# Patient Record
Sex: Male | Born: 1935 | Race: Black or African American | Hispanic: No | Marital: Married | Smoking: Current every day smoker
Health system: Southern US, Community
[De-identification: ages and names within clinical notes are randomized; demographics above are authoritative.]

## PROBLEM LIST (undated history)

## (undated) DIAGNOSIS — I10 Essential (primary) hypertension: Secondary | ICD-10-CM

## (undated) DIAGNOSIS — C3490 Malignant neoplasm of unspecified part of unspecified bronchus or lung: Secondary | ICD-10-CM

## (undated) DIAGNOSIS — Z923 Personal history of irradiation: Secondary | ICD-10-CM

## (undated) DIAGNOSIS — J449 Chronic obstructive pulmonary disease, unspecified: Secondary | ICD-10-CM

## (undated) DIAGNOSIS — B029 Zoster without complications: Secondary | ICD-10-CM

## (undated) DIAGNOSIS — Z720 Tobacco use: Secondary | ICD-10-CM

## (undated) DIAGNOSIS — D649 Anemia, unspecified: Secondary | ICD-10-CM

## (undated) HISTORY — DX: Personal history of irradiation: Z92.3

## (undated) HISTORY — PX: APPENDECTOMY: SHX54

## (undated) HISTORY — DX: Zoster without complications: B02.9

## (undated) HISTORY — DX: Tobacco use: Z72.0

## (undated) HISTORY — DX: Essential (primary) hypertension: I10

---

## 2010-06-16 DIAGNOSIS — B029 Zoster without complications: Secondary | ICD-10-CM

## 2010-06-16 HISTORY — DX: Zoster without complications: B02.9

## 2010-08-08 LAB — TSH: TSH: 1.36 u[IU]/mL (ref <=5.90)

## 2010-08-08 LAB — LIPID PANEL: Cholesterol: 194 mg/dL (ref 0–200)

## 2011-02-06 LAB — LIPID PANEL
Cholesterol: 172 mg/dL (ref 0–200)
HDL: 48 mg/dL (ref 35–70)
Triglycerides: 84 mg/dL (ref 40–160)

## 2011-08-14 LAB — LIPID PANEL
HDL: 56 mg/dL (ref 35–70)
LDL Cholesterol: 110 mg/dL

## 2011-08-14 LAB — TSH: TSH: 1.67 u[IU]/mL (ref <=5.90)

## 2011-10-02 ENCOUNTER — Ambulatory Visit: Payer: Self-pay | Admitting: Family Medicine

## 2011-11-20 ENCOUNTER — Ambulatory Visit: Payer: Self-pay | Admitting: Family Medicine

## 2011-12-09 ENCOUNTER — Encounter: Payer: Self-pay | Admitting: Family Medicine

## 2011-12-11 ENCOUNTER — Encounter: Payer: Self-pay | Admitting: Family Medicine

## 2011-12-11 ENCOUNTER — Ambulatory Visit (INDEPENDENT_AMBULATORY_CARE_PROVIDER_SITE_OTHER): Payer: Medicare Other | Admitting: Family Medicine

## 2011-12-11 VITALS — BP 162/95 | HR 106 | Temp 97.1°F | Resp 20 | Ht 68.0 in | Wt 142.4 lb

## 2011-12-11 DIAGNOSIS — Z0000 Encounter for general adult medical examination without abnormal findings: Secondary | ICD-10-CM

## 2011-12-11 DIAGNOSIS — I10 Essential (primary) hypertension: Secondary | ICD-10-CM

## 2011-12-11 DIAGNOSIS — K648 Other hemorrhoids: Secondary | ICD-10-CM

## 2011-12-11 DIAGNOSIS — B029 Zoster without complications: Secondary | ICD-10-CM

## 2011-12-11 DIAGNOSIS — Z720 Tobacco use: Secondary | ICD-10-CM | POA: Insufficient documentation

## 2011-12-11 DIAGNOSIS — Z8619 Personal history of other infectious and parasitic diseases: Secondary | ICD-10-CM

## 2011-12-11 LAB — PSA: PSA: 10.63 ng/mL — ABNORMAL HIGH (ref <=4.00)

## 2011-12-11 MED ORDER — ASPIRIN 81 MG PO TABS: 81.0000 mg | ORAL_TABLET | Freq: Every day | ORAL | Status: AC

## 2011-12-11 MED ORDER — AMLODIPINE BESYLATE 5 MG PO TABS: 5.0000 mg | ORAL_TABLET | Freq: Every day | ORAL | Status: DC

## 2011-12-11 MED ORDER — HYDROCHLOROTHIAZIDE 12.5 MG PO CAPS: 12.5000 mg | ORAL_CAPSULE | Freq: Every day | ORAL | Status: DC

## 2011-12-11 NOTE — Progress Notes (Signed)
  Subjective:    Patient ID: Cameron Lam, male    DOB: 1933/12/19, 78 y.o.   MRN: [id number]  HPI Patient presents for CPE.  Hypertension- Out of medications for 1 week.  Denies side effects.    H/O Herpes Zoster continues to have pain from post herpetic neuralgia.  Prolapsed hemorrhoids- excision in the past(more than 40 years ago) with return of hemorrhoids. Patient tapes anus closed to secure hemorrhoids.     Review of Systems  Constitutional: Negative for appetite change and fatigue.  Eyes: Negative for visual disturbance.  Respiratory: Negative for shortness of breath.   Cardiovascular: Negative for chest pain, palpitations and leg swelling.  Gastrointestinal: Negative for constipation.  Neurological: Negative for headaches.       Objective:   Physical Exam  Constitutional: He is oriented to person, place, and time. He appears well-developed and well-nourished.  HENT:  Head: Normocephalic and atraumatic.  Right Ear: External ear normal.  Left Ear: External ear normal.  Nose: Nose normal.  Mouth/Throat: Oropharynx is clear and moist.  Eyes: Conjunctivae and EOM are normal. Pupils are equal, round, and reactive to light.  Neck: Normal range of motion. Neck supple. No tracheal deviation present.  Cardiovascular: Normal rate, regular rhythm, normal heart sounds and intact distal pulses.   Pulmonary/Chest: Effort normal and breath sounds normal.  Abdominal: Soft. Normal appearance and bowel sounds are normal. There is no hepatosplenomegaly.  Genitourinary:       Patient declines rectal/prostate exam secondary to prolapsed hemorrhoids.  Musculoskeletal: Normal range of motion.  Neurological: He is alert and oriented to person, place, and time. He has normal strength. No cranial nerve deficit or sensory deficit.  Skin: Skin is warm and dry. Rash: post inflammatory hyperpigmentation (L) anterior thigh.  Psychiatric: He has a normal mood and affect.       Assessment & Plan:    1. Routine general medical examination at a health care facility  PSA  2. HTN (hypertension)  amLODipine (NORVASC) 5 MG tablet, aspirin 81 MG tablet, hydrochlorothiazide (MICROZIDE) 12.5 MG capsule  3. H/O herpes zoster    4. Shingles outbreak    5. Hypertension    6. Tobacco abuse  Encouraged tobacco cessation. Offered help when patient ready.  7. Hemorrhoid prolapse  Ambulatory referral to Gastroenterology   Follow up 1 month after patient resumes his BP medications. Encouraged adherence to prevent end organ damage and ASCVD/ASCAD.  Patient unable to wait to receive vaccinations.  Will administer Tdap and Pneumovax on follow up.

## 2011-12-17 ENCOUNTER — Other Ambulatory Visit: Payer: Self-pay | Admitting: Family Medicine

## 2011-12-17 DIAGNOSIS — R972 Elevated prostate specific antigen [PSA]: Secondary | ICD-10-CM

## 2011-12-17 NOTE — Progress Notes (Signed)
Quick Note:  Please call patient regarding elevated PSA and need for a Urology referral. Thanks. Referral already placed, ______

## 2012-01-08 ENCOUNTER — Ambulatory Visit: Payer: Medicare Other | Admitting: Family Medicine

## 2013-03-02 ENCOUNTER — Ambulatory Visit (INDEPENDENT_AMBULATORY_CARE_PROVIDER_SITE_OTHER): Payer: Medicare Other | Admitting: Family Medicine

## 2013-03-02 VITALS — BP 170/98 | HR 97 | Temp 98.1°F | Resp 22 | Ht 67.5 in | Wt 140.2 lb

## 2013-03-02 DIAGNOSIS — J449 Chronic obstructive pulmonary disease, unspecified: Secondary | ICD-10-CM | POA: Insufficient documentation

## 2013-03-02 DIAGNOSIS — J441 Chronic obstructive pulmonary disease with (acute) exacerbation: Secondary | ICD-10-CM

## 2013-03-02 DIAGNOSIS — I10 Essential (primary) hypertension: Secondary | ICD-10-CM

## 2013-03-02 MED ORDER — PREDNISONE 20 MG PO TABS: ORAL_TABLET | ORAL | Status: DC

## 2013-03-02 MED ORDER — HYDROCHLOROTHIAZIDE 12.5 MG PO CAPS: 12.5000 mg | ORAL_CAPSULE | Freq: Every day | ORAL | Status: DC

## 2013-03-02 MED ORDER — AMLODIPINE BESYLATE 5 MG PO TABS: 5.0000 mg | ORAL_TABLET | Freq: Every day | ORAL | Status: DC

## 2013-03-02 NOTE — Progress Notes (Signed)
79 yo smoker with shortness of breath for 1 week.  Also, he needs refill on hypertension medicine which ran out awhile ago.  He has been working out in the yard lately.  He manages three properties.  Objective: NAD HEENT:  Some cerumen buildup (patient  Refuses irrigation), otherwise poor dentition Neck:  Supple, no adenopathy Chest:  Few expiratory wheezes Ext: trace edema  Assessment:  COPD, acute exacerbation and hypertension, noncompliant

## 2013-03-02 NOTE — Patient Instructions (Addendum)

## 2013-03-10 ENCOUNTER — Telehealth: Payer: Self-pay

## 2013-03-10 DIAGNOSIS — J441 Chronic obstructive pulmonary disease with (acute) exacerbation: Secondary | ICD-10-CM

## 2013-03-10 NOTE — Telephone Encounter (Signed)
This is fine.  Please call this in for me since we are so busy this evening

## 2013-03-10 NOTE — Telephone Encounter (Signed)
Patients daughter gay is calling to see if her father can get an inhaler along with the other prescriptions that we gave him for copd please call her at [phone]

## 2013-03-10 NOTE — Telephone Encounter (Signed)
Please advise on inhaler.

## 2013-03-11 NOTE — Telephone Encounter (Signed)
Looks like provider approved refill but message sent to clerical. Please let daughter know when inhaler has been called in. Daughter requests [facility] on [address] and would like [place] to delete [facility] on [address]

## 2013-03-11 NOTE — Telephone Encounter (Signed)
Dr Milus Glazier has approved an inhaler, asked to send this in, however he did not indicate which inhaler. Please advise albuterol or other. Cameron Lam (pt with COPD)

## 2013-03-11 NOTE — Telephone Encounter (Signed)
I do not see where pt has been on an inhaler before.  There are multiple inhaled medications for COPD - can we clarify with pt or with Dr. Elbert Ewings?

## 2013-03-12 ENCOUNTER — Other Ambulatory Visit: Payer: Self-pay | Admitting: Family Medicine

## 2013-03-12 DIAGNOSIS — J441 Chronic obstructive pulmonary disease with (acute) exacerbation: Secondary | ICD-10-CM

## 2013-03-12 MED ORDER — ALBUTEROL SULFATE HFA 108 (90 BASE) MCG/ACT IN AERS: 2.0000 | INHALATION_SPRAY | Freq: Four times a day (QID) | RESPIRATORY_TRACT | Status: DC | PRN

## 2013-03-28 MED ORDER — ALBUTEROL SULFATE HFA 108 (90 BASE) MCG/ACT IN AERS: 2.0000 | INHALATION_SPRAY | Freq: Four times a day (QID) | RESPIRATORY_TRACT | Status: DC | PRN

## 2013-03-28 NOTE — Addendum Note (Signed)
Addended by: Elvina Sidle on: 03/28/2013 07:46 AM   Modules accepted: Orders

## 2013-04-02 NOTE — Telephone Encounter (Signed)
Albuterol inhaler was sent in by Dr Milus Glazier.

## 2013-04-03 ENCOUNTER — Encounter: Payer: Self-pay | Admitting: Family Medicine

## 2013-04-03 ENCOUNTER — Ambulatory Visit: Payer: Medicare Other

## 2013-04-03 ENCOUNTER — Ambulatory Visit (INDEPENDENT_AMBULATORY_CARE_PROVIDER_SITE_OTHER): Payer: Medicare Other | Admitting: Family Medicine

## 2013-04-03 VITALS — BP 132/72 | HR 74 | Temp 97.5°F | Resp 18 | Ht 67.5 in | Wt 138.0 lb

## 2013-04-03 DIAGNOSIS — R0602 Shortness of breath: Secondary | ICD-10-CM

## 2013-04-03 DIAGNOSIS — R222 Localized swelling, mass and lump, trunk: Secondary | ICD-10-CM

## 2013-04-03 DIAGNOSIS — R002 Palpitations: Secondary | ICD-10-CM

## 2013-04-03 DIAGNOSIS — R0600 Dyspnea, unspecified: Secondary | ICD-10-CM

## 2013-04-03 DIAGNOSIS — R0609 Other forms of dyspnea: Secondary | ICD-10-CM

## 2013-04-03 DIAGNOSIS — I10 Essential (primary) hypertension: Secondary | ICD-10-CM

## 2013-04-03 NOTE — Patient Instructions (Signed)
Herpes Zoster Virus Vaccine What is this medicine? HERPES ZOSTER VIRUS VACCINE (HUR peez ZOS ter vahy ruhs vak SEEN) is a vaccine. It is used to prevent shingles in adults 77 years old and over. This vaccine is not used to treat shingles or nerve pain from shingles. This medicine may be used for other purposes; ask your health care provider or pharmacist if you have questions. What should I tell my health care provider before I take this medicine? They need to know if you have any of these conditions: -cancer like leukemia or lymphoma -immune system problems or therapy -infection with fever -tuberculosis -an unusual or allergic reaction to vaccines, neomycin, gelatin, other medicines, foods, dyes, or preservatives -pregnant or trying to get pregnant -breast-feeding How should I use this medicine? This vaccine is for injection under the skin. It is given by a health care professional. Talk to your pediatrician regarding the use of this medicine in children. This medicine is not approved for use in children. Overdosage: If you think you have taken too much of this medicine contact a poison control center or emergency room at once. NOTE: This medicine is only for you. Do not share this medicine with others. What if I miss a dose? This does not apply. What may interact with this medicine? Do not take this medicine with any of the following medications: -adalimumab -anakinra -etanercept -infliximab -medicines to treat cancer -medicines that suppress your immune system This medicine may also interact with the following medications: -immunoglobulins -steroid medicines like prednisone or cortisone This list may not describe all possible interactions. Give your health care provider a list of all the medicines, herbs, non-prescription drugs, or dietary supplements you use. Also tell them if you smoke, drink alcohol, or use illegal drugs. Some items may interact with your medicine. What should I  watch for while using this medicine? Visit your doctor for regular check ups. This vaccine, like all vaccines, may not fully protect everyone. After receiving this vaccine it may be possible to pass chickenpox infection to others. Avoid people with immune system problems, pregnant women who have not had chickenpox, and newborns of women who have not had chickenpox. Talk to your doctor for more information. What side effects may I notice from receiving this medicine? Side effects that you should report to your doctor or health care professional as soon as possible: -allergic reactions like skin rash, itching or hives, swelling of the face, lips, or tongue -breathing problems -feeling faint or lightheaded, falls -fever, flu-like symptoms -pain, tingling, numbness in the hands or feet -swelling of the ankles, feet, hands -unusually weak or tired Side effects that usually do not require medical attention (report to your doctor or health care professional if they continue or are bothersome): -aches or pains -chickenpox-like rash -diarrhea -headache -loss of appetite -nausea, vomiting -redness, pain, swelling at site where injected -runny nose This list may not describe all possible side effects. Call your doctor for medical advice about side effects. You may report side effects to FDA at 1-800-FDA-1088. Where should I keep my medicine? This drug is given in a hospital or clinic and will not be stored at home. NOTE: This sheet is a summary. It may not cover all possible information. If you have questions about this medicine, talk to your doctor, pharmacist, or health care provider.  2013, Elsevier/Gold Standard. (03/28/2010 5:43:50 PM) Pneumococcal Vaccine, Polyvalent solution for injection What is this medicine? PNEUMOCOCCAL VACCINE, POLYVALENT (NEU mo KOK al vak SEEN, pol ee  VEY luhnt) is a vaccine to prevent pneumococcus bacteria infection. These bacteria are a major cause of ear infections,  Strep throat infections, and serious pneumonia, meningitis, or blood infections worldwide. These vaccines help the body to produce antibodies (protective substances) that help your body defend against these bacteria. This vaccine is recommended for people 29 years of age and older with health problems. It is also recommended for all adults over 25 years old. This vaccine will not treat an infection. This medicine may be used for other purposes; ask your health care provider or pharmacist if you have questions. What should I tell my health care provider before I take this medicine? They need to know if you have any of these conditions: -bleeding problems -bone marrow or organ transplant -cancer, Hodgkin's disease -fever -infection -immune system problems -low platelet count in the blood -seizures -an unusual or allergic reaction to pneumococcal vaccine, diphtheria toxoid, other vaccines, latex, other medicines, foods, dyes, or preservatives -pregnant or trying to get pregnant -breast-feeding How should I use this medicine? This vaccine is for injection into a muscle or under the skin. It is given by a health care professional. A copy of Vaccine Information Statements will be given before each vaccination. Read this sheet carefully each time. The sheet may change frequently. Talk to your pediatrician regarding the use of this medicine in children. While this drug may be prescribed for children as young as 20 years of age for selected conditions, precautions do apply. Overdosage: If you think you have taken too much of this medicine contact a poison control center or emergency room at once. NOTE: This medicine is only for you. Do not share this medicine with others. What if I miss a dose? It is important not to miss your dose. Call your doctor or health care professional if you are unable to keep an appointment. What may interact with this medicine? -medicines for cancer chemotherapy -medicines  that suppress your immune function -medicines that treat or prevent blood clots like warfarin, enoxaparin, and dalteparin -steroid medicines like prednisone or cortisone This list may not describe all possible interactions. Give your health care provider a list of all the medicines, herbs, non-prescription drugs, or dietary supplements you use. Also tell them if you smoke, drink alcohol, or use illegal drugs. Some items may interact with your medicine. What should I watch for while using this medicine? Mild fever and pain should go away in 3 days or less. Report any unusual symptoms to your doctor or health care professional. What side effects may I notice from receiving this medicine? Side effects that you should report to your doctor or health care professional as soon as possible: -allergic reactions like skin rash, itching or hives, swelling of the face, lips, or tongue -breathing problems -confused -fever over 102 degrees F -pain, tingling, numbness in the hands or feet -seizures -unusual bleeding or bruising -unusual muscle weakness Side effects that usually do not require medical attention (report to your doctor or health care professional if they continue or are bothersome): -aches and pains -diarrhea -fever of 102 degrees F or less -headache -irritable -loss of appetite -pain, tender at site where injected -trouble sleeping This list may not describe all possible side effects. Call your doctor for medical advice about side effects. You may report side effects to FDA at 1-800-FDA-1088. Where should I keep my medicine? This does not apply. This vaccine is given in a clinic, pharmacy, doctor's office, or other health care setting and will  not be stored at home. NOTE: This sheet is a summary. It may not cover all possible information. If you have questions about this medicine, talk to your doctor, pharmacist, or health care provider.  2012, Elsevier/Gold Standard. (05/15/2008  2:32:37 PM)Tetanus and Diphtheria Vaccine Your caregiver has suggested that you receive an immunization to prevent tetanus (lockjaw) and diphtheria. Tetanus and diphtheria are serious and deadly infectious diseases of the past that have been nearly wiped out by modern immunizations. Td or DT vaccines (shots) are the immunizations given to help prevent these illnesses. Td is the medical term for a standard tetanus dose, small diphtheria dose. DT means both in standard doses. ABOUT THE DISEASES Tetanus (lockjaw) and diphtheria are serious diseases. Tetanus is caused by a germ that lives in the soil. It enters the body through a cut or wound, often caused by a nail or broken piece of glass. You cannot catch tetanus from another person. Diphtheria spreads when germs pass from an infected person to the nose or throat of others. Tetanus causes serious, painful spasms of all muscles. It can lead to:  "Locking" of the muscles of the jaw and throat, so the patient cannot open his or her mouth or swallow.  Damage to the heart muscle. Diphtheria causes a thick coating in the nose, throat, or airway. It can lead to:  Breathing problems.  Kidney problems.  Heart failure.  Paralysis.  Death. ABOUT THE VACCINES  A vaccine is a shot (immunization) that can help prevent a disease. Vaccines have helped lower the rates of getting certain diseases. If people stopped getting vaccinated, more people would develop illnesses. These vaccines can be used in three ways:  As catch-up for people who did not get all their doses when they were children.  As a booster dose every 10 years.  For protection against tetanus infection, after a wound. Benefits of the vaccines Vaccination is the best way to protect against tetanus and diphtheria. Because of vaccination, there are fewer cases of these diseases. Cases are rare in children because most get a routine vaccination with DTP (Diphtheria, Tetanus, and Pertussis),  DTaP (Diphtheria, Tetanus, and acellular Pertussis), or DT (Diphtheria and Tetanus) vaccines. There would be many more cases if we stopped vaccinating people. Tetanus kills about 1 in 5 people who are infected. WHEN SHOULD YOU GET TD VACCINE?  Td is made for people 64 years of age and older.  People who have not gotten at least 3 doses of any tetanus and diphtheria vaccine (DTP, DTaP or DT) during their lifetime should do so using Td. After a person gets the third dose, a Td dose is needed every 10 years all through life. This is because protection fades over time. Booster shots are needed every 10 years.  Other vaccines may be given at the same time as Td. You may not know today whether your immunizations are current. The vaccine given today is to protect you from your next cut or injury. It does not offer protection for the current injury. An immune globulin injection may be given, if protection is needed immediately. Check with your caregiver later regarding your immunization status. Tell your caregiver if the person getting the vaccine:  Has ever had a serious allergic reaction or other problem with Td, or any other tetanus and diphtheria vaccine (DTP, DTaP, or DT). People who have had a serious allergic reaction should not receive the vaccine.  Has epilepsy or another nervous system illness.  Has had Guillain Irven Easterly  Syndrome (GBS) in the past.  Now has a moderate or severe illness.  Is pregnant.  If you are not sure, ask your caregiver. WHAT ARE THE RISKS FROM TD VACCINE?  As with any medicine, there are very small risks that serious problems, even death, could occur after getting a vaccine. However, the risk of a serious side effect from the vaccine is almost zero.  The risks from the vaccine are much smaller than the risks from the diseases, if people stopped getting vaccinated. Both diseases can cause serious health problems, which are prevented by the vaccine.  Almost all people  who get Td have no problems from it. Mild problems If mild problems occur, they usually start within hours to a day or two after vaccination. They may last 1-2 days:  Soreness, redness, or swelling where the shot was given.  Headache or tiredness.  Occasionally, a low grade fever. These problems can be worse in adults who get Td vaccine very often. Non-aspirin medicines may be used to reduce soreness. Severe problems These problems happen very rarely:  Serious allergic reaction (at most, occurs in 1 in 1 million vaccinated persons). This occurs almost immediately, and is treatable with medicines. Signs of a serious allergic reaction include:  Difficulty breathing.  Hoarseness or wheezing.  Hives.  Dizziness.  Deep, aching pain and muscle wasting in upper arm(s). Overall, the benefits to you and your family from these vaccines are far greater than the risk. WHAT TO DO IF THERE IS A SERIOUS REACTION:  Call a caregiver or get the person to a doctor or emergency room right away.  Write down what happened, the date and time it happened, and tell your caregiver.  Ask your caregiver to file a Vaccine Adverse Event Report form or call, toll-free: [phone] If you want to learn more about this vaccine, ask your caregiver. She/he can give you the vaccine package insert or suggest other sources of information. Also, the [employer] gives compensation (payment) for persons thought to be injured by vaccines. For details call, toll-free: [phone]. Document Released: 10/06/2000 Document Revised: 01/01/2012 Document Reviewed: 08/26/2009 [facility] Patient Information 2014 [place], [place]. Health Maintenance, Males A healthy lifestyle and preventative care can promote health and wellness.  Maintain regular health, dental, and eye exams.  Eat a healthy diet. Foods like vegetables, fruits, whole grains, low-fat dairy products, and lean protein foods  contain the nutrients you need without too many calories. Decrease your intake of foods high in solid fats, added sugars, and salt. Get information about a proper diet from your caregiver, if necessary.  Regular physical exercise is one of the most important things you can do for your health. Most adults should get at least 150 minutes of moderate-intensity exercise (any activity that increases your heart rate and causes you to sweat) each week. In addition, most adults need muscle-strengthening exercises on 2 or more days a week.   Maintain a healthy weight. The body mass index (BMI) is a screening tool to identify possible weight problems. It provides an estimate of body fat based on height and weight. Your caregiver can help determine your BMI, and can help you achieve or maintain a healthy weight. For adults 20 years and older:  A BMI below 18.5 is considered underweight.  A BMI of 18.5 to 24.9 is normal.  A BMI of 25 to 29.9 is considered overweight.  A BMI of 30 and above is considered obese.  Maintain normal blood  lipids and cholesterol by exercising and minimizing your intake of saturated fat. Eat a balanced diet with plenty of fruits and vegetables. Blood tests for lipids and cholesterol should begin at age 2 and be repeated every 5 years. If your lipid or cholesterol levels are high, you are over 50, or you are a high risk for heart disease, you may need your cholesterol levels checked more frequently.Ongoing high lipid and cholesterol levels should be treated with medicines, if diet and exercise are not effective.  If you smoke, find out from your caregiver how to quit. If you do not use tobacco, do not start.  If you choose to drink alcohol, do not exceed 2 drinks per day. One drink is considered to be 12 ounces (355 mL) of beer, 5 ounces (148 mL) of wine, or 1.5 ounces (44 mL) of liquor.  Avoid use of street drugs. Do not share needles with anyone. Ask for help if you need support  or instructions about stopping the use of drugs.  High blood pressure causes heart disease and increases the risk of stroke. Blood pressure should be checked at least every 1 to 2 years. Ongoing high blood pressure should be treated with medicines if weight loss and exercise are not effective.  If you are 60 to 77 years old, ask your caregiver if you should take aspirin to prevent heart disease.  Diabetes screening involves taking a blood sample to check your fasting blood sugar level. This should be done once every 3 years, after age 68, if you are within normal weight and without risk factors for diabetes. Testing should be considered at a younger age or be carried out more frequently if you are overweight and have at least 1 risk factor for diabetes.  Colorectal cancer can be detected and often prevented. Most routine colorectal cancer screening begins at the age of 44 and continues through age 55. However, your caregiver may recommend screening at an earlier age if you have risk factors for colon cancer. On a yearly basis, your caregiver may provide home test kits to check for hidden blood in the stool. Use of a small camera at the end of a tube, to directly examine the colon (sigmoidoscopy or colonoscopy), can detect the earliest forms of colorectal cancer. Talk to your caregiver about this at age 62, when routine screening begins. Direct examination of the colon should be repeated every 5 to 10 years through age 40, unless early forms of pre-cancerous polyps or small growths are found.  Hepatitis C blood testing is recommended for all people born from [birth year] through 1963 and any individual with known risks for hepatitis C.  Healthy men should no longer receive prostate-specific antigen (PSA) blood tests as part of routine cancer screening. Consult with your caregiver about prostate cancer screening.  Testicular cancer screening is not recommended for adolescents or adult males who have no symptoms.  Screening includes self-exam, caregiver exam, and other screening tests. Consult with your caregiver about any symptoms you have or any concerns you have about testicular cancer.  Practice safe sex. Use condoms and avoid high-risk sexual practices to reduce the spread of sexually transmitted infections (STIs).  Use sunscreen with a sun protection factor (SPF) of 30 or greater. Apply sunscreen liberally and repeatedly throughout the day. You should seek shade when your shadow is shorter than you. Protect yourself by wearing long sleeves, pants, a wide-brimmed hat, and sunglasses year round, whenever you are outdoors.  Notify your caregiver of new  moles or changes in moles, especially if there is a change in shape or color. Also notify your caregiver if a mole is larger than the size of a pencil eraser.  A one-time screening for abdominal aortic aneurysm (AAA) and surgical repair of large AAAs by sound wave imaging (ultrasonography) is recommended for ages 34 to 28 years who are current or former smokers.  Stay current with your immunizations. Document Released: 04/06/2008 Document Revised: 01/01/2012 Document Reviewed: 03/06/2011 [facility] Patient Information 2014 [place], [place].

## 2013-04-03 NOTE — Progress Notes (Signed)
79 yo man here for follow up on shortness of breath and hypertension.  He was seen a week ago and antihypertensives were restarted and he was given a short course of prednisone.  Health Maintenance issues were raised:  Colonoscopy, tetanus immunization, pneumonia prophylaxis, and shingles prevention   Objective:  NAD. Thin man in no apparent distress  HEENT:  Unremarkable Neck:  Supple without adenopathy or thyromegaly Chest:  Few expiratory wheezes, decreased breath sounds throughout Heart:  Irreg, irreg with average rate of 80.  No murmur or rub Ext:  No edema.  EKG:  Pac's, rate around 100, no acute changes.  Discussed over the phone with Dr. Hillis Range [facility] reading (PRIMARY) by  Dr. Milus Glazier:  Left mediastinal mass  Assessment:  Possible lung ca, shortness of breath, sinus tachycardia Plan:  CT of lung tomorrow with labs ordered Shortness of breath - Plan: EKG 12-Lead, DG Chest 2 View, CT Chest Wo Contrast, CANCELED: Comprehensive metabolic panel  Dyspnea - Plan: CT Chest Wo Contrast  Palpitations - Plan: CT Chest Wo Contrast, Comprehensive metabolic panel, CBC with Differential, Sedimentation Rate  HTN (hypertension) - Plan: CT Chest Wo Contrast, Comprehensive metabolic panel, CBC with Differential, Sedimentation Rate  Elvina Sidle, Md.

## 2013-04-04 ENCOUNTER — Telehealth: Payer: Self-pay | Admitting: Family Medicine

## 2013-04-04 ENCOUNTER — Other Ambulatory Visit: Payer: Self-pay | Admitting: Family Medicine

## 2013-04-04 ENCOUNTER — Encounter (HOSPITAL_COMMUNITY): Payer: Self-pay

## 2013-04-04 ENCOUNTER — Telehealth: Payer: Self-pay

## 2013-04-04 ENCOUNTER — Ambulatory Visit (HOSPITAL_COMMUNITY)
Admission: RE | Admit: 2013-04-04 | Discharge: 2013-04-04 | Disposition: A | Discharge status: Home / Self Care | Payer: Medicare Other | Source: Ambulatory Visit | Attending: Family Medicine | Admitting: Family Medicine

## 2013-04-04 DIAGNOSIS — R002 Palpitations: Secondary | ICD-10-CM

## 2013-04-04 DIAGNOSIS — R0600 Dyspnea, unspecified: Secondary | ICD-10-CM

## 2013-04-04 DIAGNOSIS — R0602 Shortness of breath: Secondary | ICD-10-CM

## 2013-04-04 DIAGNOSIS — I10 Essential (primary) hypertension: Secondary | ICD-10-CM

## 2013-04-04 LAB — COMPREHENSIVE METABOLIC PANEL
ALT: 9 U/L (ref 0–53)
AST: 13 U/L (ref 0–37)
Albumin: 3.3 g/dL — ABNORMAL LOW (ref 3.5–5.2)
Alkaline Phosphatase: 78 U/L (ref 39–117)
BUN: 16 mg/dL (ref 6–23)
CO2: 25 mEq/L (ref 19–32)
Calcium: 9 mg/dL (ref 8.4–10.5)
Chloride: 97 mEq/L (ref 96–112)
Creatinine, Ser: 0.81 mg/dL (ref 0.50–1.35)
GFR calc Af Amer: >90 mL/min (ref >90)
GFR calc non Af Amer: 84 mL/min — ABNORMAL LOW (ref >90)
Glucose, Bld: 81 mg/dL (ref 70–99)
Potassium: 3.3 mEq/L — ABNORMAL LOW (ref 3.5–5.1)
Sodium: 133 mEq/L — ABNORMAL LOW (ref 135–145)
Total Bilirubin: 0.3 mg/dL (ref 0.3–1.2)
Total Protein: 6.7 g/dL (ref 6.0–8.3)

## 2013-04-04 LAB — CBC WITH DIFFERENTIAL/PLATELET
Basophils Absolute: 0 10*3/uL (ref 0.0–0.1)
Basophils Relative: 0 % (ref 0–1)
Eosinophils Absolute: 0 10*3/uL (ref 0.0–0.7)
Eosinophils Relative: 0 % (ref 0–5)
HCT: 22.5 % — ABNORMAL LOW (ref 39.0–52.0)
Hemoglobin: 6.2 g/dL — CL (ref 13.0–17.0)
Lymphocytes Relative: 48 % — ABNORMAL HIGH (ref 12–46)
Lymphs Abs: 9.8 10*3/uL — ABNORMAL HIGH (ref 0.7–4.0)
MCH: 16.1 pg — ABNORMAL LOW (ref 26.0–34.0)
MCHC: 27.6 g/dL — ABNORMAL LOW (ref 30.0–36.0)
MCV: 58.4 fL — ABNORMAL LOW (ref 78.0–100.0)
Monocytes Absolute: 2 10*3/uL — ABNORMAL HIGH (ref 0.1–1.0)
Monocytes Relative: 10 % (ref 3–12)
Neutro Abs: 8.6 10*3/uL — ABNORMAL HIGH (ref 1.7–7.7)
Neutrophils Relative %: 42 % — ABNORMAL LOW (ref 43–77)
Platelets: 390 10*3/uL (ref 150–400)
RBC: 3.85 MIL/uL — ABNORMAL LOW (ref 4.22–5.81)
RDW: 19.8 % — ABNORMAL HIGH (ref 11.5–15.5)
WBC: 20.4 10*3/uL — ABNORMAL HIGH (ref 4.0–10.5)

## 2013-04-04 LAB — SEDIMENTATION RATE: Sed Rate: 23 mm/hr — ABNORMAL HIGH (ref 0–16)

## 2013-04-04 MED ORDER — IOHEXOL 300 MG/ML  SOLN
80.0000 mL | Freq: Once | INTRAMUSCULAR | Status: AC | PRN
  Administered 2013-04-04: 80 mL via INTRAVENOUS

## 2013-04-04 NOTE — Telephone Encounter (Signed)
Pts daughter Ayansh Feutz would like to have someone call her regarding pt's Ct scan results. Best# [phone]

## 2013-04-04 NOTE — Telephone Encounter (Signed)
[facility] lab) called regarding critical lab. Hemoglobin 6.2. Dr. Milus Glazier notified and gave verbal to call paitient. He needs to RTC or go to ED due to hemoglobin and may have to have transfusion . Called patient but no answer. Unable to leave message. [facility] and spoke with Huntley Dec. Patient left awaiting CMP results. He is going back for CT and will have patient call us.

## 2013-04-04 NOTE — Telephone Encounter (Signed)
Patient in radiology for CT/chest and it was ordered without contrast. Radiologist from CXR report suggested with contrast. Spoke with Dr. Milus Glazier and gave verbal for with contrast. Megan at CT notified and did not need to put order in.

## 2013-04-05 ENCOUNTER — Encounter (HOSPITAL_COMMUNITY): Payer: Self-pay | Admitting: Emergency Medicine

## 2013-04-05 ENCOUNTER — Inpatient Hospital Stay (HOSPITAL_COMMUNITY)
Admission: EM | Admit: 2013-04-05 | Discharge: 2013-04-14 | DRG: 180 | Disposition: A | Discharge status: Home / Self Care | Payer: Medicare Other | Attending: Internal Medicine | Admitting: Internal Medicine

## 2013-04-05 DIAGNOSIS — E46 Unspecified protein-calorie malnutrition: Secondary | ICD-10-CM | POA: Diagnosis present

## 2013-04-05 DIAGNOSIS — D72829 Elevated white blood cell count, unspecified: Secondary | ICD-10-CM

## 2013-04-05 DIAGNOSIS — R222 Localized swelling, mass and lump, trunk: Secondary | ICD-10-CM

## 2013-04-05 DIAGNOSIS — C7889 Secondary malignant neoplasm of other digestive organs: Secondary | ICD-10-CM | POA: Diagnosis present

## 2013-04-05 DIAGNOSIS — E871 Hypo-osmolality and hyponatremia: Secondary | ICD-10-CM | POA: Diagnosis not present

## 2013-04-05 DIAGNOSIS — J189 Pneumonia, unspecified organism: Secondary | ICD-10-CM

## 2013-04-05 DIAGNOSIS — R918 Other nonspecific abnormal finding of lung field: Secondary | ICD-10-CM

## 2013-04-05 DIAGNOSIS — J4489 Other specified chronic obstructive pulmonary disease: Secondary | ICD-10-CM | POA: Diagnosis present

## 2013-04-05 DIAGNOSIS — I214 Non-ST elevation (NSTEMI) myocardial infarction: Secondary | ICD-10-CM

## 2013-04-05 DIAGNOSIS — D630 Anemia in neoplastic disease: Secondary | ICD-10-CM | POA: Diagnosis present

## 2013-04-05 DIAGNOSIS — Z79899 Other long term (current) drug therapy: Secondary | ICD-10-CM

## 2013-04-05 DIAGNOSIS — J449 Chronic obstructive pulmonary disease, unspecified: Secondary | ICD-10-CM

## 2013-04-05 DIAGNOSIS — IMO0002 Reserved for concepts with insufficient information to code with codable children: Secondary | ICD-10-CM

## 2013-04-05 DIAGNOSIS — F17200 Nicotine dependence, unspecified, uncomplicated: Secondary | ICD-10-CM

## 2013-04-05 DIAGNOSIS — D509 Iron deficiency anemia, unspecified: Secondary | ICD-10-CM

## 2013-04-05 DIAGNOSIS — C3490 Malignant neoplasm of unspecified part of unspecified bronchus or lung: Principal | ICD-10-CM | POA: Diagnosis present

## 2013-04-05 DIAGNOSIS — C787 Secondary malignant neoplasm of liver and intrahepatic bile duct: Secondary | ICD-10-CM | POA: Diagnosis present

## 2013-04-05 DIAGNOSIS — C3492 Malignant neoplasm of unspecified part of left bronchus or lung: Secondary | ICD-10-CM

## 2013-04-05 DIAGNOSIS — I10 Essential (primary) hypertension: Secondary | ICD-10-CM

## 2013-04-05 DIAGNOSIS — Z720 Tobacco use: Secondary | ICD-10-CM

## 2013-04-05 DIAGNOSIS — D649 Anemia, unspecified: Secondary | ICD-10-CM

## 2013-04-05 LAB — URINALYSIS, ROUTINE W REFLEX MICROSCOPIC
Bilirubin Urine: NEGATIVE
Glucose, UA: NEGATIVE mg/dL
Hgb urine dipstick: NEGATIVE
Specific Gravity, Urine: 1.025 (ref 1.005–1.030)
pH: 5.5 (ref 5.0–8.0)

## 2013-04-05 LAB — CBC
HCT: 21.1 % — ABNORMAL LOW (ref 39.0–52.0)
MCV: 58.8 fL — ABNORMAL LOW (ref 78.0–100.0)
Platelets: 389 10*3/uL (ref 150–400)
RBC: 3.59 MIL/uL — ABNORMAL LOW (ref 4.22–5.81)
RDW: 19.5 % — ABNORMAL HIGH (ref 11.5–15.5)
WBC: 23.8 10*3/uL — ABNORMAL HIGH (ref 4.0–10.5)

## 2013-04-05 LAB — PROTIME-INR
INR: 1.11 (ref 0.00–1.49)
Prothrombin Time: 14.2 seconds (ref 11.6–15.2)

## 2013-04-05 LAB — BASIC METABOLIC PANEL
CO2: 25 mEq/L (ref 19–32)
Chloride: 99 mEq/L (ref 96–112)
Creatinine, Ser: 0.79 mg/dL (ref 0.50–1.35)
GFR calc Af Amer: >90 mL/min (ref >90)
Potassium: 4.7 mEq/L (ref 3.5–5.1)

## 2013-04-05 LAB — RETICULOCYTES
RBC.: 3.37 MIL/uL — ABNORMAL LOW (ref 4.22–5.81)
Retic Count, Absolute: 94.4 10*3/uL (ref 19.0–186.0)
Retic Ct Pct: 2.8 % (ref 0.4–3.1)

## 2013-04-05 LAB — ABO/RH: ABO/RH(D): O POS

## 2013-04-05 LAB — TROPONIN I: Troponin I: <0.3 ng/mL (ref <0.30)

## 2013-04-05 LAB — PREPARE RBC (CROSSMATCH)

## 2013-04-05 LAB — OCCULT BLOOD, POC DEVICE: Fecal Occult Bld: NEGATIVE

## 2013-04-05 MED ORDER — VANCOMYCIN HCL IN DEXTROSE 750-5 MG/150ML-% IV SOLN
750.0000 mg | Freq: Two times a day (BID) | INTRAVENOUS | Status: DC
  Administered 2013-04-05 – 2013-04-07 (×4): 750 mg via INTRAVENOUS
  Filled 2013-04-05 (×6): qty 150

## 2013-04-05 MED ORDER — ALBUTEROL SULFATE HFA 108 (90 BASE) MCG/ACT IN AERS
2.0000 | INHALATION_SPRAY | Freq: Four times a day (QID) | RESPIRATORY_TRACT | Status: DC | PRN
  Filled 2013-04-05: qty 6.7

## 2013-04-05 MED ORDER — SODIUM CHLORIDE 0.9 % IJ SOLN
3.0000 mL | Freq: Two times a day (BID) | INTRAMUSCULAR | Status: DC
  Administered 2013-04-08 – 2013-04-12 (×3): 3 mL via INTRAVENOUS

## 2013-04-05 MED ORDER — SODIUM CHLORIDE 0.9 % IJ SOLN
3.0000 mL | Freq: Two times a day (BID) | INTRAMUSCULAR | Status: DC
  Administered 2013-04-07 – 2013-04-14 (×8): 3 mL via INTRAVENOUS

## 2013-04-05 MED ORDER — SODIUM CHLORIDE 0.9 % IJ SOLN: 3.0000 mL | INTRAMUSCULAR | Status: DC | PRN

## 2013-04-05 MED ORDER — PIPERACILLIN-TAZOBACTAM 3.375 G IVPB
3.3750 g | Freq: Three times a day (TID) | INTRAVENOUS | Status: DC
  Administered 2013-04-06 – 2013-04-07 (×5): 3.375 g via INTRAVENOUS
  Filled 2013-04-05 (×5): qty 50

## 2013-04-05 MED ORDER — PIPERACILLIN-TAZOBACTAM 3.375 G IVPB 30 MIN
3.3750 g | Freq: Once | INTRAVENOUS | Status: AC
  Administered 2013-04-05: 3.375 g via INTRAVENOUS
  Filled 2013-04-05: qty 50

## 2013-04-05 MED ORDER — SODIUM CHLORIDE 0.9 % IV SOLN
250.0000 mL | INTRAVENOUS | Status: DC | PRN
  Administered 2013-04-06: 250 mL via INTRAVENOUS

## 2013-04-05 MED ORDER — SODIUM CHLORIDE 0.9 % IV BOLUS (SEPSIS)
1000.0000 mL | Freq: Once | INTRAVENOUS | Status: AC
  Administered 2013-04-05: 1000 mL via INTRAVENOUS

## 2013-04-05 NOTE — Progress Notes (Signed)
ANTIBIOTIC CONSULT NOTE - INITIAL  Pharmacy Consult for Vancomycin/Zosyn Indication: rule out sepsis  No Known Allergies  Patient Measurements: Height: 5\' 7"  (170.2 cm) Weight: 146 lb (66.225 kg) IBW/kg (Calculated) : 66.1   Vital Signs: Temp: 99.9 F (37.7 C) (06/14 1824) Temp src: Oral (06/14 1824) BP: 165/74 mmHg (06/14 1824) Pulse Rate: 104 (06/14 1824) Intake/Output from previous day:   Intake/Output from this shift:    Labs:  Recent Labs  04/04/13 1303 04/05/13 1550  WBC 20.4* 23.8*  HGB 6.2* 5.6*  PLT 390 389  CREATININE 0.81 0.79   Estimated Creatinine Clearance: 73.4 ml/min (by C-G formula based on Cr of 0.79). No results found for this basename: VANCOTROUGH, VANCOPEAK, VANCORANDOM, GENTTROUGH, GENTPEAK, GENTRANDOM, TOBRATROUGH, TOBRAPEAK, TOBRARND, AMIKACINPEAK, AMIKACINTROU, AMIKACIN,  in the last 72 hours   Microbiology: No results found for this or any previous visit (from the past 720 hour(s)).  Medical History: Past Medical History  Diagnosis Date  . Shingles outbreak 06/16/10    Lt anterior and postior thigh/buttocks  . Hypertension   . Tobacco abuse      Assessment: 55 yoM with long history of smoking found to have recent lung mass presenting with s/sx's of SIRS, to begin empiric vancomycin and zosyn for r/o sepsis.  SCr 0.79, CrCl~ 80 ml/min (normalized), ~73 ml/min (CG)  WBC 23.8  Tmax 99.9  Goal of Therapy:  Vancomycin trough level 15-20 mcg/ml  Plan:   Zosyn 3.375g IV q8h (4 hour infusion time).  First dose scheduled to be given NOW over 30 minute infusion.  Vancomycin 750 mg IV q12h.  F/u SCr, trough, and culture results.  Clance Boll 04/05/2013,6:39 PM

## 2013-04-05 NOTE — ED Notes (Signed)
Pt was sent here for hemoglobin that is "slowly falling".  Was sent here from UC by Dr. Milus Glazier.  Denies any complaints.  Denies fatigue/weakness.

## 2013-04-05 NOTE — H&P (Signed)
[facility] History and Physical  Bingham Millette. BMW:[id number] DOB: 1934/10/18 DOA: 04/05/2013  Referring physician: Dr. Silverio Lay PCP: No PCP Per Patient  Specialists: none  Chief Complaint: Weakness and SOB with activity  HPI: Cameron Lam. is a 79 y.o. male  With history of tobacco use since he was 79 years old, HTN, and recent lung mass (currently awaiting further work up with oncologist).  That presented to the ED after radiologist recommended patient come to the ED due to lab work showing a low hemoglobin.  Patient and family reports that one month ago he went to urgent care due to SOB and further work up would reveal an new lung mass.  He subsequently went to [facility] yesterday for more tests and blood work and presents today complaining of the above listed CC.  The problem has been constant and not improving.  Nothing he is aware of makes it better but movement or lifting anything makes it worse.   We were consulted for admission evaluation and recommendations regarding anemia. In ED patient was fecal occult blood negative.  Review of Systems: 10 point review of system reviewed and negative unless otherwise mentioned above.  Past Medical History  Diagnosis Date  . Shingles outbreak 06/16/10    Lt anterior and postior thigh/buttocks  . Hypertension   . Tobacco abuse    No past surgical history on file. Social History:  reports that he has been smoking Cigarettes.  He has a 90 pack-year smoking history. He does not have any smokeless tobacco history on file. He reports that he does not drink alcohol or use illicit drugs. Lives with family  Can patient participate in ADLs? Limited currently  No Known Allergies  No family history on file.  None reported.  Prior to Admission medications   Medication Sig Start Date End Date Taking? Authorizing Provider  albuterol (PROVENTIL HFA;VENTOLIN HFA) 108 (90 BASE) MCG/ACT inhaler Inhale 2 puffs into the lungs every 6 (six)  hours as needed for wheezing. 03/28/13  Yes Elvina Sidle, MD  amLODipine (NORVASC) 5 MG tablet Take 1 tablet (5 mg total) by mouth daily. 03/02/13  Yes Elvina Sidle, MD  aspirin 81 MG tablet Take 1 tablet (81 mg total) by mouth daily. 12/11/11  Yes Dois Davenport, MD  hydrochlorothiazide (MICROZIDE) 12.5 MG capsule Take 1 capsule (12.5 mg total) by mouth daily. 03/02/13  Yes Elvina Sidle, MD   Physical Exam: Filed Vitals:   04/05/13 1434 04/05/13 1521 04/05/13 1702  BP: 151/68 142/73 160/77  Pulse: 103    Temp: 99.6 F (37.6 C)    TempSrc: Oral    Resp: 18 25 26   SpO2: 90% 92% 92%     General:  Pt in NAD, alert and awake. Looks tired  Eyes: EOMI, conjunctival pallor  ENT: normal exterior appearance  Neck: supple, no goiter  Cardiovascular: RRR, no MRG  Respiratory: CTA BL, no wheezes  Abdomen: soft, NT, ND  Skin: warm and dry  Musculoskeletal: no clubbing  Psychiatric: mood and affect appropriate  Neurologic: answers questions appropriately, moves all extremities, no facial asymmetry  Labs on Admission:  Basic Metabolic Panel:  Recent Labs Lab 04/04/13 1303 04/05/13 1550  NA 133* 133*  K 3.3* 4.7  CL 97 99  CO2 25 25  GLUCOSE 81 97  BUN 16 16  CREATININE 0.81 0.79  CALCIUM 9.0 8.7   Liver Function Tests:  Recent Labs Lab 04/04/13 1303  AST 13  ALT 9  ALKPHOS 78  BILITOT 0.3  PROT 6.7  ALBUMIN 3.3*   No results found for this basename: LIPASE, AMYLASE,  in the last 168 hours No results found for this basename: AMMONIA,  in the last 168 hours CBC:  Recent Labs Lab 04/04/13 1303 04/05/13 1550  WBC 20.4* 23.8*  NEUTROABS 8.6*  --   HGB 6.2* 5.6*  HCT 22.5* 21.1*  MCV 58.4* 58.8*  PLT 390 389   Cardiac Enzymes:  Recent Labs Lab 04/05/13 1550  TROPONINI <0.30    BNP (last 3 results) No results found for this basename: PROBNP,  in the last 8760 hours CBG: No results found for this basename: GLUCAP,  in the last 168  hours  Radiological Exams on Admission: Ct Chest W Contrast  04/04/2013   *RADIOLOGY REPORT*  Clinical Data: Palpitations and dyspnea  CT CHEST WITH CONTRAST  Technique:  Multidetector CT imaging of the chest was performed following the standard protocol during bolus administration of intravenous contrast.  Contrast: 80mL OMNIPAQUE IOHEXOL 300 MG/ML  SOLN  Comparison: None.  Findings: There is no pleural effusion identified.  Severe changes of  COPD/emphysema identified.  There is a large central obstructing mass occluding the right lower lobe airway.  This measures approximately 5.6 x 6.7 x 6.0 cm.  Central areas of low attenuation are noted suggestive of necrosis.   Endobronchial extension and occlusion of this tumor is noted, image 34/series 3. There is postobstructive consolidation of the much of the left lower lobe.  Spiculated right upper lobe perihilar lesion measures 1.9 cm. There is a right upper lobe spiculated lesion measuring 1.1 cm, image 31/series 3.  Normal heart size.  No pericardial effusion.  The subcarinal lymph node is enlarged measuring 2.5 cm, image 33/series 2.  Limited imaging through the upper abdomen shows an indeterminant low attenuation lesion in the left hepatic lobe measuring 1.9 cm, image 60/series 2.  Within the right hepatic lobe there is an indeterminate lesion measuring 2.3 cm, image 56/series 2.  Review of the visualized bony structures shows no aggressive lytic or sclerotic bone lesions.  IMPRESSION:  1.  Central obstructing mass within the left lower lobe is present. There are several, suspicious spiculated lesions within the right lung which are concerning for metastases.  If this is a non-small cell lung cancer then this would be considered at least a T2bN2M1a. 2.  Indeterminant lesions within the liver.  Consider further evaluation with PET CT.   Original Report Authenticated By: Signa Kell, M.D.    EKG: Independently reviewed Sinus tachycardia with no ST elevations  or depressions  Assessment/Plan Active Problems:   1. Symptomatic anemia - Pt has been typed and crossed, will place order for 3 units of PRBC to be transfused.  - Nursing to place post transfusion cbc - Most likely related to suspected neoplastic process - anemia panel - Was fecal occult blood negative - place in telemetry  2. HTN - will hold BP medications given # 1 - continue to monitor.  3. COPD - compensated currently.  Will therefore continue home albuterol as needed  4. Tobacco abuse - will plan on discussing cessation.  5. Leukocytosis -  Most likely due to neoplastic process - Meets sirs criteria and therefor will pan culture and start broad spectrum antibiotics. - Of note patient degree of anemia may contribute to tachycardia and his tachypnea  Code Status: full code Family Communication:  Discussed with patient and daughter at bedside. Disposition Plan:  Pending improvement in  current condition  Time spent: > 60 minutes.  Penny Pia [facility] Pager [phone]  If 7PM-7AM, please contact night-coverage [email or web address] Password [facility] 04/05/2013, 5:14 PM

## 2013-04-05 NOTE — ED Notes (Signed)
4w called for report, rn unavailable at this time

## 2013-04-05 NOTE — Addendum Note (Signed)
Addended by: Eulah Pont on: 04/05/2013 01:03 PM   Modules accepted: Orders

## 2013-04-05 NOTE — ED Provider Notes (Signed)
History     CSN: [id number]  Arrival date & time 04/05/13  1419   First MD Initiated Contact with Patient 04/05/13 1502      Chief Complaint  Patient presents with  . Anemia    (Consider location/radiation/quality/duration/timing/severity/associated sxs/prior treatment) The history is provided by the patient.  Cameron Mayhew Lorren Splawn. is a 79 y.o. male hx of HTN, smoker here with SOB and weakness. Shortness of breath and weak slowly progressive over the last month. He saw PMD several days ago and was found to be anemic and has R lobe mass. Never diagnosed with lung cancer but still smoking. He has worsening SOB with exertion now. Sent in by PMD for transfusion. Denies GI bleed or melena. Has external hemorrhoids.     Past Medical History  Diagnosis Date  . Shingles outbreak 06/16/10    Lt anterior and postior thigh/buttocks  . Hypertension   . Tobacco abuse     No past surgical history on file.  No family history on file.  History  Substance Use Topics  . Smoking status: Current Every Day Smoker -- 1.50 packs/day for 60 years    Types: Cigarettes  . Smokeless tobacco: Not on file  . Alcohol Use: No      Review of Systems  Respiratory: Positive for shortness of breath.   Neurological: Positive for weakness.  All other systems reviewed and are negative.    Allergies  Review of patient's allergies indicates no known allergies.  Home Medications   Current Outpatient Rx  Name  Route  Sig  Dispense  Refill  . albuterol (PROVENTIL HFA;VENTOLIN HFA) 108 (90 BASE) MCG/ACT inhaler   Inhalation   Inhale 2 puffs into the lungs every 6 (six) hours as needed for wheezing.   1 Inhaler   0   . amLODipine (NORVASC) 5 MG tablet   Oral   Take 1 tablet (5 mg total) by mouth daily.   90 tablet   3   . aspirin 81 MG tablet   Oral   Take 1 tablet (81 mg total) by mouth daily.   30 tablet   11   . hydrochlorothiazide (MICROZIDE) 12.5 MG capsule   Oral   Take 1 capsule  (12.5 mg total) by mouth daily.   90 capsule   3     BP 160/77  Pulse 103  Temp(Src) 99.6 F (37.6 C) (Oral)  Resp 26  SpO2 92%  Physical Exam  Nursing note and vitals reviewed. Constitutional: He is oriented to person, place, and time. He appears well-nourished.  Tired, slightly pale   HENT:  Head: Normocephalic.  MM moist,   Eyes: Pupils are equal, round, and reactive to light.  conjuntiva slightly pale   Neck: Normal range of motion. Neck supple.  Cardiovascular: Normal rate, regular rhythm and normal heart sounds.   Pulmonary/Chest: Effort normal and breath sounds normal. No respiratory distress. He has no wheezes. He has no rales.  Abdominal: Soft. Bowel sounds are normal. He exhibits no distension. There is no tenderness. There is no rebound and no guarding.  Rectal- large external hemorrhoids, no active bleeding, not thrombosed   Musculoskeletal: Normal range of motion.  Neurological: He is alert and oriented to person, place, and time.  Skin: Skin is warm and dry.  Psychiatric: He has a normal mood and affect. His behavior is normal. Judgment and thought content normal.    ED Course  Procedures (including critical care time)  CRITICAL CARE Performed by: Silverio Lay,  DAVID   Total critical care time: 30 min   Critical care time was exclusive of separately billable procedures and treating other patients.  Critical care was necessary to treat or prevent imminent or life-threatening deterioration.  Critical care was time spent personally by me on the following activities: development of treatment plan with patient and/or surrogate as well as nursing, discussions with consultants, evaluation of patient's response to treatment, examination of patient, obtaining history from patient or surrogate, ordering and performing treatments and interventions, ordering and review of laboratory studies, ordering and review of radiographic studies, pulse oximetry and re-evaluation of  patient's condition.   Labs Reviewed  CBC - Abnormal; Notable for the following:    WBC 23.8 (*)    RBC 3.59 (*)    Hemoglobin 5.6 (*)    HCT 21.1 (*)    MCV 58.8 (*)    MCH 15.6 (*)    MCHC 26.5 (*)    RDW 19.5 (*)    All other components within normal limits  BASIC METABOLIC PANEL - Abnormal; Notable for the following:    Sodium 133 (*)    GFR calc non Af Amer 85 (*)    All other components within normal limits  PROTIME-INR  TROPONIN I  OCCULT BLOOD X 1 CARD TO LAB, STOOL  OCCULT BLOOD, POC DEVICE  TYPE AND SCREEN  PREPARE RBC (CROSSMATCH)  ABO/RH   Ct Chest W Contrast  04/04/2013   *RADIOLOGY REPORT*  Clinical Data: Palpitations and dyspnea  CT CHEST WITH CONTRAST  Technique:  Multidetector CT imaging of the chest was performed following the standard protocol during bolus administration of intravenous contrast.  Contrast: 80mL OMNIPAQUE IOHEXOL 300 MG/ML  SOLN  Comparison: None.  Findings: There is no pleural effusion identified.  Severe changes of  COPD/emphysema identified.  There is a large central obstructing mass occluding the right lower lobe airway.  This measures approximately 5.6 x 6.7 x 6.0 cm.  Central areas of low attenuation are noted suggestive of necrosis.   Endobronchial extension and occlusion of this tumor is noted, image 34/series 3. There is postobstructive consolidation of the much of the left lower lobe.  Spiculated right upper lobe perihilar lesion measures 1.9 cm. There is a right upper lobe spiculated lesion measuring 1.1 cm, image 31/series 3.  Normal heart size.  No pericardial effusion.  The subcarinal lymph node is enlarged measuring 2.5 cm, image 33/series 2.  Limited imaging through the upper abdomen shows an indeterminant low attenuation lesion in the left hepatic lobe measuring 1.9 cm, image 60/series 2.  Within the right hepatic lobe there is an indeterminate lesion measuring 2.3 cm, image 56/series 2.  Review of the visualized bony structures shows no  aggressive lytic or sclerotic bone lesions.  IMPRESSION:  1.  Central obstructing mass within the left lower lobe is present. There are several, suspicious spiculated lesions within the right lung which are concerning for metastases.  If this is a non-small cell lung cancer then this would be considered at least a T2bN2M1a. 2.  Indeterminant lesions within the liver.  Consider further evaluation with PET CT.   Original Report Authenticated By: Signa Kell, M.D.     No diagnosis found.   Date: 04/05/2013  Rate: 94  Rhythm: normal sinus rhythm  QRS Axis: normal  Intervals: normal  ST/T Wave abnormalities: normal  Conduction Disutrbances:none  Narrative Interpretation: some sinus arrhythmias  Old EKG Reviewed: none available    MDM  Cameron Mortimer. is  a 79 y.o. male here with SOB, weakness. Likely symptomatic anemia. Will repeat CBC and labs. Will likely need transfusion.   5:05 PM Hg 5.6, dec from prior. WBC more elevated but he doesn't appear septic. Abx held. I ordered transfusion. He also has elevated trop, likely demand ischemia. I held off of asa and heparin due to anemia. Will admit for transfusion and monitoring. Dr. Cena Benton accepted the patient and will do orders.         Cameron Canal, MD 04/05/13 [phone]

## 2013-04-05 NOTE — Addendum Note (Signed)
Addended by: Eulah Pont on: 04/05/2013 01:02 PM   Modules accepted: Orders

## 2013-04-06 ENCOUNTER — Observation Stay (HOSPITAL_COMMUNITY): Payer: Medicare Other

## 2013-04-06 LAB — URINALYSIS, ROUTINE W REFLEX MICROSCOPIC
Hgb urine dipstick: NEGATIVE
Leukocytes, UA: NEGATIVE
Nitrite: NEGATIVE
Protein, ur: NEGATIVE mg/dL
Specific Gravity, Urine: 1.013 (ref 1.005–1.030)
Urobilinogen, UA: 1 mg/dL (ref 0.0–1.0)

## 2013-04-06 LAB — CBC WITH DIFFERENTIAL/PLATELET
Basophils Absolute: 0 K/uL (ref 0.0–0.1)
Basophils Relative: 0 % (ref 0–1)
Eosinophils Absolute: 0 K/uL (ref 0.0–0.7)
Eosinophils Relative: 0 % (ref 0–5)
HCT: 29 % — ABNORMAL LOW (ref 39.0–52.0)
Hemoglobin: 8.5 g/dL — ABNORMAL LOW (ref 13.0–17.0)
Lymphocytes Relative: 44 % (ref 12–46)
Lymphs Abs: 11.2 K/uL — ABNORMAL HIGH (ref 0.7–4.0)
MCH: 18.6 pg — ABNORMAL LOW (ref 26.0–34.0)
MCHC: 29.3 g/dL — ABNORMAL LOW (ref 30.0–36.0)
MCV: 63.3 fL — ABNORMAL LOW (ref 78.0–100.0)
Monocytes Absolute: 1.8 K/uL — ABNORMAL HIGH (ref 0.1–1.0)
Monocytes Relative: 7 % (ref 3–12)
Neutro Abs: 12.5 K/uL — ABNORMAL HIGH (ref 1.7–7.7)
Neutrophils Relative %: 49 % (ref 43–77)
Platelets: 385 K/uL (ref 150–400)
RBC: 4.58 MIL/uL (ref 4.22–5.81)
RDW: 24.7 % — ABNORMAL HIGH (ref 11.5–15.5)
WBC: 25.5 K/uL — ABNORMAL HIGH (ref 4.0–10.5)

## 2013-04-06 LAB — CBC
HCT: 29 % — ABNORMAL LOW (ref 39.0–52.0)
Hemoglobin: 8.5 g/dL — ABNORMAL LOW (ref 13.0–17.0)
MCH: 18.7 pg — ABNORMAL LOW (ref 26.0–34.0)
MCHC: 30.3 g/dL (ref 30.0–36.0)
MCHC: 29.3 g/dL — ABNORMAL LOW (ref 30.0–36.0)
MCV: 63.9 fL — ABNORMAL LOW (ref 78.0–100.0)
Platelets: 388 K/uL (ref 150–400)
RBC: 4.54 MIL/uL (ref 4.22–5.81)
RDW: 25.4 % — ABNORMAL HIGH (ref 11.5–15.5)
RDW: 25.2 % — ABNORMAL HIGH (ref 11.5–15.5)
WBC: 24.9 10*3/uL — ABNORMAL HIGH (ref 4.0–10.5)
WBC: 25.2 K/uL — ABNORMAL HIGH (ref 4.0–10.5)

## 2013-04-06 LAB — BASIC METABOLIC PANEL WITH GFR
BUN: 13 mg/dL (ref 6–23)
CO2: 22 meq/L (ref 19–32)
Calcium: 8.3 mg/dL — ABNORMAL LOW (ref 8.4–10.5)
Chloride: 99 meq/L (ref 96–112)
Creatinine, Ser: 0.83 mg/dL (ref 0.50–1.35)
GFR calc Af Amer: >90 mL/min
GFR calc non Af Amer: 83 mL/min — ABNORMAL LOW
Glucose, Bld: 112 mg/dL — ABNORMAL HIGH (ref 70–99)
Potassium: 3.6 meq/L (ref 3.5–5.1)
Sodium: 131 meq/L — ABNORMAL LOW (ref 135–145)

## 2013-04-06 LAB — TSH: TSH: 1.549 u[IU]/mL (ref 0.350–4.500)

## 2013-04-06 LAB — FERRITIN: Ferritin: 10 ng/mL — ABNORMAL LOW (ref 22–322)

## 2013-04-06 LAB — SEDIMENTATION RATE: Sed Rate: 20 mm/h — ABNORMAL HIGH (ref 0–16)

## 2013-04-06 LAB — C-REACTIVE PROTEIN: CRP: 10.7 mg/dL — ABNORMAL HIGH

## 2013-04-06 LAB — IRON AND TIBC
Iron: <10 ug/dL — ABNORMAL LOW (ref 42–135)
UIBC: 364 ug/dL (ref 125–400)

## 2013-04-06 MED ORDER — FUROSEMIDE 10 MG/ML IJ SOLN
INTRAMUSCULAR | Status: AC
  Filled 2013-04-06: qty 4

## 2013-04-06 MED ORDER — FERROUS SULFATE 325 (65 FE) MG PO TABS
325.0000 mg | ORAL_TABLET | Freq: Three times a day (TID) | ORAL | Status: DC
  Administered 2013-04-06 – 2013-04-14 (×24): 325 mg via ORAL
  Filled 2013-04-06 (×27): qty 1

## 2013-04-06 MED ORDER — FUROSEMIDE 10 MG/ML IJ SOLN
20.0000 mg | Freq: Once | INTRAMUSCULAR | Status: AC
  Administered 2013-04-06: 20 mg via INTRAVENOUS
  Filled 2013-04-06: qty 2

## 2013-04-06 NOTE — Telephone Encounter (Signed)
Pt checked into [facility] 04/05/13. He has had a transfusion.

## 2013-04-06 NOTE — Progress Notes (Signed)
[facility] PROGRESS NOTE  Cameron Lam. GEX:[id number] DOB: 19-Aug-1934 DOA: 04/05/2013 PCP: No PCP Per Patient  Assessment/Plan:  1. Symptomatic anemia/Iron deficiency anemia - Pt transfused 3 units of PRBC - Iron levels low on lab testing.  As such will add ferrous sulfate 325 mg po tid - anemia panel reviewed - Was fecal occult blood negative  - continue in telemetry  - Patient developed SOB during 3rd infusion of PRBC. Was given lasix.  Will order chest x ray to help me characterize reason for SOB  2. HTN  - will hold BP medications given # 1  - continue to monitor.   3. COPD  - compensated currently. Will therefore continue home albuterol as needed   4. Tobacco abuse  - will plan on discussing cessation.   5. Leukocytosis  - Most likely due to neoplastic process  - Meets sirs criteria so patient was pan cultured and broad spectrum antibiotics were started.  - Of note patient degree of anemia may contribute to tachycardia and his tachypnea - With obstructing lung mass this raises the suspicion for post obstructive pneumonia.    6. Suspected malnutrition - consult dietitian for evaluation.  Code Status: full Family Communication: discussed with patient and family  Disposition Plan: Pending improvement in clinical condition.   Consultants:  Consult dietitian Pending  Procedures:  none  Antibiotics:  Vancomycin and Zosyn  HPI/Subjective: Pt states that he feels better. He was able to get up and move around with no problems  Objective: Filed Vitals:   04/06/13 0340 04/06/13 0345 04/06/13 0450 04/06/13 1333  BP: 178/84 176/84 156/87 133/88  Pulse: 100 103 105 91  Temp: 99 F (37.2 C) 99 F (37.2 C) 99 F (37.2 C) 98.4 F (36.9 C)  TempSrc: Oral Oral Oral Oral  Resp: 24 24 26 23   Height:      Weight:      SpO2: 84% 90% 89% 97%    Intake/Output Summary (Last 24 hours) at 04/06/13 1356 Last data filed at 04/06/13 1300  Gross per 24 hour   Intake   1860 ml  Output   1500 ml  Net    360 ml   Filed Weights   04/05/13 1824  Weight: 66.225 kg (146 lb)    Exam:   General:  Pt in NAD, Alert and Awake  Cardiovascular: RRR, no MRG  Respiratory: CTA BL, no wheezes  Abdomen: soft, NT, ND  Musculoskeletal: no cyanosis or clubbing.   Data Reviewed: Basic Metabolic Panel:  Recent Labs Lab 04/04/13 1303 04/05/13 1550 04/06/13 0519  NA 133* 133* 131*  K 3.3* 4.7 3.6  CL 97 99 99  CO2 25 25 22   GLUCOSE 81 97 112*  BUN 16 16 13   CREATININE 0.81 0.79 0.83  CALCIUM 9.0 8.7 8.3*   Liver Function Tests:  Recent Labs Lab 04/04/13 1303  AST 13  ALT 9  ALKPHOS 78  BILITOT 0.3  PROT 6.7  ALBUMIN 3.3*   No results found for this basename: LIPASE, AMYLASE,  in the last 168 hours No results found for this basename: AMMONIA,  in the last 168 hours CBC:  Recent Labs Lab 04/04/13 1303 04/05/13 1550 04/06/13 0519 04/06/13 0606 04/06/13 1154  WBC 20.4* 23.8* 24.9* 25.2* 25.5*  NEUTROABS 8.6*  --   --   --  12.5*  HGB 6.2* 5.6* 8.2* 8.5* 8.5*  HCT 22.5* 21.1* 27.1* 29.0* 29.0*  MCV 58.4* 58.8* 63.5* 63.9* 63.3*  PLT 390  389 392 388 385   Cardiac Enzymes:  Recent Labs Lab 04/05/13 1550  TROPONINI <0.30   BNP (last 3 results) No results found for this basename: PROBNP,  in the last 8760 hours CBG: No results found for this basename: GLUCAP,  in the last 168 hours  No results found for this or any previous visit (from the past 240 hour(s)).   Studies: Ct Chest W Contrast  04/04/2013   *RADIOLOGY REPORT*  Clinical Data: Palpitations and dyspnea  CT CHEST WITH CONTRAST  Technique:  Multidetector CT imaging of the chest was performed following the standard protocol during bolus administration of intravenous contrast.  Contrast: 80mL OMNIPAQUE IOHEXOL 300 MG/ML  SOLN  Comparison: None.  Findings: There is no pleural effusion identified.  Severe changes of  COPD/emphysema identified.  There is a large  central obstructing mass occluding the right lower lobe airway.  This measures approximately 5.6 x 6.7 x 6.0 cm.  Central areas of low attenuation are noted suggestive of necrosis.   Endobronchial extension and occlusion of this tumor is noted, image 34/series 3. There is postobstructive consolidation of the much of the left lower lobe.  Spiculated right upper lobe perihilar lesion measures 1.9 cm. There is a right upper lobe spiculated lesion measuring 1.1 cm, image 31/series 3.  Normal heart size.  No pericardial effusion.  The subcarinal lymph node is enlarged measuring 2.5 cm, image 33/series 2.  Limited imaging through the upper abdomen shows an indeterminant low attenuation lesion in the left hepatic lobe measuring 1.9 cm, image 60/series 2.  Within the right hepatic lobe there is an indeterminate lesion measuring 2.3 cm, image 56/series 2.  Review of the visualized bony structures shows no aggressive lytic or sclerotic bone lesions.  IMPRESSION:  1.  Central obstructing mass within the left lower lobe is present. There are several, suspicious spiculated lesions within the right lung which are concerning for metastases.  If this is a non-small cell lung cancer then this would be considered at least a [id number]. 2.  Indeterminant lesions within the liver.  Consider further evaluation with PET CT.   Original Report Authenticated By: Signa Kell, M.D.    Scheduled Meds: . ferrous sulfate  325 mg Oral TID WC  . piperacillin-tazobactam (ZOSYN)  IV  3.375 g Intravenous Q8H  . sodium chloride  3 mL Intravenous Q12H  . sodium chloride  3 mL Intravenous Q12H  . vancomycin  750 mg Intravenous Q12H   Continuous Infusions:   Principal Problem:   Anemia, iron deficiency Active Problems:   Hypertension   Tobacco abuse   COPD (chronic obstructive pulmonary disease)   Leukocytosis    Time spent: > 35 minutes    Penny Pia  [facility] Pager [phone] If 7PM-7AM, please contact  night-coverage at [email or web address], password [facility] 04/06/2013, 1:56 PM  LOS: 1 day

## 2013-04-06 NOTE — Progress Notes (Signed)
Patient woke up from sleep with shortness of breath and O2 sats of 84% on RA.  Placed patient on 2L of oxygen and O2 sats were sitting at 91%.  Patient was on his third unit of blood which had been running for over an hour before feeling short of breath.  Floor coverage was notified and placed order for IV lasix and stopped the infusion.  Will continue to monitor.  Ernesta Amble, RN

## 2013-04-06 NOTE — Progress Notes (Signed)
The nurse tech was assisting patient to bath and she found a large piece of black duck tape covering his anus.  She called me in to assess and when I asked the patient if he put it there, he said "I put it there to hold my Hemorids in and I have done this for over 30 years."  There was also stool under tape, patient was cleaned and no skin breakdown present.  I passed this information along to day shift nurse, she will continue to monitor.  Ernesta Amble, RN

## 2013-04-07 LAB — TYPE AND SCREEN
ABO/RH(D): O POS
Unit division: 0

## 2013-04-07 LAB — TRANSFUSION REACTION: DAT C3: NEGATIVE

## 2013-04-07 LAB — CBC
HCT: 29.2 % — ABNORMAL LOW (ref 39.0–52.0)
MCH: 18.9 pg — ABNORMAL LOW (ref 26.0–34.0)
MCV: 64 fL — ABNORMAL LOW (ref 78.0–100.0)
Platelets: ADEQUATE 10*3/uL (ref 150–400)
RDW: 25.3 % — ABNORMAL HIGH (ref 11.5–15.5)
WBC: 27.7 10*3/uL — ABNORMAL HIGH (ref 4.0–10.5)

## 2013-04-07 LAB — URINE CULTURE

## 2013-04-07 MED ORDER — ADULT MULTIVITAMIN W/MINERALS CH
1.0000 | ORAL_TABLET | Freq: Every day | ORAL | Status: DC
  Administered 2013-04-07 – 2013-04-14 (×8): 1 via ORAL
  Filled 2013-04-07 (×8): qty 1

## 2013-04-07 MED ORDER — LEVOFLOXACIN 750 MG PO TABS
750.0000 mg | ORAL_TABLET | ORAL | Status: DC
  Administered 2013-04-07 – 2013-04-14 (×8): 750 mg via ORAL
  Filled 2013-04-07 (×8): qty 1

## 2013-04-07 MED ORDER — VANCOMYCIN HCL 10 G IV SOLR
1250.0000 mg | Freq: Two times a day (BID) | INTRAVENOUS | Status: DC
  Administered 2013-04-07 – 2013-04-14 (×14): 1250 mg via INTRAVENOUS
  Filled 2013-04-07 (×16): qty 1250

## 2013-04-07 MED ORDER — ENSURE COMPLETE PO LIQD
237.0000 mL | Freq: Two times a day (BID) | ORAL | Status: DC
  Administered 2013-04-07 – 2013-04-14 (×16): 237 mL via ORAL

## 2013-04-07 NOTE — Progress Notes (Signed)
INITIAL NUTRITION ASSESSMENT  DOCUMENTATION CODES Per approved criteria  -Not Applicable   INTERVENTION: Provide Ensure Complete BID Provide Multivitamin with minerals daily  Recommend continuation of Ensure Complete 1-2 times daily after discharge  NUTRITION DIAGNOSIS: Inadequate oral intake related to hospitalization as evidenced by pt's report of eating < 50% of meals.   Goal: Pt to meet >/= 90% of their estimated nutrition needs  Monitor:  PO intake Weight Labs   Reason for Assessment: Consult  79 y.o. male  Admitting Dx: Anemia, iron deficiency  ASSESSMENT: 79 y.o. male with history of tobacco use since he was 79 years old, HTN, and recent lung mass (currently awaiting further work up with oncologist). Presented to the ED after radiologist recommended patient come to the ED due to lab work showing a low hemoglobin.   Pt reports that he has a good appetite and was eating 3 meals daily plus snacks PTA. Pt reports that he doesn't like the food here and he only ate 30% of his breakfast. Pt denies wt loss stating her usually weighs around 146 lbs. Encouraged protein-rich foods at each meal daily; pt reports that he eats a lot of fish, chicken, and beans. Physical exam shows signs of moderate muscle wasting in temples, clavicles, and legs. Pt likes Ensure; RD brought pt one Ensure Complete at time of visit. Encouraged pt to continue Ensure use after discharge.  Height: Ht Readings from Last 1 Encounters:  04/05/13 5\' 7"  (1.702 m)    Weight: Wt Readings from Last 1 Encounters:  04/05/13 146 lb (66.225 kg)    Ideal Body Weight: 148 lbs  % Ideal Body Weight: 98%  Wt Readings from Last 10 Encounters:  04/05/13 146 lb (66.225 kg)  04/03/13 138 lb (62.596 kg)  03/02/13 140 lb 3.2 oz (63.594 kg)  12/11/11 142 lb 6.4 oz (64.592 kg)    Usual Body Weight: 146 lbs  % Usual Body Weight: 100%  BMI:  Body mass index is 22.86 kg/(m^2).  Estimated Nutritional  Needs: Kcal: 1570-1830 Protein: 99-106 grams Fluid: 2 L  Skin: WDL  Nutrition Focused Physical Exam:  Subcutaneous Fat:  Orbital Region: wnl Upper Arm Region: wnl Thoracic and Lumbar Region: NA  Muscle:  Temple Region: moderate wasting Clavicle Bone Region: moderate wasting Clavicle and Acromion Bone Region: moderate wasting Scapular Bone Region: NA Dorsal Hand: wnl Patellar Region: moderate wasting Anterior Thigh Region: moderate wasting Posterior Calf Region: mild wasting  Edema: none  Diet Order: Sodium Restricted  EDUCATION NEEDS: -No education needs identified at this time   Intake/Output Summary (Last 24 hours) at 04/07/13 1307 Last data filed at 04/07/13 0600  Gross per 24 hour  Intake    820 ml  Output    600 ml  Net    220 ml    Last BM: 6/15   Labs:   Recent Labs Lab 04/04/13 1303 04/05/13 1550 04/06/13 0519  NA 133* 133* 131*  K 3.3* 4.7 3.6  CL 97 99 99  CO2 25 25 22   BUN 16 16 13   CREATININE 0.81 0.79 0.83  CALCIUM 9.0 8.7 8.3*  GLUCOSE 81 97 112*    CBG (last 3)  No results found for this basename: GLUCAP,  in the last 72 hours  Scheduled Meds: . ferrous sulfate  325 mg Oral TID WC  . levofloxacin  750 mg Oral Q24H  . sodium chloride  3 mL Intravenous Q12H  . sodium chloride  3 mL Intravenous Q12H  . vancomycin  750 mg Intravenous Q12H    Continuous Infusions:   Past Medical History  Diagnosis Date  . Shingles outbreak 06/16/10    Lt anterior and postior thigh/buttocks  . Hypertension   . Tobacco abuse     History reviewed. No pertinent past surgical history.  Ian Malkin RD, LDN Inpatient Clinical Dietitian Pager: [phone] After Hours Pager: [phone]

## 2013-04-07 NOTE — Progress Notes (Signed)
ANTIBIOTIC CONSULT NOTE - FOLLOW UP  Pharmacy Consult for Vancomycin Indication: pneumonia  No Known Allergies  Patient Measurements: Height: 5\' 7"  (170.2 cm) Weight: 146 lb (66.225 kg) IBW/kg (Calculated) : 66.1 Adjusted Body Weight:   Vital Signs: Temp: 98.1 F (36.7 C) (06/16 1423) Temp src: Oral (06/16 1423) BP: 117/64 mmHg (06/16 1423) Pulse Rate: 76 (06/16 1423) Intake/Output from previous day: 06/15 0701 - 06/16 0700 In: 1380 [P.O.:840; I.V.:90; IV Piggyback:450] Out: 1100 [Urine:1100] Intake/Output from this shift:    Labs:  Recent Labs  04/05/13 1550 04/06/13 0519 04/06/13 0606 04/06/13 1154 04/07/13 0448  WBC 23.8* 24.9* 25.2* 25.5* 27.7*  HGB 5.6* 8.2* 8.5* 8.5* 8.6*  PLT 389 392 388 385 PLATELET CLUMPS NOTED ON SMEAR, COUNT APPEARS ADEQUATE  CREATININE 0.79 0.83  --   --   --    Estimated Creatinine Clearance: 70.8 ml/min (by C-G formula based on Cr of 0.83).  Recent Labs  04/07/13 1900  VANCOTROUGH 9.1*     Microbiology: Recent Results (from the past 720 hour(s))  URINE CULTURE     Status: None   Collection Time    04/05/13  6:55 PM      Result Value Range Status   Specimen Description URINE, CLEAN CATCH   Final   Special Requests NONE   Final   Culture  Setup Time 04/06/2013 03:35   Final   Colony Count 55,000 COLONIES/ML   Final   Culture     Final   Value: Multiple bacterial morphotypes present, none predominant. Suggest appropriate recollection if clinically indicated.   Report Status 04/07/2013 FINAL   Final  CULTURE, BLOOD (ROUTINE X 2)     Status: None   Collection Time    04/05/13  6:58 PM      Result Value Range Status   Specimen Description BLOOD LEFT HAND  7 ML IN AEROBIC ONLY   Final   Special Requests NONE   Final   Culture  Setup Time 04/06/2013 02:21   Final   Culture     Final   Value:        BLOOD CULTURE RECEIVED NO GROWTH TO DATE CULTURE WILL BE HELD FOR 5 DAYS BEFORE ISSUING A FINAL NEGATIVE REPORT   Report Status  PENDING   Incomplete  CULTURE, BLOOD (ROUTINE X 2)     Status: None   Collection Time    04/05/13  7:02 PM      Result Value Range Status   Specimen Description BLOOD RIGHT ARM  2 ML IN AEROBIC ONLY   Final   Special Requests NONE   Final   Culture  Setup Time 04/06/2013 02:21   Final   Culture     Final   Value:        BLOOD CULTURE RECEIVED NO GROWTH TO DATE CULTURE WILL BE HELD FOR 5 DAYS BEFORE ISSUING A FINAL NEGATIVE REPORT   Report Status PENDING   Incomplete    Anti-infectives   Start     Dose/Rate Route Frequency Ordered Stop   04/07/13 2100  vancomycin (VANCOCIN) 1,250 mg in sodium chloride 0.9 % 250 mL IVPB     1,250 mg 166.7 mL/hr over 90 Minutes Intravenous Every 12 hours 04/07/13 2027     04/07/13 1600  levofloxacin (LEVAQUIN) tablet 750 mg     750 mg Oral Every 24 hours 04/07/13 1104     04/06/13 0200  piperacillin-tazobactam (ZOSYN) IVPB 3.375 g  Status:  Discontinued  3.375 g 12.5 mL/hr over 240 Minutes Intravenous Every 8 hours 04/05/13 1846 04/07/13 1027   04/05/13 2000  vancomycin (VANCOCIN) IVPB 750 mg/150 ml premix  Status:  Discontinued     750 mg 150 mL/hr over 60 Minutes Intravenous Every 12 hours 04/05/13 1854 04/07/13 2027   04/05/13 1900  piperacillin-tazobactam (ZOSYN) IVPB 3.375 g     3.375 g 100 mL/hr over 30 Minutes Intravenous  Once 04/05/13 1846 04/05/13 2211      Assessment: 78 yom presented to Urgent Care 6/12 with SOB, hypertension. Lab work done, revealed Hgb 6.2 and chest CT with lung mass concerning for metastases - MD recommended ED visit. Pt presented 6/14 to ED for transfusion, found Hgb 5.6, leukocytosis, tachycardic, tachypnic, elevated troponin - met SIRS criteria per MD so Vanc and Zosyn started empirically for r/o sepsis. Today, MD stopped Zosyn and ordered Levaquin per Rx dosing for lungs coverage.  Day#3 Vanc, narrowing Zosyn to Levaquin today, ?post obstructive PNA given obstructing lung mass. CXT 6/15 with progression of lung  densities compared to 6/12.  Patient is afebrile, WBC rising to 27.7K, Scr 0.83 for CG CrCl 71 ml/min and normalized CrCl 77 ml/min. Blood cultures NGTD, urine cultures with 55K multiple organisms, nne predominant.  Vanc trough= 9.1    Goal of Therapy:  Vancomycin trough level 15-20 mcg/ml  Plan:  Based on trough level, will increase to Vancomycin 1250mg  IV q 12 hours. Follow up culture results  Loletta Specter 04/07/2013,8:28 PM

## 2013-04-07 NOTE — Progress Notes (Signed)
[facility] PROGRESS NOTE  Cameron Lam. WJX:[id number] DOB: May 22, 1934 DOA: 04/05/2013 PCP: No PCP Per Patient  Assessment/Plan:  1. Symptomatic anemia/Iron deficiency anemia - Pt transfused 3 units of PRBC.  Will recheck cbc next am. - Iron levels low on lab testing.  As such will add ferrous sulfate 325 mg po tid - anemia panel reviewed - Was fecal occult blood negative  - continue in telemetry  - Patient developed SOB during 3rd infusion of PRBC. Was given lasix.    2. HTN  - will hold BP medications given # 1  - continue to monitor.   3. COPD  - compensated currently. Will therefore continue home albuterol as needed   4. Tobacco abuse  - will plan on discussing cessation.   5. Leukocytosis  - Most likely due to neoplastic process although infectious etiology possible - Last chest xray reported possible post obstructive pneumonia. - Will transition patient from zosyn to levaquin.  If patient improves on levaquin may consider transitioning to oral form once ready for discharge.  6. Suspected malnutrition - consult dietitian for evaluation. - Currently recommending ensure BID.  Code Status: full Family Communication: discussed with patient and family  Disposition Plan: Pending improvement in clinical condition.   Consultants:  Consult dietitian Pending  Procedures:  none  Antibiotics:  Vancomycin and Zosyn  HPI/Subjective: Pt has no new complaints. No acute issues overnight.  States that he feels better currently.  Objective: Filed Vitals:   04/06/13 1333 04/06/13 2113 04/07/13 0502 04/07/13 1423  BP: 133/88 129/68 136/67 117/64  Pulse: 91 88 85 76  Temp: 98.4 F (36.9 C) 98.8 F (37.1 C) 98.1 F (36.7 C) 98.1 F (36.7 C)  TempSrc: Oral Oral Oral Oral  Resp: 23 20 19 18   Height:      Weight:      SpO2: 97% 95% 95% 96%    Intake/Output Summary (Last 24 hours) at 04/07/13 1456 Last data filed at 04/07/13 1424  Gross per 24 hour   Intake   1060 ml  Output    600 ml  Net    460 ml   Filed Weights   04/05/13 1824  Weight: 66.225 kg (146 lb)    Exam:   General:  Pt in NAD, Alert and Awake  Cardiovascular: RRR, no MRG  Respiratory: CTA BL, no wheezes  Abdomen: soft, NT, ND  Musculoskeletal: no cyanosis or clubbing.   Data Reviewed: Basic Metabolic Panel:  Recent Labs Lab 04/04/13 1303 04/05/13 1550 04/06/13 0519  NA 133* 133* 131*  K 3.3* 4.7 3.6  CL 97 99 99  CO2 25 25 22   GLUCOSE 81 97 112*  BUN 16 16 13   CREATININE 0.81 0.79 0.83  CALCIUM 9.0 8.7 8.3*   Liver Function Tests:  Recent Labs Lab 04/04/13 1303  AST 13  ALT 9  ALKPHOS 78  BILITOT 0.3  PROT 6.7  ALBUMIN 3.3*   No results found for this basename: LIPASE, AMYLASE,  in the last 168 hours No results found for this basename: AMMONIA,  in the last 168 hours CBC:  Recent Labs Lab 04/04/13 1303 04/05/13 1550 04/06/13 0519 04/06/13 0606 04/06/13 1154 04/07/13 0448  WBC 20.4* 23.8* 24.9* 25.2* 25.5* 27.7*  NEUTROABS 8.6*  --   --   --  12.5*  --   HGB 6.2* 5.6* 8.2* 8.5* 8.5* 8.6*  HCT 22.5* 21.1* 27.1* 29.0* 29.0* 29.2*  MCV 58.4* 58.8* 63.5* 63.9* 63.3* 64.0*  PLT 390 389  392 388 385 PLATELET CLUMPS NOTED ON SMEAR, COUNT APPEARS ADEQUATE   Cardiac Enzymes:  Recent Labs Lab 04/05/13 1550  TROPONINI <0.30   BNP (last 3 results) No results found for this basename: PROBNP,  in the last 8760 hours CBG: No results found for this basename: GLUCAP,  in the last 168 hours  Recent Results (from the past 240 hour(s))  URINE CULTURE     Status: None   Collection Time    04/05/13  6:55 PM      Result Value Range Status   Specimen Description URINE, CLEAN CATCH   Final   Special Requests NONE   Final   Culture  Setup Time 04/06/2013 03:35   Final   Colony Count 55,000 COLONIES/ML   Final   Culture     Final   Value: Multiple bacterial morphotypes present, none predominant. Suggest appropriate recollection if  clinically indicated.   Report Status 04/07/2013 FINAL   Final  CULTURE, BLOOD (ROUTINE X 2)     Status: None   Collection Time    04/05/13  6:58 PM      Result Value Range Status   Specimen Description BLOOD LEFT HAND  7 ML IN AEROBIC ONLY   Final   Special Requests NONE   Final   Culture  Setup Time 04/06/2013 02:21   Final   Culture     Final   Value:        BLOOD CULTURE RECEIVED NO GROWTH TO DATE CULTURE WILL BE HELD FOR 5 DAYS BEFORE ISSUING A FINAL NEGATIVE REPORT   Report Status PENDING   Incomplete  CULTURE, BLOOD (ROUTINE X 2)     Status: None   Collection Time    04/05/13  7:02 PM      Result Value Range Status   Specimen Description BLOOD RIGHT ARM  2 ML IN AEROBIC ONLY   Final   Special Requests NONE   Final   Culture  Setup Time 04/06/2013 02:21   Final   Culture     Final   Value:        BLOOD CULTURE RECEIVED NO GROWTH TO DATE CULTURE WILL BE HELD FOR 5 DAYS BEFORE ISSUING A FINAL NEGATIVE REPORT   Report Status PENDING   Incomplete     Studies: Dg Chest Port 1 View  04/06/2013   *RADIOLOGY REPORT*  Clinical Data: Shortness of breath after transfusion.  PORTABLE CHEST - 1 VIEW  Comparison: 04/03/2013 and CT from 04/04/2013  Findings: Portable upright view of the chest demonstrates densities in the left lower chest.  There may also be left pleural fluid. Upper lungs remain clear. Heart size is grossly stable.  Again note is fullness in the left hilar region related to the known opacity or lesion.  IMPRESSION: Densities in the left mid and lower chest.  Findings are similar to the recent CT but there has been progression since 04/03/2013. Findings could represent postobstructive pneumonitis and/or volume loss.  There may be pleural fluid.   Original Report Authenticated By: Richarda Overlie, M.D.    Scheduled Meds: . feeding supplement  237 mL Oral BID BM  . ferrous sulfate  325 mg Oral TID WC  . levofloxacin  750 mg Oral Q24H  . multivitamin with minerals  1 tablet Oral Daily   . sodium chloride  3 mL Intravenous Q12H  . sodium chloride  3 mL Intravenous Q12H  . vancomycin  750 mg Intravenous Q12H   Continuous Infusions:  Principal Problem:   Anemia, iron deficiency Active Problems:   Hypertension   Tobacco abuse   COPD (chronic obstructive pulmonary disease)   Leukocytosis    Time spent: > 35 minutes    Penny Pia  [facility] Pager [phone] If 7PM-7AM, please contact night-coverage at [email or web address], password [facility] 04/07/2013, 2:56 PM  LOS: 2 days

## 2013-04-07 NOTE — Progress Notes (Signed)
ANTIBIOTIC CONSULT NOTE - INITIAL  Pharmacy Consult for Vancomycin, Levaquin Indication: pneumonia  No Known Allergies  Patient Measurements: Height: 5\' 7"  (170.2 cm) Weight: 146 lb (66.225 kg) IBW/kg (Calculated) : 66.1  Vital Signs: Temp: 98.1 F (36.7 C) (06/16 0502) Temp src: Oral (06/16 0502) BP: 136/67 mmHg (06/16 0502) Pulse Rate: 85 (06/16 0502) Intake/Output from previous day: 06/15 0701 - 06/16 0700 In: 1380 [P.O.:840; I.V.:90; IV Piggyback:450] Out: 1100 [Urine:1100] Intake/Output from this shift:    Labs:  Recent Labs  04/04/13 1303 04/05/13 1550 04/06/13 0519 04/06/13 0606 04/06/13 1154 04/07/13 0448  WBC 20.4* 23.8* 24.9* 25.2* 25.5* 27.7*  HGB 6.2* 5.6* 8.2* 8.5* 8.5* 8.6*  PLT 390 389 392 388 385 PLATELET CLUMPS NOTED ON SMEAR, COUNT APPEARS ADEQUATE  CREATININE 0.81 0.79 0.83  --   --   --    Estimated Creatinine Clearance: 70.8 ml/min (by C-G formula based on Cr of 0.83). No results found for this basename: VANCOTROUGH, Leodis Binet, VANCORANDOM, GENTTROUGH, GENTPEAK, GENTRANDOM, TOBRATROUGH, TOBRAPEAK, TOBRARND, AMIKACINPEAK, AMIKACINTROU, AMIKACIN,  in the last 72 hours    Assessment: 78 yom presented to Urgent Care 6/12 with SOB, hypertension. Lab work done, revealed Hgb 6.2 and chest CT with lung mass concerning for metastases - MD recommended ED visit. Pt presented 6/14 to ED for transfusion, found Hgb 5.6, leukocytosis, tachycardic, tachypnic, elevated troponin - met SIRS criteria per MD so Vanc and Zosyn started empirically for r/o sepsis.  Today, MD stopped Zosyn and ordered Levaquin per Rx dosing for lungs coverage.   Day#3 Vanc, narrowing Zosyn to Levaquin today, ?post obstructive PNA given obstructing lung mass. CXT 6/15 with progression of lung densities compared to 6/12.  Patient is afebrile, WBC rising to 27.7K, Scr 0.83 for CG CrCl 71 ml/min and normalized CrCl 77 ml/min.  Blood cultures NGTD, urine cultures with 55K multiple organisms,  nne predominant.    Plan:   Levaquin 750 mg po q24h (PO route ok with Dr. Cena Benton)  Continue Vancomycin 750 mg IV q12h.  Pharmacy will check a Vancomycin trough tonight prior to 2000 dose  Geoffry Paradise, PharmD, BCPS Pager: [phone] 10:58 AM Pharmacy #: [id number]

## 2013-04-08 LAB — CBC
MCH: 18.8 pg — ABNORMAL LOW (ref 26.0–34.0)
MCHC: 29 g/dL — ABNORMAL LOW (ref 30.0–36.0)
MCV: 64.7 fL — ABNORMAL LOW (ref 78.0–100.0)
Platelets: 395 10*3/uL (ref 150–400)
RBC: 4.53 MIL/uL (ref 4.22–5.81)

## 2013-04-08 MED ORDER — HYDROCHLOROTHIAZIDE 12.5 MG PO CAPS
12.5000 mg | ORAL_CAPSULE | Freq: Every day | ORAL | Status: DC
  Administered 2013-04-08 – 2013-04-11 (×4): 12.5 mg via ORAL
  Filled 2013-04-08 (×5): qty 1

## 2013-04-08 MED ORDER — AMLODIPINE BESYLATE 5 MG PO TABS
5.0000 mg | ORAL_TABLET | Freq: Every day | ORAL | Status: DC
  Administered 2013-04-08 – 2013-04-11 (×4): 5 mg via ORAL
  Filled 2013-04-08 (×5): qty 1

## 2013-04-08 NOTE — Progress Notes (Signed)
[facility] PROGRESS NOTE  Cameron Lam. VWU:[id number] DOB: 1934/07/08 DOA: 04/05/2013 PCP: No PCP Per Patient  Brief Narrative: 79 y/o with lung mass with plans for follow up with oncologist as outpatient. Was found to have leukocytosis and anemia on admission. Found to have iron deficiency anemia and patient initially transfused 3 units of PRBC's.  Leukocytosis persistently elevated and chest xray would make mention of possible post obstructive pneumonia. Patient placed on IV antibiotics and currently improvement has been slow but clinically patient feels better.  Assessment/Plan:  1. Symptomatic anemia/Iron deficiency anemia - Pt transfused 3 units of PRBC.  Will recheck cbc next am. - Iron levels low on lab testing.  As such will add ferrous sulfate 325 mg po tid - anemia panel reviewed - Was fecal occult blood negative  - continue in telemetry  - Patient developed SOB during 3rd infusion of PRBC, but resolved after lasix.  2. HTN  - will continue home blood pressure medications given improvement in condition and elevated blood pressures off of the antihypertensive medications.  3. COPD  - compensated currently. Will therefore continue home albuterol as needed   4. Tobacco abuse  - Discussed cessation  5. Leukocytosis  - Most likely due to neoplastic process although infectious etiology possible - Last chest xray reported possible post obstructive pneumonia. - Improving on lovenox and vancomycin.  If patient improves on levaquin may consider transitioning to oral form once ready for discharge.  6. Suspected malnutrition - consult dietitian for evaluation. - Currently recommending ensure BID.  Code Status: full Family Communication: discussed with patient and family  Disposition Plan: Pending improvement in clinical condition.   Consultants:  Consult dietitian Pending  Procedures:  none  Antibiotics:  Vancomycin and Zosyn  HPI/Subjective: Pt has no  new complaints. No acute issues overnight.  States that he feels better currently.  Objective: Filed Vitals:   04/07/13 1423 04/07/13 2214 04/08/13 0711 04/08/13 1354  BP: 117/64 151/88 148/82 139/77  Pulse: 76 89 77 91  Temp: 98.1 F (36.7 C) 98.6 F (37 C) 98.4 F (36.9 C) 98.2 F (36.8 C)  TempSrc: Oral Oral Oral Oral  Resp: 18 20 20 18   Height:      Weight:      SpO2: 96% 95% 94% 94%    Intake/Output Summary (Last 24 hours) at 04/08/13 1802 Last data filed at 04/08/13 1355  Gross per 24 hour  Intake    850 ml  Output    850 ml  Net      0 ml   Filed Weights   04/05/13 1824  Weight: 66.225 kg (146 lb)    Exam:   General:  Pt in NAD, Alert and Awake  Cardiovascular: RRR, no MRG  Respiratory: CTA BL, no wheezes  Abdomen: soft, NT, ND  Musculoskeletal: no cyanosis or clubbing.   Data Reviewed: Basic Metabolic Panel:  Recent Labs Lab 04/04/13 1303 04/05/13 1550 04/06/13 0519  NA 133* 133* 131*  K 3.3* 4.7 3.6  CL 97 99 99  CO2 25 25 22   GLUCOSE 81 97 112*  BUN 16 16 13   CREATININE 0.81 0.79 0.83  CALCIUM 9.0 8.7 8.3*   Liver Function Tests:  Recent Labs Lab 04/04/13 1303  AST 13  ALT 9  ALKPHOS 78  BILITOT 0.3  PROT 6.7  ALBUMIN 3.3*   No results found for this basename: LIPASE, AMYLASE,  in the last 168 hours No results found for this basename: AMMONIA,  in  the last 168 hours CBC:  Recent Labs Lab 04/04/13 1303  04/06/13 0519 04/06/13 0606 04/06/13 1154 04/07/13 0448 04/08/13 0535  WBC 20.4*  < > 24.9* 25.2* 25.5* 27.7* 26.6*  NEUTROABS 8.6*  --   --   --  12.5*  --   --   HGB 6.2*  < > 8.2* 8.5* 8.5* 8.6* 8.5*  HCT 22.5*  < > 27.1* 29.0* 29.0* 29.2* 29.3*  MCV 58.4*  < > 63.5* 63.9* 63.3* 64.0* 64.7*  PLT 390  < > 392 388 385 PLATELET CLUMPS NOTED ON SMEAR, COUNT APPEARS ADEQUATE 395  < > = values in this interval not displayed. Cardiac Enzymes:  Recent Labs Lab 04/05/13 1550  TROPONINI <0.30   BNP (last 3  results) No results found for this basename: PROBNP,  in the last 8760 hours CBG: No results found for this basename: GLUCAP,  in the last 168 hours  Recent Results (from the past 240 hour(s))  URINE CULTURE     Status: None   Collection Time    04/05/13  6:55 PM      Result Value Range Status   Specimen Description URINE, CLEAN CATCH   Final   Special Requests NONE   Final   Culture  Setup Time 04/06/2013 03:35   Final   Colony Count 55,000 COLONIES/ML   Final   Culture     Final   Value: Multiple bacterial morphotypes present, none predominant. Suggest appropriate recollection if clinically indicated.   Report Status 04/07/2013 FINAL   Final  CULTURE, BLOOD (ROUTINE X 2)     Status: None   Collection Time    04/05/13  6:58 PM      Result Value Range Status   Specimen Description BLOOD LEFT HAND  7 ML IN AEROBIC ONLY   Final   Special Requests NONE   Final   Culture  Setup Time 04/06/2013 02:21   Final   Culture     Final   Value:        BLOOD CULTURE RECEIVED NO GROWTH TO DATE CULTURE WILL BE HELD FOR 5 DAYS BEFORE ISSUING A FINAL NEGATIVE REPORT   Report Status PENDING   Incomplete  CULTURE, BLOOD (ROUTINE X 2)     Status: None   Collection Time    04/05/13  7:02 PM      Result Value Range Status   Specimen Description BLOOD RIGHT ARM  2 ML IN AEROBIC ONLY   Final   Special Requests NONE   Final   Culture  Setup Time 04/06/2013 02:21   Final   Culture     Final   Value:        BLOOD CULTURE RECEIVED NO GROWTH TO DATE CULTURE WILL BE HELD FOR 5 DAYS BEFORE ISSUING A FINAL NEGATIVE REPORT   Report Status PENDING   Incomplete     Studies: No results found.  Scheduled Meds: . feeding supplement  237 mL Oral BID BM  . ferrous sulfate  325 mg Oral TID WC  . levofloxacin  750 mg Oral Q24H  . multivitamin with minerals  1 tablet Oral Daily  . sodium chloride  3 mL Intravenous Q12H  . sodium chloride  3 mL Intravenous Q12H  . vancomycin  1,250 mg Intravenous Q12H    Continuous Infusions:   Principal Problem:   Anemia, iron deficiency Active Problems:   Hypertension   Tobacco abuse   COPD (chronic obstructive pulmonary disease)   Leukocytosis  Time spent: > 35 minutes    Cameron Lam  [facility] Pager [phone] If 7PM-7AM, please contact night-coverage at [email or web address], password [facility] 04/08/2013, 6:02 PM  LOS: 3 days

## 2013-04-09 ENCOUNTER — Inpatient Hospital Stay (HOSPITAL_COMMUNITY): Payer: Medicare Other

## 2013-04-09 ENCOUNTER — Encounter (HOSPITAL_COMMUNITY): Payer: Self-pay | Admitting: Radiology

## 2013-04-09 ENCOUNTER — Telehealth: Payer: Self-pay | Admitting: Radiology

## 2013-04-09 DIAGNOSIS — D509 Iron deficiency anemia, unspecified: Secondary | ICD-10-CM

## 2013-04-09 DIAGNOSIS — J189 Pneumonia, unspecified organism: Secondary | ICD-10-CM | POA: Diagnosis present

## 2013-04-09 LAB — BASIC METABOLIC PANEL
CO2: 26 mEq/L (ref 19–32)
Calcium: 8.7 mg/dL (ref 8.4–10.5)
Creatinine, Ser: 0.85 mg/dL (ref 0.50–1.35)
GFR calc non Af Amer: 83 mL/min — ABNORMAL LOW (ref >90)
Glucose, Bld: 94 mg/dL (ref 70–99)

## 2013-04-09 LAB — CBC
MCH: 18.4 pg — ABNORMAL LOW (ref 26.0–34.0)
MCV: 65.2 fL — ABNORMAL LOW (ref 78.0–100.0)
Platelets: 410 10*3/uL — ABNORMAL HIGH (ref 150–400)
RDW: 27.1 % — ABNORMAL HIGH (ref 11.5–15.5)
WBC: 22.3 10*3/uL — ABNORMAL HIGH (ref 4.0–10.5)

## 2013-04-09 MED ORDER — SODIUM CHLORIDE 0.9 % IV SOLN
1020.0000 mg | Freq: Once | INTRAVENOUS | Status: AC
  Administered 2013-04-09: 1020 mg via INTRAVENOUS
  Filled 2013-04-09: qty 34

## 2013-04-09 MED ORDER — NICOTINE 14 MG/24HR TD PT24
14.0000 mg | MEDICATED_PATCH | Freq: Every day | TRANSDERMAL | Status: DC
  Administered 2013-04-09 – 2013-04-14 (×6): 14 mg via TRANSDERMAL
  Filled 2013-04-09 (×6): qty 1

## 2013-04-09 MED ORDER — IOHEXOL 300 MG/ML  SOLN: 25.0000 mL | INTRAMUSCULAR | Status: AC

## 2013-04-09 MED ORDER — IOHEXOL 300 MG/ML  SOLN
100.0000 mL | Freq: Once | INTRAMUSCULAR | Status: AC | PRN
  Administered 2013-04-09: 100 mL via INTRAVENOUS

## 2013-04-09 MED ORDER — IOHEXOL 300 MG/ML  SOLN
50.0000 mL | Freq: Once | INTRAMUSCULAR | Status: AC | PRN
  Administered 2013-04-09: 50 mL via ORAL

## 2013-04-09 NOTE — Consult Note (Signed)
PULMONARY/CCM CONSULT NOTE  Requesting MD/Service: [place]/Onc Date of admission: 6/14 Date of consult:  6/18 Reason for consultation: Lung mass and post obstructive changes  HPI:  89M long time smoker had been undergoing evaluation of exertional dyspnea as outpt and was found to have probable lung mass on CXR and severe anemia which prompted admission. With regard to resp symptoms, he has had increased exertional dyspnea for 2-3 wks leading up to admission. He denies CP, hemoptysis, orthopnea, PND, LE edema and calf tenderness. He denies wt loss.  Past Medical History  Diagnosis Date  . Shingles outbreak 06/16/10    Lt anterior and postior thigh/buttocks  . Hypertension   . Tobacco abuse     MEDICATIONS: reviewed  History   Social History  . Marital Status: Married    Spouse Name: N/A    Number of Children: N/A  . Years of Education: N/A   Occupational History  . Not on file.   Social History Main Topics  . Smoking status: Current Every Day Smoker -- 1.50 packs/day for 60 years    Types: Cigarettes  . Smokeless tobacco: Not on file  . Alcohol Use: No  . Drug Use: No  . Sexually Active: No   Other Topics Concern  . Not on file   Social History Narrative  . No narrative on file    History reviewed. No pertinent family history.  ROS - as per HPI. Otherwise, N/C  Filed Vitals:   04/08/13 2138 04/09/13 0609 04/09/13 1426 04/09/13 2136  BP: 130/72 148/74 138/83 148/75  Pulse: 80 84 87 80  Temp: 98.1 F (36.7 C) 98.2 F (36.8 C) 98.2 F (36.8 C) 98.1 F (36.7 C)  TempSrc: Oral Oral Oral Oral  Resp: 20 20 18 18   Height:      Weight:      SpO2: 93% 90% 97% 96%    EXAM:  Gen: thin, not cachectic. NAD. Pleasant HEENT: EOMI, PERRL, WNL Neck: No adenopathy or JVD Lungs: dull with diminished BS in LLL approx 1/3 up, few rhonchi, no wheezes Cardiovascular: RRR s M Abdomen: soft, NT, NABS Ext: no C/C/E Neuro: no focal deficits  DATA:  I have reviewed all  the lab results.  CBC    Component Value Date/Time   WBC 22.3* 04/09/2013 0530   RBC 4.63 04/09/2013 0530   HGB 8.5* 04/09/2013 0530   HCT 30.2* 04/09/2013 0530   PLT 410* 04/09/2013 0530   MCV 65.2* 04/09/2013 0530   MCH 18.4* 04/09/2013 0530   MCHC 28.1* 04/09/2013 0530   RDW 27.1* 04/09/2013 0530   LYMPHSABS 11.2* 04/06/2013 1154   MONOABS 1.8* 04/06/2013 1154   EOSABS 0.0 04/06/2013 1154   BASOSABS 0.0 04/06/2013 1154    BMET    Component Value Date/Time   NA 134* 04/09/2013 0530   K 3.7 04/09/2013 0530   CL 101 04/09/2013 0530   CO2 26 04/09/2013 0530   GLUCOSE 94 04/09/2013 0530   BUN 11 04/09/2013 0530   CREATININE 0.85 04/09/2013 0530   CALCIUM 8.7 04/09/2013 0530   GFRNONAA 83* 04/09/2013 0530   GFRAA >90 04/09/2013 0530     RADIOLOGY:  CXR 6/12: Emphysema with L infrahilar fullness CXR 6/15: increased opacification of LLL CT chest 6/13: Large subcarinal mass/LAN, probable infrahilar mass with proximal obstruction of LLL bronchus, LLL drowned lung appearance, underlying emphysema  IMPRESSION:   Lung mass with likely obstruction and post obstruction changes, bulky subcarinal adenopathy, probable liver mets...overall picture highly suggestive of  bronchogenic Ca  PLAN:  FOB scheduled for 6/19 @ 11:00  Further eval and mgmt per Onc   Billy Fischer, MD ; [place] [phone].  After 5:30 PM or weekends, call [phone]

## 2013-04-09 NOTE — Telephone Encounter (Signed)
Patient is inpatient and the physician caring for him is consulting with oncology, he wanted to make sure we have not already referred. I advised you put in referral for pulmonology, but not oncology.

## 2013-04-09 NOTE — Progress Notes (Signed)
[facility] PROGRESS NOTE  Cameron Lam. NWG:[id number] DOB: [birth year] DOA: 04/05/2013 PCP: No PCP Per Patient, following with Dr Tonye Beck[facility]    Brief narrative 79 year old male with history of hypertension, active smoker with very  recently diagnosed left total mass with possible right-sided metastases in liver involvement as well presented to the ED with weakness and shortness of breath and activity was found to have a low hemoglobin chest x-ray suggestive of postobstructive pneumonia and admitted to medical floor.   Assessment/Plan: Iron Deficiency Anemia iron panel suggestive of iron deficiency. Recent transfused with 2 units PRBC and Hb improved.  Stool for occult blood negative on 6/14. IV iron ordered by oncology. Monitor H&H in am.  Newly diagnosed lung mass with possible mets to bilateral lung and liver -risk factor includes underlying heavy smoking. Seen on CT chest one week back. Has not had any on close evaluation. I have spoken with Dr. Dorie Rank who provided oncology consult. Plan on bronchoscopy with bx of lung lesion. CT abd and pelvis done showing mets to liver and spleen. Will need PET scan as outpatient.  Post  obstructive pneumonia Empirically treated with IV vancomycin and Levaquin. Leukocytosis slowly improving.  Hypertension Continue HCTZ and amlodipine   Tobacco abuse  counseled on smoking cessation. Will order nicotine patch     Code Status: full Family Communication: Son at bedside  Disposition Plan: home Once stable   Consultants:  Dr Dorie Rank (oncology)  Procedures:  None  Antibiotics:  IV vancomycin and Levaquin (day 3)  HPI/Subjective: Patient seen and examined this morning. Son at bedside. Informs his shortness of breath to be better.  Objective: Filed Vitals:   04/08/13 1954 04/08/13 2138 04/09/13 0609 04/09/13 1426  BP: 140/77 130/72 148/74 138/83  Pulse: 75 80 84 87  Temp:  98.1 F (36.7 C)  98.2 F (36.8 C) 98.2 F (36.8 C)  TempSrc:  Oral Oral Oral  Resp: 18 20 20 18   Height:      Weight:      SpO2: 95% 93% 90% 97%    Intake/Output Summary (Last 24 hours) at 04/09/13 1751 Last data filed at 04/09/13 1449  Gross per 24 hour  Intake   1380 ml  Output   1950 ml  Net   -570 ml   Filed Weights   04/05/13 1824  Weight: 66.225 kg (146 lb)    Exam:   General:  Elderly male in no acute distress  HEENT: pallor+, moist oral mucosa  Chest: Diminished breath sounds over left lower lobe, no rhonchi wheeze  CVS: Normal S1 and S2, no murmurs rub or gallop  Abdomen: Soft, nontender, nondistended, bowel sounds present   extremities: Warm, no edema  CNS: AAO x3  *  Data Reviewed: Basic Metabolic Panel:  Recent Labs Lab 04/04/13 1303 04/05/13 1550 04/06/13 0519 04/09/13 0530  NA 133* 133* 131* 134*  K 3.3* 4.7 3.6 3.7  CL 97 99 99 101  CO2 25 25 22 26   GLUCOSE 81 97 112* 94  BUN 16 16 13 11   CREATININE 0.81 0.79 0.83 0.85  CALCIUM 9.0 8.7 8.3* 8.7   Liver Function Tests:  Recent Labs Lab 04/04/13 1303  AST 13  ALT 9  ALKPHOS 78  BILITOT 0.3  PROT 6.7  ALBUMIN 3.3*   No results found for this basename: LIPASE, AMYLASE,  in the last 168 hours No results found for this basename: AMMONIA,  in the last 168 hours CBC:  Recent  Labs Lab 04/04/13 1303  04/06/13 0606 04/06/13 1154 04/07/13 0448 04/08/13 0535 04/09/13 0530  WBC 20.4*  < > 25.2* 25.5* 27.7* 26.6* 22.3*  NEUTROABS 8.6*  --   --  12.5*  --   --   --   HGB 6.2*  < > 8.5* 8.5* 8.6* 8.5* 8.5*  HCT 22.5*  < > 29.0* 29.0* 29.2* 29.3* 30.2*  MCV 58.4*  < > 63.9* 63.3* 64.0* 64.7* 65.2*  PLT 390  < > 388 385 PLATELET CLUMPS NOTED ON SMEAR, COUNT APPEARS ADEQUATE 395 410*  < > = values in this interval not displayed. Cardiac Enzymes:  Recent Labs Lab 04/05/13 1550  TROPONINI <0.30   BNP (last 3 results) No results found for this basename: PROBNP,  in the last 8760 hours CBG: No  results found for this basename: GLUCAP,  in the last 168 hours  Recent Results (from the past 240 hour(s))  URINE CULTURE     Status: None   Collection Time    04/05/13  6:55 PM      Result Value Range Status   Specimen Description URINE, CLEAN CATCH   Final   Special Requests NONE   Final   Culture  Setup Time 04/06/2013 03:35   Final   Colony Count 55,000 COLONIES/ML   Final   Culture     Final   Value: Multiple bacterial morphotypes present, none predominant. Suggest appropriate recollection if clinically indicated.   Report Status 04/07/2013 FINAL   Final  CULTURE, BLOOD (ROUTINE X 2)     Status: None   Collection Time    04/05/13  6:58 PM      Result Value Range Status   Specimen Description BLOOD LEFT HAND  7 ML IN AEROBIC ONLY   Final   Special Requests NONE   Final   Culture  Setup Time 04/06/2013 02:21   Final   Culture     Final   Value:        BLOOD CULTURE RECEIVED NO GROWTH TO DATE CULTURE WILL BE HELD FOR 5 DAYS BEFORE ISSUING A FINAL NEGATIVE REPORT   Report Status PENDING   Incomplete  CULTURE, BLOOD (ROUTINE X 2)     Status: None   Collection Time    04/05/13  7:02 PM      Result Value Range Status   Specimen Description BLOOD RIGHT ARM  2 ML IN AEROBIC ONLY   Final   Special Requests NONE   Final   Culture  Setup Time 04/06/2013 02:21   Final   Culture     Final   Value:        BLOOD CULTURE RECEIVED NO GROWTH TO DATE CULTURE WILL BE HELD FOR 5 DAYS BEFORE ISSUING A FINAL NEGATIVE REPORT   Report Status PENDING   Incomplete     Studies: Ct Abdomen Pelvis W Contrast  04/09/2013   *RADIOLOGY REPORT*  Clinical Data: Possible lung cancer.  Liver lesions seen on recent chest CT 04/04/2013  CT ABDOMEN AND PELVIS WITH CONTRAST  Technique:  Multidetector CT imaging of the abdomen and pelvis was performed following the standard protocol during bolus administration of intravenous contrast.  Contrast: OMNIPAQUE IOHEXOL 300 MG/ML  SOLN, 50mL OMNIPAQUE IOHEXOL 300  MG/ML  SOLN  Comparison: Chest CT 04/04/2013  Findings:  Lung bases:  Again seen is a dense consolidation throughout the visualized portion of the left lower lobe.  This is likely due to a central obstructing tumor as described  on recent chest CT.  There is now a small left pleural effusion that appears new compared to recent CT.  In the there is new patchy airspace disease in the periphery of the right lower lobe near the right hemidiaphragm.  In the no definite pleural effusion on the right.  Mild cardiomegaly with coronary artery atherosclerotic calcifications.   In the left lobe of the liver is approximately 2 cm irregularly shaped lesion with Hounsfield units of approximately 26.  This appears similar to prior chest CT.  In the posterior right lobe of the liver is a 2.4 cm hypodense lesion with Hounsfield units of 32. Hepatic metastases cannot be excluded.  PET CT could be performed for further characterization.  In the medial spleen is a 2.0 cm hypodense lesion that measures 39 HU.  This is seen to better advantage on today's CT given differences in timing relative to contrast administration. A splenic metastasis cannot be excluded.  There are no prior images the earlier than June 2014 of the abdomen for comparison over time. The spleen is normal in size.  There is a paucity of intra-abdominal fat, which limits the evaluation for acute inflammatory changes.  The gallbladder is decompressed and unremarkable.  There is slight thickening of the limbs of the left adrenal gland, but no discrete adrenal nodule or mass is seen.  The right adrenal gland appears normal.  The pancreas and kidneys are within normal limits.  Bowel loops are normal in caliber.  The urinary bladder is moderately distended and the anterior bladder deviates toward the right of midline.  On image #74 of the axial images, there is a fluid-filled structure with peripheral enhancement in the right inguinal canal that is favored to be herniation  of a small portion of the urinary bladder into the inguinal hernia.  All of the small bowel loops in the abdomen and pelvis are well opacified with oral contrast.  No herniated bowel loops are seen.  The abdominal aorta is normal in caliber contains atherosclerotic calcification.  The prostate gland is enlarged and bulges upward into the bladder base.  No abdominal or pelvic lymphadenopathy is identified.  The imaged vertebral bodies are normal in height and alignment.  No suspicious osseous lesions are identified.  Negative for fracture.  IMPRESSION:  1.  Dense consolidation in the left lower lobe of the lung is likely due to postobstructive pneumonitis in this patient finding with findings suspicious for a central obstructing malignancy in the left lower lobe as described on recent chest CT.  A small left pleural effusion is present, and is new. 2.  Two low-density lesions in the liver are suspicious for hepatic metastases.  These could be further evaluated with PET CT if desired. 3.  2.0 cm hypodense lesion in the medial spleen.  A splenic metastasis cannot be excluded.  Consider evaluation with PET CT. 4.  Probable herniation of a small portion of the anterior urinary bladder into the right inguinal hernia. 5.  Cardiomegaly and coronary artery atherosclerosis.   Original Report Authenticated By: Britta Mccreedy, M.D.    Scheduled Meds: . amLODipine  5 mg Oral Daily  . feeding supplement  237 mL Oral BID BM  . ferrous sulfate  325 mg Oral TID WC  . ferumoxytol  1,020 mg Intravenous Once  . hydrochlorothiazide  12.5 mg Oral Daily  . levofloxacin  750 mg Oral Q24H  . multivitamin with minerals  1 tablet Oral Daily  . sodium chloride  3 mL Intravenous  Q12H  . sodium chloride  3 mL Intravenous Q12H  . vancomycin  1,250 mg Intravenous Q12H   Continuous Infusions:     Time spent: 25 minutes    Geselle Cardosa  [facility] Pager [phone]. If 7PM-7AM, please contact night-coverage at  [email or web address], password [facility] 04/09/2013, 5:51 PM  LOS: 4 days

## 2013-04-09 NOTE — Telephone Encounter (Signed)
Pt's daughter Jencarlos Nicolson would like to discuss results from Ct scan. Best# [phone]

## 2013-04-09 NOTE — Consult Note (Signed)
MEDICAL ONCOLOGY  Reason for Consult: anemia, lung mass, possible liver metastases Referring Physician: hospitalist  Other physicians: Cameron Mater. is an 79 y.o. male seen in consultation at request of hospitalist service, with new diagnoses of iron deficiency anemia, LLL pulmonary mass and possible metastatic disease to liver, all of this found recently when he was evaluated at [facility] for SOB and general weakness. Work up to date includes blood work and CT chest 04-04-13; he has been transfused 3 units of PRBCs and is on antibiotics presently.   Patient has long history of tobacco abuse, beginning ~ age 42, recently decreased to 2-3 cigarettes/ 24 hours. He denies any medical problems until zoster last year. For ~ past 2 weeks he has been more SOB, particularly with climbing stairs. He was seen at [facility] with CXT 04-03-13 showing probable left hilar area mass, CT chest 04-04-13 with LLL mass 5.6 x 6.7 x 6.0 cm with endobronchial extension and occlusion noted, and postobstructive changes in LLL. The CT additionally showed spiculated RUL lesion and indeterminate lesions right and left hepatic lobes. CBC 04-04-13 WBC 20.4, Hgb 6.2 and plt 390k, MCV 58, serum iron <10, ferritin 10, albumin low but LFTs ok. He was admitted to hospitalist service on 04-05-12 and is on empiric po levaquin + IV vancomycin. He feels much better today, hgb up to 8.5 after 3 units PRBCs.  REVIEW OF SYSTEMS Cough x 2 weeks productive of white sputum. No fever. Increased SOB especially with stairs. Patient reports weight stable, usual ~ 140 lbs. Good appetite, no GERD or N/V, No change in bowels or noted blood in stools. Has hemorrhoids, denies bleeding. No HA.No pain, Reading glasses, partial dentures. No cardiac symptoms, no thyroid history, no blood clots, no prior blood transfusions.   Allergies: No Known Allergies Past Medical History  Diagnosis Date  . Shingles outbreak 06/16/10     Lt anterior and postior thigh/buttocks  . Hypertension   . Tobacco abuse     History reviewed. No pertinent past surgical history. no past surgery  History reviewed. No pertinent family history. Father died with heart disease, possibly aneurysm. One sister with cancer, type not known. 5 children healthy. Siblings with ETOH and drug problems.  Social History:  reports that he has been smoking Cigarettes.  He has a 90 pack-year smoking history. He does not have any smokeless tobacco history on file. He reports that he does not drink alcohol or use illicit drugs. Originally from [place], [place], in [place] x 70 years. Lives in [place] with wife, 1 child in [place] and 4 children locally. Retired from work with [employer]. 90 pack year smoking history, recently just 2-3 cigarettes daily and has been able to stop smoking for periods of time in past.   Medications: reviewed in EMR  Blood pressure 148/74, pulse 84, temperature 98.2 F (36.8 C), temperature source Oral, resp. rate 20, height 5\' 7"  (1.702 m), weight 146 lb (66.225 kg), SpO2 90.00%. Pleasant , alert, talkative gentleman looks stated age, NAD seated in bed on RA, changes positions easily. Respirations not labored RA. HEENT: PERRL, not icteric. Oral mucosa moist and clear, no lesions. Partially edentulous. Lungs with decreased BS and dullness left lower 1/2 lung field, no use of accessory muscles, no wheezes or rales. Back not tender. Lymphatics: no cervical, supraclavicular, axillary adenopathy. Heart RRR no gallop. Abdomen soft, not tender, no clear HSM or mass, few BS, not distended, not tender epigastrium. LE  no edema, cords, tenderness. Feet warm. Neuro nonfocal on bed exam. Peripheral IV site ok.    Results for orders placed during the hospital encounter of 04/05/13 (from the past 48 hour(s))  VANCOMYCIN, TROUGH     Status: Abnormal   Collection Time    04/07/13  7:00 PM      Result Value Range   Vancomycin Tr 9.1 (*)  10.0 - 20.0 ug/mL  CBC     Status: Abnormal   Collection Time    04/08/13  5:35 AM      Result Value Range   WBC 26.6 (*) 4.0 - 10.5 K/uL   RBC 4.53  4.22 - 5.81 MIL/uL   Hemoglobin 8.5 (*) 13.0 - 17.0 g/dL   HCT 29.5 (*) 62.1 - 30.8 %   MCV 64.7 (*) 78.0 - 100.0 fL   MCH 18.8 (*) 26.0 - 34.0 pg   MCHC 29.0 (*) 30.0 - 36.0 g/dL   RDW 65.7 (*) 84.6 - 96.2 %   Platelets 395  150 - 400 K/uL  BASIC METABOLIC PANEL     Status: Abnormal   Collection Time    04/09/13  5:30 AM      Result Value Range   Sodium 134 (*) 135 - 145 mEq/L   Potassium 3.7  3.5 - 5.1 mEq/L   Chloride 101  96 - 112 mEq/L   CO2 26  19 - 32 mEq/L   Glucose, Bld 94  70 - 99 mg/dL   BUN 11  6 - 23 mg/dL   Creatinine, Ser 9.52  0.50 - 1.35 mg/dL   Calcium 8.7  8.4 - 84.1 mg/dL   GFR calc non Af Amer 83 (*) >90 mL/min   GFR calc Af Amer >90  >90 mL/min   Comment:            The eGFR has been calculated     using the CKD EPI equation.     This calculation has not been     validated in all clinical     situations.     eGFR's persistently     <90 mL/min signify     possible Chronic Kidney Disease.  CBC     Status: Abnormal   Collection Time    04/09/13  5:30 AM      Result Value Range   WBC 22.3 (*) 4.0 - 10.5 K/uL   RBC 4.63  4.22 - 5.81 MIL/uL   Hemoglobin 8.5 (*) 13.0 - 17.0 g/dL   HCT 32.4 (*) 40.1 - 02.7 %   MCV 65.2 (*) 78.0 - 100.0 fL   MCH 18.4 (*) 26.0 - 34.0 pg   MCHC 28.1 (*) 30.0 - 36.0 g/dL   RDW 25.3 (*) 66.4 - 40.3 %   Platelets 410 (*) 150 - 400 K/uL   Hemoccult x 1 negative 04-05-13  Blood cultures x 2 pending from 04-05-13.   I have discussed information above with patient now, then with son also on unit, and with Dr Cena Benton also on unit. Consultation called to pulmonary. Patient understands that we are concerned about both the iron deficiency anemia and findings on CT chest. He is in agreement with IV feraheme and CT AP, both ordered.   Assessment/Plan: 1. LLL pulmonary mass in patient  with long history of tobacco abuse: tolerating off cigarettes very well. Will ask pulmonary to see, ? Diagnostic bronchoscopy 2.post obstructive pneumonia on antibiotics 3. Iron deficiency anemia: source of blood loss not clear. Post transfusion  of 3 units PRBCs, and in agreement with IV feraheme. Suggest GI evaluation also, which I have not called now.CT AP ordered 4.long tobacco: education done, have encouraged him to stay off of cigarettes 5.herpes zoster last year 6.poor nutritional status 7.elevated PSA: have included CT pelvis with imaging  Thank you. Will follow work up.  Wladyslawa Disbro P 04/09/2013, 1:12 PM  [phone]

## 2013-04-10 ENCOUNTER — Encounter: Payer: Self-pay | Admitting: Radiation Oncology

## 2013-04-10 ENCOUNTER — Inpatient Hospital Stay (HOSPITAL_COMMUNITY): Payer: Medicare Other

## 2013-04-10 ENCOUNTER — Encounter (HOSPITAL_COMMUNITY)
Admission: EM | Disposition: A | Discharge status: Home / Self Care | Payer: Self-pay | Source: Home / Self Care | Attending: Family Medicine

## 2013-04-10 ENCOUNTER — Ambulatory Visit
Admit: 2013-04-10 | Discharge: 2013-04-10 | Disposition: A | Discharge status: Home / Self Care | Payer: Medicare Other | Attending: Radiation Oncology | Admitting: Radiation Oncology

## 2013-04-10 DIAGNOSIS — J189 Pneumonia, unspecified organism: Secondary | ICD-10-CM

## 2013-04-10 HISTORY — PX: VIDEO BRONCHOSCOPY: SHX5072

## 2013-04-10 LAB — CBC
HCT: 28.7 % — ABNORMAL LOW (ref 39.0–52.0)
Hemoglobin: 8.4 g/dL — ABNORMAL LOW (ref 13.0–17.0)
MCHC: 29.3 g/dL — ABNORMAL LOW (ref 30.0–36.0)
RBC: 4.42 MIL/uL (ref 4.22–5.81)
WBC: 18.8 10*3/uL — ABNORMAL HIGH (ref 4.0–10.5)

## 2013-04-10 SURGERY — BRONCHOSCOPY, WITH FLUOROSCOPY
Anesthesia: Moderate Sedation | Laterality: Bilateral

## 2013-04-10 MED ORDER — PHENYLEPHRINE HCL 0.25 % NA SOLN
NASAL | Status: DC | PRN
  Administered 2013-04-10: 1 via NASAL

## 2013-04-10 MED ORDER — MIDAZOLAM HCL 10 MG/2ML IJ SOLN
INTRAMUSCULAR | Status: DC | PRN
  Administered 2013-04-10: 4 mg via INTRAVENOUS

## 2013-04-10 MED ORDER — ACETYLCYSTEINE 20 % IN SOLN
RESPIRATORY_TRACT | Status: DC | PRN
  Administered 2013-04-10: 20 mL via ORAL

## 2013-04-10 MED ORDER — LIDOCAINE HCL 2 % EX GEL
CUTANEOUS | Status: DC | PRN
  Administered 2013-04-10: 1

## 2013-04-10 MED ORDER — FENTANYL CITRATE 0.05 MG/ML IJ SOLN
INTRAMUSCULAR | Status: DC | PRN
  Administered 2013-04-10: 50 ug via INTRAVENOUS

## 2013-04-10 MED ORDER — LIDOCAINE HCL 1 % IJ SOLN
INTRAMUSCULAR | Status: DC | PRN
  Administered 2013-04-10: 6 mL

## 2013-04-10 MED ORDER — SODIUM CHLORIDE 0.9 % IV SOLN
INTRAVENOUS | Status: DC
  Administered 2013-04-10 – 2013-04-11 (×2): via INTRAVENOUS

## 2013-04-10 NOTE — Progress Notes (Signed)
[facility] PROGRESS NOTE  Cameron Lam. ZOX:[id number] DOB: 1934-07-18 DOA: 04/05/2013 PCP: No PCP Per Patient  Brief narrative  79 year old male with history of hypertension, active smoker with very recently diagnosed left total mass with possible right-sided metastases in liver involvement as well presented to the ED with weakness and shortness of breath and activity was found to have a low hemoglobin chest x-ray suggestive of postobstructive pneumonia and admitted to medical floor.   Assessment/Plan:   Iron Deficiency Anemia  iron panel suggestive of iron deficiency.  transfused with 2 units PRBC and  Hb improved.  Stool for occult blood negative on 6/14. IV iron ordered by oncology. H&H low but stable. -will need GI evaluation as outpatient.  Newly diagnosed lung mass with possible mets to bilateral lung and liver  -risk factor includes underlying heavy smoking.  Seen on CT chest one week back. Appreciate oncology eval.  CT abd and pelvis done showing mets to liver and spleen. pulm consult appreciated bronchoscopy with bx done. Bronchoscopy shows endobronchial tumor at LLL bronchus which was friable.  Follow bx.  Will need PET scan as outpatient.  -radiation onc consulted by Dr Dorie Rank  Post obstructive pneumonia  Empirically treated with IV vancomycin and Levaquin. Leukocytosis slowly improving.   Hypertension  Continue HCTZ and amlodipine   Tobacco abuse  counseled on smoking cessation. ordered nicotine patch   Code Status: full  Family Communication: Son at bedside   Disposition Plan: home Once stable   Consultants:  Dr Dorie Rank (oncology) Procedures:  None Antibiotics:  IV vancomycin and Levaquin (day 3)   HPI/Subjective: Patient seen and examined this am. No overnight issues  Objective: Filed Vitals:   04/10/13 1140 04/10/13 1225 04/10/13 1235 04/10/13 1245  BP:  158/91 148/89 153/86  Pulse:  90 86 82  Temp:  97.6 F (36.4 C)    TempSrc:  Oral     Resp:  29 24 23   Height:      Weight:      SpO2: 92% 92% 90% 92%    Intake/Output Summary (Last 24 hours) at 04/10/13 1316 Last data filed at 04/10/13 0900  Gross per 24 hour  Intake    480 ml  Output    925 ml  Net   -445 ml   Filed Weights   04/05/13 1824  Weight: 66.225 kg (146 lb)    Exam:  General: Elderly male in no acute distress  HEENT: pallor+, moist oral mucosa  Chest: Diminished breath sounds over left lower lobe, no rhonchi wheeze  CVS: Normal S1 and S2, no murmurs rub or gallop  Abdomen: Soft, nontender, nondistended, bowel sounds present  extremities: Warm, no edema  CNS: AAO x3   Data Reviewed: Basic Metabolic Panel:  Recent Labs Lab 04/04/13 1303 04/05/13 1550 04/06/13 0519 04/09/13 0530  NA 133* 133* 131* 134*  K 3.3* 4.7 3.6 3.7  CL 97 99 99 101  CO2 25 25 22 26   GLUCOSE 81 97 112* 94  BUN 16 16 13 11   CREATININE 0.81 0.79 0.83 0.85  CALCIUM 9.0 8.7 8.3* 8.7   Liver Function Tests:  Recent Labs Lab 04/04/13 1303  AST 13  ALT 9  ALKPHOS 78  BILITOT 0.3  PROT 6.7  ALBUMIN 3.3*   No results found for this basename: LIPASE, AMYLASE,  in the last 168 hours No results found for this basename: AMMONIA,  in the last 168 hours CBC:  Recent Labs Lab 04/04/13 1303  04/06/13 1154  04/07/13 0448 04/08/13 0535 04/09/13 0530 04/10/13 0605  WBC 20.4*  < > 25.5* 27.7* 26.6* 22.3* 18.8*  NEUTROABS 8.6*  --  12.5*  --   --   --   --   HGB 6.2*  < > 8.5* 8.6* 8.5* 8.5* 8.4*  HCT 22.5*  < > 29.0* 29.2* 29.3* 30.2* 28.7*  MCV 58.4*  < > 63.3* 64.0* 64.7* 65.2* 64.9*  PLT 390  < > 385 PLATELET CLUMPS NOTED ON SMEAR, COUNT APPEARS ADEQUATE 395 410* 417*  < > = values in this interval not displayed. Cardiac Enzymes:  Recent Labs Lab 04/05/13 1550  TROPONINI <0.30   BNP (last 3 results) No results found for this basename: PROBNP,  in the last 8760 hours CBG: No results found for this basename: GLUCAP,  in the last 168 hours  Recent  Results (from the past 240 hour(s))  URINE CULTURE     Status: None   Collection Time    04/05/13  6:55 PM      Result Value Range Status   Specimen Description URINE, CLEAN CATCH   Final   Special Requests NONE   Final   Culture  Setup Time 04/06/2013 03:35   Final   Colony Count 55,000 COLONIES/ML   Final   Culture     Final   Value: Multiple bacterial morphotypes present, none predominant. Suggest appropriate recollection if clinically indicated.   Report Status 04/07/2013 FINAL   Final  CULTURE, BLOOD (ROUTINE X 2)     Status: None   Collection Time    04/05/13  6:58 PM      Result Value Range Status   Specimen Description BLOOD LEFT HAND  7 ML IN AEROBIC ONLY   Final   Special Requests NONE   Final   Culture  Setup Time 04/06/2013 02:21   Final   Culture     Final   Value:        BLOOD CULTURE RECEIVED NO GROWTH TO DATE CULTURE WILL BE HELD FOR 5 DAYS BEFORE ISSUING A FINAL NEGATIVE REPORT   Report Status PENDING   Incomplete  CULTURE, BLOOD (ROUTINE X 2)     Status: None   Collection Time    04/05/13  7:02 PM      Result Value Range Status   Specimen Description BLOOD RIGHT ARM  2 ML IN AEROBIC ONLY   Final   Special Requests NONE   Final   Culture  Setup Time 04/06/2013 02:21   Final   Culture     Final   Value:        BLOOD CULTURE RECEIVED NO GROWTH TO DATE CULTURE WILL BE HELD FOR 5 DAYS BEFORE ISSUING A FINAL NEGATIVE REPORT   Report Status PENDING   Incomplete     Studies: Ct Abdomen Pelvis W Contrast  04/09/2013   *RADIOLOGY REPORT*  Clinical Data: Possible lung cancer.  Liver lesions seen on recent chest CT 04/04/2013  CT ABDOMEN AND PELVIS WITH CONTRAST  Technique:  Multidetector CT imaging of the abdomen and pelvis was performed following the standard protocol during bolus administration of intravenous contrast.  Contrast: OMNIPAQUE IOHEXOL 300 MG/ML  SOLN, 50mL OMNIPAQUE IOHEXOL 300 MG/ML  SOLN  Comparison: Chest CT 04/04/2013  Findings:  Lung bases:  Again  seen is a dense consolidation throughout the visualized portion of the left lower lobe.  This is likely due to a central obstructing tumor as described on recent chest CT.  There is  now a small left pleural effusion that appears new compared to recent CT.  In the there is new patchy airspace disease in the periphery of the right lower lobe near the right hemidiaphragm.  In the no definite pleural effusion on the right.  Mild cardiomegaly with coronary artery atherosclerotic calcifications.   In the left lobe of the liver is approximately 2 cm irregularly shaped lesion with Hounsfield units of approximately 26.  This appears similar to prior chest CT.  In the posterior right lobe of the liver is a 2.4 cm hypodense lesion with Hounsfield units of 32. Hepatic metastases cannot be excluded.  PET CT could be performed for further characterization.  In the medial spleen is a 2.0 cm hypodense lesion that measures 39 HU.  This is seen to better advantage on today's CT given differences in timing relative to contrast administration. A splenic metastasis cannot be excluded.  There are no prior images the earlier than June 2014 of the abdomen for comparison over time. The spleen is normal in size.  There is a paucity of intra-abdominal fat, which limits the evaluation for acute inflammatory changes.  The gallbladder is decompressed and unremarkable.  There is slight thickening of the limbs of the left adrenal gland, but no discrete adrenal nodule or mass is seen.  The right adrenal gland appears normal.  The pancreas and kidneys are within normal limits.  Bowel loops are normal in caliber.  The urinary bladder is moderately distended and the anterior bladder deviates toward the right of midline.  On image #74 of the axial images, there is a fluid-filled structure with peripheral enhancement in the right inguinal canal that is favored to be herniation of a small portion of the urinary bladder into the inguinal hernia.  All of  the small bowel loops in the abdomen and pelvis are well opacified with oral contrast.  No herniated bowel loops are seen.  The abdominal aorta is normal in caliber contains atherosclerotic calcification.  The prostate gland is enlarged and bulges upward into the bladder base.  No abdominal or pelvic lymphadenopathy is identified.  The imaged vertebral bodies are normal in height and alignment.  No suspicious osseous lesions are identified.  Negative for fracture.  IMPRESSION:  1.  Dense consolidation in the left lower lobe of the lung is likely due to postobstructive pneumonitis in this patient finding with findings suspicious for a central obstructing malignancy in the left lower lobe as described on recent chest CT.  A small left pleural effusion is present, and is new. 2.  Two low-density lesions in the liver are suspicious for hepatic metastases.  These could be further evaluated with PET CT if desired. 3.  2.0 cm hypodense lesion in the medial spleen.  A splenic metastasis cannot be excluded.  Consider evaluation with PET CT. 4.  Probable herniation of a small portion of the anterior urinary bladder into the right inguinal hernia. 5.  Cardiomegaly and coronary artery atherosclerosis.   Original Report Authenticated By: Britta Mccreedy, M.D.    Scheduled Meds: . amLODipine  5 mg Oral Daily  . feeding supplement  237 mL Oral BID BM  . ferrous sulfate  325 mg Oral TID WC  . hydrochlorothiazide  12.5 mg Oral Daily  . levofloxacin  750 mg Oral Q24H  . multivitamin with minerals  1 tablet Oral Daily  . nicotine  14 mg Transdermal Daily  . sodium chloride  3 mL Intravenous Q12H  . sodium chloride  3  mL Intravenous Q12H  . vancomycin  1,250 mg Intravenous Q12H   Continuous Infusions: . sodium chloride 20 mL/hr at 04/10/13 1130      Time spent: 25 minutes    Aysiah Jurado  [facility] Pager [phone] If 7PM-7AM, please contact night-coverage at [email or web address], password [facility] 04/10/2013,  1:16 PM  LOS: 5 days

## 2013-04-10 NOTE — Procedures (Addendum)
Indication:  Lung mass   Sedation:  Midaz 4 mg Fentanyl 50 mcg  Anesthesia: Nebulized lidocaine 8 cc Topical lidocaine gel to nose   Procedure: After adequate sedation and anesthesia, the bronchoscope was introduced via the R naris and advanced into the posterior pharynx. Further anesthesia was obtained with 1% lidocaine and the scope was advanced into the trachea. Complete airway anesthesia was achieved with 1% lidocaine and a thorough airway examination was performed. This revealed the following.  Findings:  Endobronchial tumor @ takeoff of LLL bronchus - see accompanying picture  Specimens:   Brushings Washings  EBBX X 4  Cytology and surg path  Post procedure evaluation:  The patient tolerated the procedure well with no major complications. 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 The tumor broke apart very easily with bx suggesting necrosis. Nonetheless, I think we should have diagnostic tissue here. Discussed with Dr Darrold Span who will contact Rad Onc  Billy Fischer, MD;  PCCM service; Mobile [phone]

## 2013-04-10 NOTE — Progress Notes (Signed)
Video Bronchoscopy done  Intervention Bronchial washing done Intervention Bronchial brushing done Intervention Bronchial biopsy done  Procedure tolerated well   Cameron Fischer, MD ; [facility] service Mobile [phone].  After 5:30 PM or weekends, call [phone]

## 2013-04-10 NOTE — Progress Notes (Signed)
Medical Oncology  Discussed with Dr Sung Amabile by phone after bronchoscopy today, path pending from biopsies of obstructing endobronchial mass. Consultation called to Radiation Oncology now.  Ila Mcgill, MD

## 2013-04-10 NOTE — Progress Notes (Signed)
ANTIBIOTIC CONSULT NOTE - Follow UP  Pharmacy Consult for Vancomycin, Levaquin Indication: postobstructivepneumonia  No Known Allergies  Patient Measurements: Height: 5\' 7"  (170.2 cm) Weight: 146 lb (66.225 kg) IBW/kg (Calculated) : 66.1  Vital Signs: Temp: 98.2 F (36.8 C) (06/19 0517) Temp src: Oral (06/19 0517) BP: 180/108 mmHg (06/19 1055) Pulse Rate: 91 (06/19 1055) Intake/Output from previous day: 06/18 0701 - 06/19 0700 In: 720 [P.O.:720] Out: 925 [Urine:925] Intake/Output from this shift:    Labs:  Recent Labs  04/08/13 0535 04/09/13 0530 04/10/13 0605  WBC 26.6* 22.3* 18.8*  HGB 8.5* 8.5* 8.4*  PLT 395 410* 417*  CREATININE  --  0.85  --    Estimated Creatinine Clearance: 69.1 ml/min (by C-G formula based on Cr of 0.85).  Recent Labs  04/07/13 1900  VANCOTROUGH 9.1*      Assessment: 78 yom presented to Urgent Care 6/12 with SOB, hypertension. Lab work done, revealed Hgb 6.2 and chest CT with lung mass concerning for metastases - MD recommended ED visit. Pt presented 6/14 to ED for transfusion, found Hgb 5.6, leukocytosis, tachycardic, tachypnic, elevated troponin - met SIRS criteria per MD so Vanc and Zosyn started empirically for r/o sepsis. Zosyn narrowed to Levaquin on 6/16.    Today is D#6 Vancomycin, D#4 Levaquin.  Patient is afebrile, WBC improved to 18.8K, Scr 0.85 for CG CrCl 70 ml/min, normalized 76 ml/min. Blood cultures with NGTD, urine cx with 55K multiple organims.  Vancomycin trough obtained and dose adjusted 6/16.   CXT 6/15 with progression of lung densities compared to 6/12. CT abd/pelvis 6/18 with likely postobstructive pneumonitis suspicious for malignancy, ?hepatic and ?spleen metastases. Oncology on board, diagnostic bronchoscopy scheduled for 6/19 by pulmonary then possible discharge tomorrow.    Plan:   Continue Vancomycin 1250 mg IV q12 and Levaquin 750 mg po q24h  If not discharged tomorrow and abx not narrowed - pharmacy  will obtain vancomycin trough tomorrow.   Geoffry Paradise, PharmD, BCPS Pager: [phone] 11:19 AM Pharmacy #: [id number]

## 2013-04-10 NOTE — Progress Notes (Signed)
Radiation Oncology         [phone] ________________________________  Initial In-patient Consultation  Name: Cameron Lam. MRN: [id number]  Date: 04/10/2013  DOB: 05/09/1934  CC:No PCP Per Patient  [name], MD   REFERRING PHYSICIAN: [name], MD  DIAGNOSIS: The encounter diagnosis was Postobstructive pneumonia. probable stage IV non-small cell lung cancer  HISTORY OF PRESENT ILLNESS::Cameron Lam. is a 79 y.o. male who is seen out of the courtesy of Dr. Darrold Span for an opinion concerning radiation therapy for what appears to be advanced non-small cell lung cancer. The patient presented recently with shortness of breath. He was seen at urgent care center and then subsequent admitted to the hospital. The patient was found to be anemic hemoglobin of 6.2. On scans as documented below the patient was found to have a large left lower lung mass with postobstructive pneumonitis/pneumonia. Earlier today the patient underwent bronchoscopy by Dr. Darrol Angel and patient was found to have endobronchial tumor at the takeoff of the left lower lobe bronchus. A biopsy was performed this area of. Pathology is pending concerning this biopsy. Radiation therapy is been consulted for consideration for treatments to the chest to aid with tumor shrinkage and to help with issues related to postobstructive pneumonia.   PREVIOUS RADIATION THERAPY: No  PAST MEDICAL HISTORY:  has a past medical history of Shingles outbreak (06/16/10); Hypertension; and Tobacco abuse.    PAST SURGICAL HISTORY:History reviewed. No pertinent past surgical history.  FAMILY HISTORY: family history is not on file.  SOCIAL HISTORY:  reports that he has been smoking Cigarettes.  He has a 90 pack-year smoking history. He does not have any smokeless tobacco history on file. He reports that he does not drink alcohol or use illicit drugs.  ALLERGIES: Review of patient's allergies indicates no known  allergies.  MEDICATIONS:  No current facility-administered medications for this encounter.   No current outpatient prescriptions on file.   Facility-Administered Medications Ordered in Other Encounters  Medication Dose Route Frequency Provider Last Rate Last Dose  . 0.9 %  sodium chloride infusion  250 mL Intravenous PRN Penny Pia, MD 10 mL/hr at 04/06/13 2238 250 mL at 04/06/13 2238  . 0.9 %  sodium chloride infusion   Intravenous Continuous Merwyn Katos, MD 20 mL/hr at 04/10/13 1130    . acetylcysteine (MUCOMYST) 20 % nebulizer / oral solution    PRN Merwyn Katos, MD   20 mL at 04/10/13 1040  . albuterol (PROVENTIL HFA;VENTOLIN HFA) 108 (90 BASE) MCG/ACT inhaler 2 puff  2 puff Inhalation Q6H PRN Penny Pia, MD      . amLODipine (NORVASC) tablet 5 mg  5 mg Oral Daily Penny Pia, MD   5 mg at 04/10/13 0927  . feeding supplement (ENSURE COMPLETE) liquid 237 mL  237 mL Oral BID BM Lorraine Lax, RD   237 mL at 04/10/13 1000  . fentaNYL (SUBLIMAZE) injection    PRN Merwyn Katos, MD   50 mcg at 04/10/13 1134  . ferrous sulfate tablet 325 mg  325 mg Oral TID WC Penny Pia, MD   325 mg at 04/10/13 1318  . hydrochlorothiazide (MICROZIDE) capsule 12.5 mg  12.5 mg Oral Daily Penny Pia, MD   12.5 mg at 04/10/13 0927  . levofloxacin (LEVAQUIN) tablet 750 mg  750 mg Oral Q24H Thuyvan Thi Phan, RPH   750 mg at 04/09/13 1738  . lidocaine (XYLOCAINE) 1 % (with pres) injection  PRN Merwyn Katos, MD   6 mL at 04/10/13 1030  . lidocaine (XYLOCAINE) 2 % jelly    PRN Merwyn Katos, MD   1 application at 04/10/13 1028  . midazolam (VERSED) injection    PRN Merwyn Katos, MD   4 mg at 04/10/13 1134  . multivitamin with minerals tablet 1 tablet  1 tablet Oral Daily Lorraine Lax, RD   1 tablet at 04/10/13 0927  . nicotine (NICODERM CQ - dosed in mg/24 hours) patch 14 mg  14 mg Transdermal Daily Nishant Dhungel, MD   14 mg at 04/10/13 0928  . phenylephrine (NEO-SYNEPHRINE) 0.25 %  nasal spray    PRN Merwyn Katos, MD   1 spray at 04/10/13 1026  . sodium chloride 0.9 % injection 3 mL  3 mL Intravenous Q12H Penny Pia, MD   3 mL at 04/08/13 2158  . sodium chloride 0.9 % injection 3 mL  3 mL Intravenous Q12H Penny Pia, MD   3 mL at 04/07/13 2028  . sodium chloride 0.9 % injection 3 mL  3 mL Intravenous PRN Penny Pia, MD      . vancomycin (VANCOCIN) 1,250 mg in sodium chloride 0.9 % 250 mL IVPB  1,250 mg Intravenous Q12H Loletta Specter, RPH   1,250 mg at 04/10/13 0930    REVIEW OF SYSTEMS:  A 15 point review of systems is documented in the electronic medical record. This was obtained by the nursing staff. However, I reviewed this with the patient to discuss relevant findings and make appropriate changes.  He denies any headaches nausea or blurred vision. Patient has good appetite. He denies any pain in the chest area. He does have a cough which is productive of phlegm. He denies any hemoptysis.  he denies any new bony pain.   PHYSICAL EXAM: This is a very pleasant 79 year old gentleman in no acute distress. He is accompanied by his daughter on evaluation this afternoon. Patient has a heart monitor in place. He responds appropriate to questions. The pupils are equal round and reactive to light. The extraocular eye movements are intact. Patient has bilateral arcus senilis present. Examination of oral cavity reveals poor dentition. There is no secondary infection noted in the oral cavity. Examination of the neck and supraclavicular region reveals no evidence of adenopathy. Axillary areas are free of adenopathy. Examination of the lungs reveals decreased breath sounds in the left lower lung field. The heart has a regular rhythm and rate. The abdomen is soft and nontender with normal bowel sounds. No obvious hepatosplenomegaly. On neurological examination motor strength is 5 out of 5 in the proximal and distal muscle groups in the upper and lower extremities. Peripheral  pulses are good. There is no appreciable edema noted in the extremities.   LABORATORY DATA:  Lab Results  Component Value Date   WBC 18.8* 04/10/2013   HGB 8.4* 04/10/2013   HCT 28.7* 04/10/2013   MCV 64.9* 04/10/2013   PLT 417* 04/10/2013   Lab Results  Component Value Date   NA 134* 04/09/2013   K 3.7 04/09/2013   CL 101 04/09/2013   CO2 26 04/09/2013   Lab Results  Component Value Date   ALT 9 04/04/2013   AST 13 04/04/2013   ALKPHOS 78 04/04/2013   BILITOT 0.3 04/04/2013     RADIOGRAPHY: Dg Chest 2 View  04/03/2013   *RADIOLOGY REPORT*  Clinical Data: Shortness of breath, history hypertension, smoking  CHEST - 2 VIEW  Comparison: None  Findings: Upper normal heart size. Abnormal soft tissue density at the left pulmonary hilum. While a portion of this represents the left pulmonary artery, suspect retro hilar mass extending into the left lower lobe. Underlying emphysematous and bronchitic changes. Bibasilar atelectasis versus scarring. Atelectasis versus consolidation at medial left lung base. Upper lungs clear. No definite pleural effusion or pneumothorax.  IMPRESSION: COPD with suspect left retro hilar mass, cannot exclude tumor and hilar adenopathy. Additional atelectasis versus consolidation in left lower lobe. Further evaluation by computed tomography with IV contrast recommended.  These results will be called to the ordering clinician or representative by the Radiologist Assistant, and communication documented in the PACS Dashboard.   Original Report Authenticated By: Ulyses Southward, M.D.   Ct Chest W Contrast  04/04/2013   *RADIOLOGY REPORT*  Clinical Data: Palpitations and dyspnea  CT CHEST WITH CONTRAST  Technique:  Multidetector CT imaging of the chest was performed following the standard protocol during bolus administration of intravenous contrast.  Contrast: 80mL OMNIPAQUE IOHEXOL 300 MG/ML  SOLN  Comparison: None.  Findings: There is no pleural effusion identified.  Severe changes of   COPD/emphysema identified.  There is a large central obstructing mass occluding the right lower lobe airway.  This measures approximately 5.6 x 6.7 x 6.0 cm.  Central areas of low attenuation are noted suggestive of necrosis.   Endobronchial extension and occlusion of this tumor is noted, image 34/series 3. There is postobstructive consolidation of the much of the left lower lobe.  Spiculated right upper lobe perihilar lesion measures 1.9 cm. There is a right upper lobe spiculated lesion measuring 1.1 cm, image 31/series 3.  Normal heart size.  No pericardial effusion.  The subcarinal lymph node is enlarged measuring 2.5 cm, image 33/series 2.  Limited imaging through the upper abdomen shows an indeterminant low attenuation lesion in the left hepatic lobe measuring 1.9 cm, image 60/series 2.  Within the right hepatic lobe there is an indeterminate lesion measuring 2.3 cm, image 56/series 2.  Review of the visualized bony structures shows no aggressive lytic or sclerotic bone lesions.  IMPRESSION:  1.  Central obstructing mass within the left lower lobe is present. There are several, suspicious spiculated lesions within the right lung which are concerning for metastases.  If this is a non-small cell lung cancer then this would be considered at least a T2bN2M1a. 2.  Indeterminant lesions within the liver.  Consider further evaluation with PET CT.   Original Report Authenticated By: Signa Kell, M.D.   Ct Abdomen Pelvis W Contrast  04/09/2013   *RADIOLOGY REPORT*  Clinical Data: Possible lung cancer.  Liver lesions seen on recent chest CT 04/04/2013  CT ABDOMEN AND PELVIS WITH CONTRAST  Technique:  Multidetector CT imaging of the abdomen and pelvis was performed following the standard protocol during bolus administration of intravenous contrast.  Contrast: OMNIPAQUE IOHEXOL 300 MG/ML  SOLN, 50mL OMNIPAQUE IOHEXOL 300 MG/ML  SOLN  Comparison: Chest CT 04/04/2013  Findings:  Lung bases:  Again seen is a dense  consolidation throughout the visualized portion of the left lower lobe.  This is likely due to a central obstructing tumor as described on recent chest CT.  There is now a small left pleural effusion that appears new compared to recent CT.  In the there is new patchy airspace disease in the periphery of the right lower lobe near the right hemidiaphragm.  In the no definite pleural effusion on the right.  Mild cardiomegaly with  coronary artery atherosclerotic calcifications.   In the left lobe of the liver is approximately 2 cm irregularly shaped lesion with Hounsfield units of approximately 26.  This appears similar to prior chest CT.  In the posterior right lobe of the liver is a 2.4 cm hypodense lesion with Hounsfield units of 32. Hepatic metastases cannot be excluded.  PET CT could be performed for further characterization.  In the medial spleen is a 2.0 cm hypodense lesion that measures 39 HU.  This is seen to better advantage on today's CT given differences in timing relative to contrast administration. A splenic metastasis cannot be excluded.  There are no prior images the earlier than June 2014 of the abdomen for comparison over time. The spleen is normal in size.  There is a paucity of intra-abdominal fat, which limits the evaluation for acute inflammatory changes.  The gallbladder is decompressed and unremarkable.  There is slight thickening of the limbs of the left adrenal gland, but no discrete adrenal nodule or mass is seen.  The right adrenal gland appears normal.  The pancreas and kidneys are within normal limits.  Bowel loops are normal in caliber.  The urinary bladder is moderately distended and the anterior bladder deviates toward the right of midline.  On image #74 of the axial images, there is a fluid-filled structure with peripheral enhancement in the right inguinal canal that is favored to be herniation of a small portion of the urinary bladder into the inguinal hernia.  All of the small bowel  loops in the abdomen and pelvis are well opacified with oral contrast.  No herniated bowel loops are seen.  The abdominal aorta is normal in caliber contains atherosclerotic calcification.  The prostate gland is enlarged and bulges upward into the bladder base.  No abdominal or pelvic lymphadenopathy is identified.  The imaged vertebral bodies are normal in height and alignment.  No suspicious osseous lesions are identified.  Negative for fracture.  IMPRESSION:  1.  Dense consolidation in the left lower lobe of the lung is likely due to postobstructive pneumonitis in this patient finding with findings suspicious for a central obstructing malignancy in the left lower lobe as described on recent chest CT.  A small left pleural effusion is present, and is new. 2.  Two low-density lesions in the liver are suspicious for hepatic metastases.  These could be further evaluated with PET CT if desired. 3.  2.0 cm hypodense lesion in the medial spleen.  A splenic metastasis cannot be excluded.  Consider evaluation with PET CT. 4.  Probable herniation of a small portion of the anterior urinary bladder into the right inguinal hernia. 5.  Cardiomegaly and coronary artery atherosclerosis.   Original Report Authenticated By: Britta Mccreedy, M.D.   Dg Chest Port 1 View  04/06/2013   *RADIOLOGY REPORT*  Clinical Data: Shortness of breath after transfusion.  PORTABLE CHEST - 1 VIEW  Comparison: 04/03/2013 and CT from 04/04/2013  Findings: Portable upright view of the chest demonstrates densities in the left lower chest.  There may also be left pleural fluid. Upper lungs remain clear. Heart size is grossly stable.  Again note is fullness in the left hilar region related to the known opacity or lesion.  IMPRESSION: Densities in the left mid and lower chest.  Findings are similar to the recent CT but there has been progression since 04/03/2013. Findings could represent postobstructive pneumonitis and/or volume loss.  There may be  pleural fluid.   Original Report Authenticated By:  Richarda Overlie, M.D.      IMPRESSION: Probable stage IV non-small cell lung cancer. The biopsy is pending at this time. Patient would be a candidate for palliative radiation therapy directed at this large left lower lung mass and endobronchial tumor as noted above..  This would be to aid in tumor shrinkage and to help with issues related to postobstructive pneumonia/pneumonitis.  PLAN: The patient we brought down from the inpatient unit tomorrow for planning for his radiation therapy.  The patient will return on Tuesday, June 24 to start  his radiation therapy assuming his biopsy results are back on Monday, June 23.   I anticipate between 3 and 5 weeks of radiation therapy as part of his overall management.  I spent 40 minutes minutes face to face with the patient and more than 50% of that time was spent in counseling and/or coordination of care.   ------------------------------------------------  -----------------------------------  Billie Lade, PhD, MD

## 2013-04-10 NOTE — Progress Notes (Signed)
Dr Gonzella Lex called GI consult for iron deficiency anemia (IDA) to consider colonoscopy/EGD.  Low ferritin, low iron and hgb 6.2 pre transfusion of 2 units PRBCs, 8.5 and stable following transfusions. Pt is FOB negative.    Today pt has undergone bronchoscopy with biopsy of left endobronchial tumor, Dr Sung Amabile suspects squamous cell cancer.   Dr Darrold Span involved as of 6/18 when lung mass and probable liver, spleen mets were uncovered by CT chest (6/13) and CT abd/pelvis (6/18).  Case was discussed with Dr Russella Lam.  At this point there is not a clear benefit of proceeding with endoscopic (colon/EGD) evaluation of FOB negative IDA given his newly diagnosed metastatic lung cancer.  If he develops overt or persistent occult GI bleeding we will be happy to re-evaluate. For now pt's care will be focusing on management/treatment of his metastatic lung cancer. I did not make outpt appointment, will let oncologists directing his future care decide if GI re-assessment is necessary.    Thank you, Jennye Moccasin PA-C  Cameron Shamblin T. Russella Dar MD Clementeen Graham

## 2013-04-11 ENCOUNTER — Ambulatory Visit
Admit: 2013-04-11 | Discharge: 2013-04-11 | Disposition: A | Discharge status: Home / Self Care | Payer: Medicare Other | Attending: Radiation Oncology | Admitting: Radiation Oncology

## 2013-04-11 ENCOUNTER — Encounter (HOSPITAL_COMMUNITY): Payer: Self-pay | Admitting: Pulmonary Disease

## 2013-04-11 ENCOUNTER — Inpatient Hospital Stay (HOSPITAL_COMMUNITY): Payer: Medicare Other

## 2013-04-11 DIAGNOSIS — Z79899 Other long term (current) drug therapy: Secondary | ICD-10-CM | POA: Insufficient documentation

## 2013-04-11 DIAGNOSIS — Z7982 Long term (current) use of aspirin: Secondary | ICD-10-CM | POA: Insufficient documentation

## 2013-04-11 DIAGNOSIS — Z510 Encounter for antineoplastic radiation therapy: Secondary | ICD-10-CM | POA: Insufficient documentation

## 2013-04-11 DIAGNOSIS — J189 Pneumonia, unspecified organism: Secondary | ICD-10-CM

## 2013-04-11 DIAGNOSIS — R000 Tachycardia, unspecified: Secondary | ICD-10-CM | POA: Insufficient documentation

## 2013-04-11 DIAGNOSIS — C3490 Malignant neoplasm of unspecified part of unspecified bronchus or lung: Secondary | ICD-10-CM | POA: Insufficient documentation

## 2013-04-11 LAB — CBC
HCT: 30.7 % — ABNORMAL LOW (ref 39.0–52.0)
Hemoglobin: 8.8 g/dL — ABNORMAL LOW (ref 13.0–17.0)
MCH: 18.8 pg — ABNORMAL LOW (ref 26.0–34.0)
MCV: 65.6 fL — ABNORMAL LOW (ref 78.0–100.0)
Platelets: 446 10*3/uL — ABNORMAL HIGH (ref 150–400)
RBC: 4.68 MIL/uL (ref 4.22–5.81)
WBC: 26.2 10*3/uL — ABNORMAL HIGH (ref 4.0–10.5)

## 2013-04-11 NOTE — Progress Notes (Signed)
Problem: lung mass  SUBJ: No new complaints. No distress   OBJ: Filed Vitals:   04/11/13 0457  BP: 125/88  Pulse: 89  Temp: 98.1 F (36.7 C)  Resp: 18   EXAM:  Gen: thin, not cachectic. NAD. Pleasant  HEENT: EOMI, PERRL, WNL  Neck: No adenopathy or JVD  Lungs: dull with diminished BS in LLL approx 1/3 up, few rhonchi, no wheezes  Cardiovascular: RRR s M  Abdomen: soft, NT, NABS  Ext: no C/C/E  Neuro: no focal deficits  Surg path: positive for invasive sq cell ca   IMPRESSION: LLL obstruction due to NSCCa -   LLL collapse - i think most of the "mass" seen on CT chest actually represents collapsed LLL with drowned lung/post opbstrutive PNA. During bronch, I removed some thick secretions that partially opened up the bronchus (minimally)   PLAN/REC: Recheck CXR today Further eval and mgmt of ca per onc/rad onc  PCCM will sign off. Please call if we can be of further assistance    Billy Fischer, MD ; [facility] [phone].  After 5:30 PM or weekends, call [phone]

## 2013-04-11 NOTE — Progress Notes (Signed)
[facility] PROGRESS NOTE  Cameron Lam. WUJ:[id number] DOB: Oct 08, 1934 DOA: 04/05/2013 PCP: No PCP Per Patient  Brief narrative  79 year old male with history of hypertension, active smoker with very recently diagnosed left total mass with possible right-sided metastases in liver involvement as well presented to the ED with weakness and shortness of breath and activity was found to have a low hemoglobin chest x-ray suggestive of postobstructive pneumonia and admitted to medical floor.   Assessment/Plan:  Iron Deficiency Anemia  iron panel suggestive of iron deficiency. transfused with 3 units PRBC and  Hb improved.  Stool for occult blood negative on 6/14. IV iron ordered by oncology. H&H low but stable.  -Seen by GI consult. No w/up recommended .  Newly diagnosed lung mass with possible mets to bilateral lung and liver  -risk factor includes underlying heavy smoking.  Seen on CT chest one week back. Appreciate oncology eval. CT abd and pelvis done showing mets to liver and spleen.  pulm consult appreciated bronchoscopy with bx done. Bronchoscopy shows endobronchial tumor at LLL bronchus which was friable. Follow bx. Will need PET scan as outpatient.  -plan to start palliative radiation from next week.  Post obstructive pneumonia  Empirically treated with IV vancomycin and Levaquin. Has increased leucocytosis for unclear reasons. Remains afebrile. No diarrhea. repeat CXR is unchanged.   Hypertension  Continue HCTZ and amlodipine   Tobacco abuse  counseled on smoking cessation. ordered nicotine patch   Code Status: full  Family Communication: daughter at bedside Disposition Plan: home Once stable  ( as early as tomorrow if leucocytosis improving). wil follow up with Dr Darrold Span after discharge.    Consultants:  Dr Dorie Rank (oncology) Procedures:  None Antibiotics:  IV vancomycin and Levaquin (day4 )  Subjective:  Patient seen and examined this am. No overnight  issues   Objective: Filed Vitals:   04/10/13 1319 04/10/13 2056 04/11/13 0457 04/11/13 0500  BP: 155/87 126/60 125/88   Pulse: 88 88 89   Temp: 98.3 F (36.8 C) 98 F (36.7 C) 98.1 F (36.7 C)   TempSrc:  Oral Oral   Resp: 22 18 18    Height:      Weight:    66.5 kg (146 lb 9.7 oz)  SpO2: 92% 96% 98%     Intake/Output Summary (Last 24 hours) at 04/11/13 1434 Last data filed at 04/11/13 1237  Gross per 24 hour  Intake   1220 ml  Output   1300 ml  Net    -80 ml   Filed Weights   04/05/13 1824 04/11/13 0500  Weight: 66.225 kg (146 lb) 66.5 kg (146 lb 9.7 oz)    Exam:   General: Elderly male in no acute distress  HEENT: pallor+, moist oral mucosa  Chest: Diminished breath sounds over left lower lobe, no rhonchi wheeze  CVS: Normal S1 and S2, no murmurs rub or gallop  Abdomen: Soft, nontender, nondistended, bowel sounds present  extremities: Warm, no edema  CNS: AAO x3   Data Reviewed: Basic Metabolic Panel:  Recent Labs Lab 04/05/13 1550 04/06/13 0519 04/09/13 0530  NA 133* 131* 134*  K 4.7 3.6 3.7  CL 99 99 101  CO2 25 22 26   GLUCOSE 97 112* 94  BUN 16 13 11   CREATININE 0.79 0.83 0.85  CALCIUM 8.7 8.3* 8.7   Liver Function Tests: No results found for this basename: AST, ALT, ALKPHOS, BILITOT, PROT, ALBUMIN,  in the last 168 hours No results found for this basename: LIPASE,  AMYLASE,  in the last 168 hours No results found for this basename: AMMONIA,  in the last 168 hours CBC:  Recent Labs Lab 04/06/13 1154 04/07/13 0448 04/08/13 0535 04/09/13 0530 04/10/13 0605 04/11/13 0528  WBC 25.5* 27.7* 26.6* 22.3* 18.8* 26.2*  NEUTROABS 12.5*  --   --   --   --   --   HGB 8.5* 8.6* 8.5* 8.5* 8.4* 8.8*  HCT 29.0* 29.2* 29.3* 30.2* 28.7* 30.7*  MCV 63.3* 64.0* 64.7* 65.2* 64.9* 65.6*  PLT 385 PLATELET CLUMPS NOTED ON SMEAR, COUNT APPEARS ADEQUATE 395 410* 417* 446*   Cardiac Enzymes:  Recent Labs Lab 04/05/13 1550  TROPONINI <0.30   BNP (last 3  results) No results found for this basename: PROBNP,  in the last 8760 hours CBG: No results found for this basename: GLUCAP,  in the last 168 hours  Recent Results (from the past 240 hour(s))  URINE CULTURE     Status: None   Collection Time    04/05/13  6:55 PM      Result Value Range Status   Specimen Description URINE, CLEAN CATCH   Final   Special Requests NONE   Final   Culture  Setup Time 04/06/2013 03:35   Final   Colony Count 55,000 COLONIES/ML   Final   Culture     Final   Value: Multiple bacterial morphotypes present, none predominant. Suggest appropriate recollection if clinically indicated.   Report Status 04/07/2013 FINAL   Final  CULTURE, BLOOD (ROUTINE X 2)     Status: None   Collection Time    04/05/13  6:58 PM      Result Value Range Status   Specimen Description BLOOD LEFT HAND  7 ML IN AEROBIC ONLY   Final   Special Requests NONE   Final   Culture  Setup Time 04/06/2013 02:21   Final   Culture     Final   Value:        BLOOD CULTURE RECEIVED NO GROWTH TO DATE CULTURE WILL BE HELD FOR 5 DAYS BEFORE ISSUING A FINAL NEGATIVE REPORT   Report Status PENDING   Incomplete  CULTURE, BLOOD (ROUTINE X 2)     Status: None   Collection Time    04/05/13  7:02 PM      Result Value Range Status   Specimen Description BLOOD RIGHT ARM  2 ML IN AEROBIC ONLY   Final   Special Requests NONE   Final   Culture  Setup Time 04/06/2013 02:21   Final   Culture     Final   Value:        BLOOD CULTURE RECEIVED NO GROWTH TO DATE CULTURE WILL BE HELD FOR 5 DAYS BEFORE ISSUING A FINAL NEGATIVE REPORT   Report Status PENDING   Incomplete     Studies: Dg Chest 2 View  04/11/2013   *RADIOLOGY REPORT*  Clinical Data: Bronchoscopy, 04/10/2013.  Hypertension.  Known left lower lobe mass and consolidation.  CHEST - 2 VIEW  Comparison: 04/06/2013  Findings: Mass-like fullness projects along the inferior left hilum surrounded by hazy airspace opacity that is posterior on the lateral view  consistent with the left lower lobe consolidation and mass noted on the recent CT.  There is a small left pleural effusion.  The lungs are hyperexpanded reflecting COPD.  The lungs are otherwise clear.  The cardiac silhouette is normal in size.  No mediastinal or right hilar masses evident.  IMPRESSION: No acute findings.  No evidence of a complication following bronchoscopy.  Left lower lobe central mass and associated lung consolidation and small effusion is similar to the prior study. Underlying COPD.   Original Report Authenticated By: Amie Portland, M.D.   Ct Abdomen Pelvis W Contrast  04/09/2013   *RADIOLOGY REPORT*  Clinical Data: Possible lung cancer.  Liver lesions seen on recent chest CT 04/04/2013  CT ABDOMEN AND PELVIS WITH CONTRAST  Technique:  Multidetector CT imaging of the abdomen and pelvis was performed following the standard protocol during bolus administration of intravenous contrast.  Contrast: OMNIPAQUE IOHEXOL 300 MG/ML  SOLN, 50mL OMNIPAQUE IOHEXOL 300 MG/ML  SOLN  Comparison: Chest CT 04/04/2013  Findings:  Lung bases:  Again seen is a dense consolidation throughout the visualized portion of the left lower lobe.  This is likely due to a central obstructing tumor as described on recent chest CT.  There is now a small left pleural effusion that appears new compared to recent CT.  In the there is new patchy airspace disease in the periphery of the right lower lobe near the right hemidiaphragm.  In the no definite pleural effusion on the right.  Mild cardiomegaly with coronary artery atherosclerotic calcifications.   In the left lobe of the liver is approximately 2 cm irregularly shaped lesion with Hounsfield units of approximately 26.  This appears similar to prior chest CT.  In the posterior right lobe of the liver is a 2.4 cm hypodense lesion with Hounsfield units of 32. Hepatic metastases cannot be excluded.  PET CT could be performed for further characterization.  In the medial  spleen is a 2.0 cm hypodense lesion that measures 39 HU.  This is seen to better advantage on today's CT given differences in timing relative to contrast administration. A splenic metastasis cannot be excluded.  There are no prior images the earlier than June 2014 of the abdomen for comparison over time. The spleen is normal in size.  There is a paucity of intra-abdominal fat, which limits the evaluation for acute inflammatory changes.  The gallbladder is decompressed and unremarkable.  There is slight thickening of the limbs of the left adrenal gland, but no discrete adrenal nodule or mass is seen.  The right adrenal gland appears normal.  The pancreas and kidneys are within normal limits.  Bowel loops are normal in caliber.  The urinary bladder is moderately distended and the anterior bladder deviates toward the right of midline.  On image #74 of the axial images, there is a fluid-filled structure with peripheral enhancement in the right inguinal canal that is favored to be herniation of a small portion of the urinary bladder into the inguinal hernia.  All of the small bowel loops in the abdomen and pelvis are well opacified with oral contrast.  No herniated bowel loops are seen.  The abdominal aorta is normal in caliber contains atherosclerotic calcification.  The prostate gland is enlarged and bulges upward into the bladder base.  No abdominal or pelvic lymphadenopathy is identified.  The imaged vertebral bodies are normal in height and alignment.  No suspicious osseous lesions are identified.  Negative for fracture.  IMPRESSION:  1.  Dense consolidation in the left lower lobe of the lung is likely due to postobstructive pneumonitis in this patient finding with findings suspicious for a central obstructing malignancy in the left lower lobe as described on recent chest CT.  A small left pleural effusion is present, and is new. 2.  Two low-density lesions in  the liver are suspicious for hepatic metastases.  These  could be further evaluated with PET CT if desired. 3.  2.0 cm hypodense lesion in the medial spleen.  A splenic metastasis cannot be excluded.  Consider evaluation with PET CT. 4.  Probable herniation of a small portion of the anterior urinary bladder into the right inguinal hernia. 5.  Cardiomegaly and coronary artery atherosclerosis.   Original Report Authenticated By: Britta Mccreedy, M.D.    Scheduled Meds: . amLODipine  5 mg Oral Daily  . feeding supplement  237 mL Oral BID BM  . ferrous sulfate  325 mg Oral TID WC  . hydrochlorothiazide  12.5 mg Oral Daily  . levofloxacin  750 mg Oral Q24H  . multivitamin with minerals  1 tablet Oral Daily  . nicotine  14 mg Transdermal Daily  . sodium chloride  3 mL Intravenous Q12H  . sodium chloride  3 mL Intravenous Q12H  . vancomycin  1,250 mg Intravenous Q12H   Continuous Infusions: . sodium chloride 20 mL/hr at 04/11/13 0654      Time spent:25 minutes    Arnita Koons  [facility] Pager [phone]. If 7PM-7AM, please contact night-coverage at [email or web address], password [facility] 04/11/2013, 2:34 PM  LOS: 6 days

## 2013-04-11 NOTE — Progress Notes (Signed)
Spoke with pt's daughter concerning PCP. She will call on Monday to make pt an appointment as a new pt with a PCP.

## 2013-04-12 LAB — CULTURE, BLOOD (ROUTINE X 2)
Culture: NO GROWTH
Culture: NO GROWTH

## 2013-04-12 LAB — CREATININE, SERUM
GFR calc Af Amer: >90 mL/min (ref >90)
GFR calc non Af Amer: 81 mL/min — ABNORMAL LOW (ref >90)

## 2013-04-12 MED ORDER — AMLODIPINE BESYLATE 10 MG PO TABS
10.0000 mg | ORAL_TABLET | Freq: Every day | ORAL | Status: DC
  Administered 2013-04-12 – 2013-04-14 (×3): 10 mg via ORAL
  Filled 2013-04-12 (×3): qty 1

## 2013-04-12 NOTE — Progress Notes (Signed)
ANTIBIOTIC CONSULT NOTE - Follow UP  Pharmacy Consult for Vancomycin, Levaquin Indication: postobstructivepneumonia  No Known Allergies  Patient Measurements: Height: 5\' 7"  (170.2 cm) Weight: 146 lb 9.7 oz (66.5 kg) IBW/kg (Calculated) : 66.1  Vital Signs: Temp: 98.4 F (36.9 C) (06/21 1412) Temp src: Oral (06/21 1412) BP: 128/73 mmHg (06/21 1412) Pulse Rate: 88 (06/21 1412) Intake/Output from previous day: 06/20 0701 - 06/21 0700 In: 1110 [P.O.:360; I.V.:500; IV Piggyback:250] Out: 1452 [Urine:1450; Stool:2] Intake/Output from this shift: Total I/O In: 1320 [P.O.:1320] Out: -   Labs:  Recent Labs  04/10/13 0605 04/11/13 0528 04/12/13 2008  WBC 18.8* 26.2*  --   HGB 8.4* 8.8*  --   PLT 417* 446*  --   CREATININE  --   --  0.89   Estimated Creatinine Clearance: 66 ml/min (by C-G formula based on Cr of 0.89).  Recent Labs  04/12/13 2008  VANCOTROUGH 18.9      Assessment: Cameron Lam presented to Urgent Care 6/12 with SOB, hypertension. Lab work done, revealed Hgb 6.2 and chest CT with lung mass concerning for metastases - MD recommended ED visit. Pt presented 6/14 to ED for transfusion, found Hgb 5.6, leukocytosis, tachycardic, tachypnic, elevated troponin - met SIRS criteria per MD so Vanc and Zosyn started empirically for r/o sepsis. Zosyn narrowed to Levaquin on 6/16.    Today is D#8 Vancomycin, D#6 Levaquin.  Patient is afebrile, WBC 26.2 K, Scr 0.89 for CG CrCl 66 ml/min, normalized 72 ml/min. Blood cultures with NG (final), urine cx with 55K multiple organims.    CXT 6/15 with progression of lung densities compared to 6/12. CT abd/pelvis 6/18 with likely postobstructive pneumonitis suspicious for malignancy, ?hepatic and ?spleen metastases. Oncology on board, diagnostic bronchoscopy scheduled for 6/19 by pulmonary then possible discharge tomorrow.   Vancomycin trough = 18.9 mcg/ml  Goal: Vancomycin trough : 15-20 mcg/ml  Plan:   Continue Vancomycin 1250  mg IV q12 and Levaquin 750 mg po q24h  Terrilee Files, PharmD 04/12/13 @ 21:30

## 2013-04-12 NOTE — Progress Notes (Addendum)
[facility] PROGRESS NOTE  Cameron Lam. AVW:[id number] DOB: 09-26-1934 DOA: 04/05/2013 PCP: No PCP Per Patient  Assessment/Plan: Principal Problem:   Postobstructive pneumonia Active Problems:   Hypertension   Tobacco abuse   COPD (chronic obstructive pulmonary disease)   Anemia, iron deficiency   Leukocytosis   Brief narrative  79 year old male with history of hypertension, active smoker with very recently diagnosed left total mass with possible right-sided metastases in liver involvement as well presented to the ED with weakness and shortness of breath and activity was found to have a low hemoglobin chest x-ray suggestive of postobstructive pneumonia and admitted to medical floor.   Assessment/Plan:  Iron Deficiency Anemia  Low ferritin, low iron and hgb 6.2 pre transfusion of 2 units PRBCs, 8.5 and stable following transfusions. Pt is FOB negative  Stool for occult blood negative on 6/14. IV iron ordered by oncology. H&H low but stable.  -Seen by GI consult. No w/up recommended . No benefit of EGD/colonoscopy because of metastatic cancer .    Newly diagnosed lung mass with possible mets to bilateral lung and liver  -risk factor includes underlying heavy smoking.  Seen on CT chest one week back. Appreciate oncology eval. CT abd and pelvis done showing mets to liver and spleen.  pulm consult appreciated bronchoscopy with bx done. Bronchoscopy shows endobronchial tumor at LLL bronchus which was friable. Likely non-small cell lung cancer. Follow up bx. Will need PET scan as outpatient.  -plan to start palliative radiation from next week by Dr. Roselind Messier .   Post obstructive pneumonia  Empirically treated with IV vancomycin and Levaquin. Has increased leucocytosis for unclear reasons. Remains afebrile. No diarrhea. repeat CXR is unchanged.   Hypertension  Discontinued HCTZ and increase Norvasc to 10 mg a day, because of hyponatremia /dehydration  Tobacco abuse   counseled on smoking cessation. ordered nicotine patch   Code Status: full  Family Communication: daughter at bedside  Disposition Plan: home Once stable ( as early as tomorrow if leucocytosis improving). wil follow up with Dr Darrold Span after discharge.    Consultants:  Dr Dorie Rank (oncology) Procedures:  None Antibiotics:  IV vancomycin and Levaquin (day4 )  Subjective:  Patient seen and examined this am. No overnight issues   Objective: Filed Vitals:   04/11/13 0500 04/11/13 1538 04/11/13 2139 04/12/13 0526  BP:  134/70 136/73 126/58  Pulse:  78 72 90  Temp:  98.7 F (37.1 C) 98.5 F (36.9 C) 98.5 F (36.9 C)  TempSrc:  Oral Oral Oral  Resp:  20 20 18   Height:      Weight: 66.5 kg (146 lb 9.7 oz)     SpO2:  96% 96% 94%    Intake/Output Summary (Last 24 hours) at 04/12/13 0832 Last data filed at 04/12/13 0815  Gross per 24 hour  Intake   1110 ml  Output   2152 ml  Net  -1042 ml    Exam:  HENT:  Head: Atraumatic.  Nose: Nose normal.  Mouth/Throat: Oropharynx is clear and moist.  Eyes: Conjunctivae are normal. Pupils are equal, round, and reactive to light. No scleral icterus.  Neck: Neck supple. No tracheal deviation present.  Cardiovascular: Normal rate, regular rhythm, normal heart sounds and intact distal pulses.  Pulmonary/Chest: Effort normal and breath sounds normal. No respiratory distress.  Abdominal: Soft. Normal appearance and bowel sounds are normal. She exhibits no distension. There is no tenderness.  Musculoskeletal: She exhibits no edema and no tenderness.  Neurological: She is  alert. No cranial nerve deficit.    Data Reviewed: Basic Metabolic Panel:  Recent Labs Lab 04/05/13 1550 04/06/13 0519 04/09/13 0530  NA 133* 131* 134*  K 4.7 3.6 3.7  CL 99 99 101  CO2 25 22 26   GLUCOSE 97 112* 94  BUN 16 13 11   CREATININE 0.79 0.83 0.85  CALCIUM 8.7 8.3* 8.7    Liver Function Tests: No results found for this basename: AST, ALT, ALKPHOS,  BILITOT, PROT, ALBUMIN,  in the last 168 hours No results found for this basename: LIPASE, AMYLASE,  in the last 168 hours No results found for this basename: AMMONIA,  in the last 168 hours  CBC:  Recent Labs Lab 04/06/13 1154 04/07/13 0448 04/08/13 0535 04/09/13 0530 04/10/13 0605 04/11/13 0528  WBC 25.5* 27.7* 26.6* 22.3* 18.8* 26.2*  NEUTROABS 12.5*  --   --   --   --   --   HGB 8.5* 8.6* 8.5* 8.5* 8.4* 8.8*  HCT 29.0* 29.2* 29.3* 30.2* 28.7* 30.7*  MCV 63.3* 64.0* 64.7* 65.2* 64.9* 65.6*  PLT 385 PLATELET CLUMPS NOTED ON SMEAR, COUNT APPEARS ADEQUATE 395 410* 417* 446*    Cardiac Enzymes:  Recent Labs Lab 04/05/13 1550  TROPONINI <0.30   BNP (last 3 results) No results found for this basename: PROBNP,  in the last 8760 hours   CBG: No results found for this basename: GLUCAP,  in the last 168 hours  Recent Results (from the past 240 hour(s))  URINE CULTURE     Status: None   Collection Time    04/05/13  6:55 PM      Result Value Range Status   Specimen Description URINE, CLEAN CATCH   Final   Special Requests NONE   Final   Culture  Setup Time 04/06/2013 03:35   Final   Colony Count 55,000 COLONIES/ML   Final   Culture     Final   Value: Multiple bacterial morphotypes present, none predominant. Suggest appropriate recollection if clinically indicated.   Report Status 04/07/2013 FINAL   Final  CULTURE, BLOOD (ROUTINE X 2)     Status: None   Collection Time    04/05/13  6:58 PM      Result Value Range Status   Specimen Description BLOOD LEFT HAND  7 ML IN AEROBIC ONLY   Final   Special Requests NONE   Final   Culture  Setup Time 04/06/2013 02:21   Final   Culture     Final   Value:        BLOOD CULTURE RECEIVED NO GROWTH TO DATE CULTURE WILL BE HELD FOR 5 DAYS BEFORE ISSUING A FINAL NEGATIVE REPORT   Report Status PENDING   Incomplete  CULTURE, BLOOD (ROUTINE X 2)     Status: None   Collection Time    04/05/13  7:02 PM      Result Value Range Status    Specimen Description BLOOD RIGHT ARM  2 ML IN AEROBIC ONLY   Final   Special Requests NONE   Final   Culture  Setup Time 04/06/2013 02:21   Final   Culture     Final   Value:        BLOOD CULTURE RECEIVED NO GROWTH TO DATE CULTURE WILL BE HELD FOR 5 DAYS BEFORE ISSUING A FINAL NEGATIVE REPORT   Report Status PENDING   Incomplete     Studies: Dg Chest 2 View  04/11/2013   *RADIOLOGY REPORT*  Clinical Data: Bronchoscopy,  04/10/2013.  Hypertension.  Known left lower lobe mass and consolidation.  CHEST - 2 VIEW  Comparison: 04/06/2013  Findings: Mass-like fullness projects along the inferior left hilum surrounded by hazy airspace opacity that is posterior on the lateral view consistent with the left lower lobe consolidation and mass noted on the recent CT.  There is a small left pleural effusion.  The lungs are hyperexpanded reflecting COPD.  The lungs are otherwise clear.  The cardiac silhouette is normal in size.  No mediastinal or right hilar masses evident.  IMPRESSION: No acute findings.  No evidence of a complication following bronchoscopy.  Left lower lobe central mass and associated lung consolidation and small effusion is similar to the prior study. Underlying COPD.   Original Report Authenticated By: Amie Portland, M.D.   Dg Chest 2 View  04/03/2013   *RADIOLOGY REPORT*  Clinical Data: Shortness of breath, history hypertension, smoking  CHEST - 2 VIEW  Comparison: None  Findings: Upper normal heart size. Abnormal soft tissue density at the left pulmonary hilum. While a portion of this represents the left pulmonary artery, suspect retro hilar mass extending into the left lower lobe. Underlying emphysematous and bronchitic changes. Bibasilar atelectasis versus scarring. Atelectasis versus consolidation at medial left lung base. Upper lungs clear. No definite pleural effusion or pneumothorax.  IMPRESSION: COPD with suspect left retro hilar mass, cannot exclude tumor and hilar adenopathy. Additional  atelectasis versus consolidation in left lower lobe. Further evaluation by computed tomography with IV contrast recommended.  These results will be called to the ordering clinician or representative by the Radiologist Assistant, and communication documented in the PACS Dashboard.   Original Report Authenticated By: Ulyses Southward, M.D.   Ct Chest W Contrast  04/04/2013   *RADIOLOGY REPORT*  Clinical Data: Palpitations and dyspnea  CT CHEST WITH CONTRAST  Technique:  Multidetector CT imaging of the chest was performed following the standard protocol during bolus administration of intravenous contrast.  Contrast: 80mL OMNIPAQUE IOHEXOL 300 MG/ML  SOLN  Comparison: None.  Findings: There is no pleural effusion identified.  Severe changes of  COPD/emphysema identified.  There is a large central obstructing mass occluding the right lower lobe airway.  This measures approximately 5.6 x 6.7 x 6.0 cm.  Central areas of low attenuation are noted suggestive of necrosis.   Endobronchial extension and occlusion of this tumor is noted, image 34/series 3. There is postobstructive consolidation of the much of the left lower lobe.  Spiculated right upper lobe perihilar lesion measures 1.9 cm. There is a right upper lobe spiculated lesion measuring 1.1 cm, image 31/series 3.  Normal heart size.  No pericardial effusion.  The subcarinal lymph node is enlarged measuring 2.5 cm, image 33/series 2.  Limited imaging through the upper abdomen shows an indeterminant low attenuation lesion in the left hepatic lobe measuring 1.9 cm, image 60/series 2.  Within the right hepatic lobe there is an indeterminate lesion measuring 2.3 cm, image 56/series 2.  Review of the visualized bony structures shows no aggressive lytic or sclerotic bone lesions.  IMPRESSION:  1.  Central obstructing mass within the left lower lobe is present. There are several, suspicious spiculated lesions within the right lung which are concerning for metastases.  If this is  a non-small cell lung cancer then this would be considered at least a [id number]. 2.  Indeterminant lesions within the liver.  Consider further evaluation with PET CT.   Original Report Authenticated By: Signa Kell, M.D.   Ct Abdomen Pelvis  W Contrast  04/09/2013   *RADIOLOGY REPORT*  Clinical Data: Possible lung cancer.  Liver lesions seen on recent chest CT 04/04/2013  CT ABDOMEN AND PELVIS WITH CONTRAST  Technique:  Multidetector CT imaging of the abdomen and pelvis was performed following the standard protocol during bolus administration of intravenous contrast.  Contrast: OMNIPAQUE IOHEXOL 300 MG/ML  SOLN, 50mL OMNIPAQUE IOHEXOL 300 MG/ML  SOLN  Comparison: Chest CT 04/04/2013  Findings:  Lung bases:  Again seen is a dense consolidation throughout the visualized portion of the left lower lobe.  This is likely due to a central obstructing tumor as described on recent chest CT.  There is now a small left pleural effusion that appears new compared to recent CT.  In the there is new patchy airspace disease in the periphery of the right lower lobe near the right hemidiaphragm.  In the no definite pleural effusion on the right.  Mild cardiomegaly with coronary artery atherosclerotic calcifications.   In the left lobe of the liver is approximately 2 cm irregularly shaped lesion with Hounsfield units of approximately 26.  This appears similar to prior chest CT.  In the posterior right lobe of the liver is a 2.4 cm hypodense lesion with Hounsfield units of 32. Hepatic metastases cannot be excluded.  PET CT could be performed for further characterization.  In the medial spleen is a 2.0 cm hypodense lesion that measures 39 HU.  This is seen to better advantage on today's CT given differences in timing relative to contrast administration. A splenic metastasis cannot be excluded.  There are no prior images the earlier than June 2014 of the abdomen for comparison over time. The spleen is normal in size.  There is a  paucity of intra-abdominal fat, which limits the evaluation for acute inflammatory changes.  The gallbladder is decompressed and unremarkable.  There is slight thickening of the limbs of the left adrenal gland, but no discrete adrenal nodule or mass is seen.  The right adrenal gland appears normal.  The pancreas and kidneys are within normal limits.  Bowel loops are normal in caliber.  The urinary bladder is moderately distended and the anterior bladder deviates toward the right of midline.  On image #74 of the axial images, there is a fluid-filled structure with peripheral enhancement in the right inguinal canal that is favored to be herniation of a small portion of the urinary bladder into the inguinal hernia.  All of the small bowel loops in the abdomen and pelvis are well opacified with oral contrast.  No herniated bowel loops are seen.  The abdominal aorta is normal in caliber contains atherosclerotic calcification.  The prostate gland is enlarged and bulges upward into the bladder base.  No abdominal or pelvic lymphadenopathy is identified.  The imaged vertebral bodies are normal in height and alignment.  No suspicious osseous lesions are identified.  Negative for fracture.  IMPRESSION:  1.  Dense consolidation in the left lower lobe of the lung is likely due to postobstructive pneumonitis in this patient finding with findings suspicious for a central obstructing malignancy in the left lower lobe as described on recent chest CT.  A small left pleural effusion is present, and is new. 2.  Two low-density lesions in the liver are suspicious for hepatic metastases.  These could be further evaluated with PET CT if desired. 3.  2.0 cm hypodense lesion in the medial spleen.  A splenic metastasis cannot be excluded.  Consider evaluation with PET CT. 4.  Probable herniation of a small portion of the anterior urinary bladder into the right inguinal hernia. 5.  Cardiomegaly and coronary artery atherosclerosis.   Original  Report Authenticated By: Britta Mccreedy, M.D.   Dg Chest Port 1 View  04/06/2013   *RADIOLOGY REPORT*  Clinical Data: Shortness of breath after transfusion.  PORTABLE CHEST - 1 VIEW  Comparison: 04/03/2013 and CT from 04/04/2013  Findings: Portable upright view of the chest demonstrates densities in the left lower chest.  There may also be left pleural fluid. Upper lungs remain clear. Heart size is grossly stable.  Again note is fullness in the left hilar region related to the known opacity or lesion.  IMPRESSION: Densities in the left mid and lower chest.  Findings are similar to the recent CT but there has been progression since 04/03/2013. Findings could represent postobstructive pneumonitis and/or volume loss.  There may be pleural fluid.   Original Report Authenticated By: Richarda Overlie, M.D.    Scheduled Meds: . amLODipine  10 mg Oral Daily  . feeding supplement  237 mL Oral BID BM  . ferrous sulfate  325 mg Oral TID WC  . levofloxacin  750 mg Oral Q24H  . multivitamin with minerals  1 tablet Oral Daily  . nicotine  14 mg Transdermal Daily  . sodium chloride  3 mL Intravenous Q12H  . sodium chloride  3 mL Intravenous Q12H  . vancomycin  1,250 mg Intravenous Q12H   Continuous Infusions: . sodium chloride 20 mL/hr at 04/11/13 4098    Principal Problem:   Postobstructive pneumonia Active Problems:   Hypertension   Tobacco abuse   COPD (chronic obstructive pulmonary disease)   Anemia, iron deficiency   Leukocytosis    Time spent: 40 minutes   [facility]  [facility] Pager [phone]. If 8PM-8AM, please contact night-coverage at [email or web address], password [facility] 04/12/2013, 8:32 AM  LOS: 7 days

## 2013-04-13 DIAGNOSIS — J189 Pneumonia, unspecified organism: Secondary | ICD-10-CM

## 2013-04-13 DIAGNOSIS — D72829 Elevated white blood cell count, unspecified: Secondary | ICD-10-CM

## 2013-04-13 DIAGNOSIS — R222 Localized swelling, mass and lump, trunk: Secondary | ICD-10-CM

## 2013-04-13 DIAGNOSIS — F17200 Nicotine dependence, unspecified, uncomplicated: Secondary | ICD-10-CM

## 2013-04-13 DIAGNOSIS — D509 Iron deficiency anemia, unspecified: Secondary | ICD-10-CM

## 2013-04-13 LAB — BASIC METABOLIC PANEL
Chloride: 100 mEq/L (ref 96–112)
GFR calc Af Amer: >90 mL/min (ref >90)
GFR calc non Af Amer: 81 mL/min — ABNORMAL LOW (ref >90)
Glucose, Bld: 101 mg/dL — ABNORMAL HIGH (ref 70–99)
Potassium: 3.9 mEq/L (ref 3.5–5.1)
Sodium: 135 mEq/L (ref 135–145)

## 2013-04-13 LAB — CBC
Hemoglobin: 8.8 g/dL — ABNORMAL LOW (ref 13.0–17.0)
MCHC: 28.9 g/dL — ABNORMAL LOW (ref 30.0–36.0)
WBC: 26.8 10*3/uL — ABNORMAL HIGH (ref 4.0–10.5)

## 2013-04-13 NOTE — Progress Notes (Signed)
[facility]  PROGRESS NOTE  Cameron Lam. ZOX:[id number] DOB: 1934-08-13 DOA: 04/05/2013  PCP: No PCP Per Patient  Assessment/Plan:  Principal Problem:  Postobstructive pneumonia  Active Problems:  Hypertension  Tobacco abuse  COPD (chronic obstructive pulmonary disease)  Anemia, iron deficiency  Leukocytosis   Brief narrative  79 year old male with history of hypertension, active smoker with very recently diagnosed left total mass with possible right-sided metastases in liver involvement as well presented to the ED with weakness and shortness of breath and activity was found to have a low hemoglobin chest x-ray suggestive of postobstructive pneumonia and admitted to medical floor.   Assessment/Plan:  Iron Deficiency Anemia  Low ferritin, low iron and hgb 6.2 pre transfusion of 2 units PRBCs, 8.5 and stable following transfusions. Pt is FOB negative  Stool for occult blood negative on 6/14. IV iron ordered by oncology. H&H low but stable.  -Seen by GI consult. No w/up recommended . No benefit of EGD/colonoscopy because of metastatic cancer  .  Newly diagnosed lung mass with possible mets to bilateral lung and liver  -risk factor includes underlying heavy smoking.  Seen on CT chest one week back. Appreciate oncology eval. CT abd and pelvis done showing mets to liver and spleen.  pulm consult appreciated bronchoscopy with bx done. Bronchoscopy shows endobronchial tumor at LLL bronchus which was friable. Likely non-small cell lung cancer. Follow up bx. Will need PET scan as outpatient.  -plan to start palliative radiation from next week by Dr. Roselind Messier .    Post obstructive pneumonia  Empirically treated with IV vancomycin and Levaquin. Has increased leucocytosis for unclear reasons. Remains afebrile. No diarrhea. Repeat chest x-ray tomorrow  Hypertension  Discontinued HCTZ and increase Norvasc to 10 mg a day, because of hyponatremia /dehydration     Tobacco abuse  counseled  on smoking cessation. ordered nicotine patch    Code Status: full  Family Communication: daughter at bedside  Disposition Plan: home Once stable ( as early as tomorrow if leucocytosis improving). wil follow up with Dr Darrold Span after discharge.    Consultants:  Dr Dorie Rank (oncology) Procedures:  None Antibiotics:  IV vancomycin and Levaquin (day4 )  Subjective:  Patient seen and examined this am. No overnight issues   Objective:  Filed Vitals:    04/11/13 0500  04/11/13 1538  04/11/13 2139  04/12/13 0526   BP:   134/70  136/73  126/58   Pulse:   78  72  90   Temp:   98.7 F (37.1 C)  98.5 F (36.9 C)  98.5 F (36.9 C)   TempSrc:   Oral  Oral  Oral   Resp:   20  20  18    Height:       Weight:  66.5 kg (146 lb 9.7 oz)      SpO2:   96%  96%  94%     Intake/Output Summary (Last 24 hours) at 04/12/13 0832 Last data filed at 04/12/13 0815   Gross per 24 hour   Intake  1110 ml   Output  2152 ml   Net  -1042 ml     Exam:  HENT:  Head: Atraumatic.  Nose: Nose normal.  Mouth/Throat: Oropharynx is clear and moist.  Eyes: Conjunctivae are normal. Pupils are equal, round, and reactive to light. No scleral icterus.  Neck: Neck supple. No tracheal deviation present.  Cardiovascular: Normal rate, regular rhythm, normal heart sounds and intact distal pulses.  Pulmonary/Chest: Effort normal and  breath sounds normal. No respiratory distress.  Abdominal: Soft. Normal appearance and bowel sounds are normal. She exhibits no distension. There is no tenderness.  Musculoskeletal: She exhibits no edema and no tenderness.  Neurological: She is alert. No cranial nerve deficit.   Data Reviewed:  Basic Metabolic Panel:   Recent Labs  Lab  04/05/13 1550  04/06/13 0519  04/09/13 0530   NA  133*  131*  134*   K  4.7  3.6  3.7   CL  99  99  101   CO2  25  22  26    GLUCOSE  97  112*  94   BUN  16  13  11    CREATININE  0.79  0.83  0.85   CALCIUM  8.7  8.3*  8.7    Liver Function  Tests:  No results found for this basename: AST, ALT, ALKPHOS, BILITOT, PROT, ALBUMIN, in the last 168 hours  No results found for this basename: LIPASE, AMYLASE, in the last 168 hours  No results found for this basename: AMMONIA, in the last 168 hours  CBC:   Recent Labs  Lab  04/06/13 1154  04/07/13 0448  04/08/13 0535  04/09/13 0530  04/10/13 0605  04/11/13 0528   WBC  25.5*  27.7*  26.6*  22.3*  18.8*  26.2*   NEUTROABS  12.5*  --  --  --  --  --   HGB  8.5*  8.6*  8.5*  8.5*  8.4*  8.8*   HCT  29.0*  29.2*  29.3*  30.2*  28.7*  30.7*   MCV  63.3*  64.0*  64.7*  65.2*  64.9*  65.6*   PLT  385  PLATELET CLUMPS NOTED ON SMEAR, COUNT APPEARS ADEQUATE  395  410*  417*  446*    Cardiac Enzymes:   Recent Labs  Lab  04/05/13 1550   TROPONINI  <0.30    BNP (last 3 results)  No results found for this basename: PROBNP, in the last 8760 hours  CBG:  No results found for this basename: GLUCAP, in the last 168 hours  Recent Results (from the past 240 hour(s))   URINE CULTURE Status: None    Collection Time    04/05/13 6:55 PM   Result  Value  Range  Status    Specimen Description  URINE, CLEAN CATCH   Final    Special Requests  NONE   Final    Culture Setup Time  04/06/2013 03:35   Final    Colony Count  55,000 COLONIES/ML   Final    Culture    Final    Value:  Multiple bacterial morphotypes present, none predominant. Suggest appropriate recollection if clinically indicated.    Report Status  04/07/2013 FINAL   Final   CULTURE, BLOOD (ROUTINE X 2) Status: None    Collection Time    04/05/13 6:58 PM   Result  Value  Range  Status    Specimen Description  BLOOD LEFT HAND 7 ML IN AEROBIC ONLY   Final    Special Requests  NONE   Final    Culture Setup Time  04/06/2013 02:21   Final    Culture    Final    Value:  BLOOD CULTURE RECEIVED NO GROWTH TO DATE CULTURE WILL BE HELD FOR 5 DAYS BEFORE ISSUING A FINAL NEGATIVE REPORT    Report Status  PENDING   Incomplete   CULTURE,  BLOOD (ROUTINE X 2)  Status: None    Collection Time    04/05/13 7:02 PM   Result  Value  Range  Status    Specimen Description  BLOOD RIGHT ARM 2 ML IN AEROBIC ONLY   Final    Special Requests  NONE   Final    Culture Setup Time  04/06/2013 02:21   Final    Culture    Final    Value:  BLOOD CULTURE RECEIVED NO GROWTH TO DATE CULTURE WILL BE HELD FOR 5 DAYS BEFORE ISSUING A FINAL NEGATIVE REPORT    Report Status  PENDING   Incomplete    Studies:  Dg Chest 2 View  04/11/2013 *RADIOLOGY REPORT* Clinical Data: Bronchoscopy, 04/10/2013. Hypertension. Known left lower lobe mass and consolidation. CHEST - 2 VIEW Comparison: 04/06/2013 Findings: Mass-like fullness projects along the inferior left hilum surrounded by hazy airspace opacity that is posterior on the lateral view consistent with the left lower lobe consolidation and mass noted on the recent CT. There is a small left pleural effusion. The lungs are hyperexpanded reflecting COPD. The lungs are otherwise clear. The cardiac silhouette is normal in size. No mediastinal or right hilar masses evident. IMPRESSION: No acute findings. No evidence of a complication following bronchoscopy. Left lower lobe central mass and associated lung consolidation and small effusion is similar to the prior study. Underlying COPD. Original Report Authenticated By: Amie Portland, M.D.  Dg Chest 2 View  04/03/2013 *RADIOLOGY REPORT* Clinical Data: Shortness of breath, history hypertension, smoking CHEST - 2 VIEW Comparison: None Findings: Upper normal heart size. Abnormal soft tissue density at the left pulmonary hilum. While a portion of this represents the left pulmonary artery, suspect retro hilar mass extending into the left lower lobe. Underlying emphysematous and bronchitic changes. Bibasilar atelectasis versus scarring. Atelectasis versus consolidation at medial left lung base. Upper lungs clear. No definite pleural effusion or pneumothorax. IMPRESSION: COPD with suspect  left retro hilar mass, cannot exclude tumor and hilar adenopathy. Additional atelectasis versus consolidation in left lower lobe. Further evaluation by computed tomography with IV contrast recommended. These results will be called to the ordering clinician or representative by the Radiologist Assistant, and communication documented in the PACS Dashboard. Original Report Authenticated By: Ulyses Southward, M.D.  Ct Chest W Contrast  04/04/2013 *RADIOLOGY REPORT* Clinical Data: Palpitations and dyspnea CT CHEST WITH CONTRAST Technique: Multidetector CT imaging of the chest was performed following the standard protocol during bolus administration of intravenous contrast. Contrast: 80mL OMNIPAQUE IOHEXOL 300 MG/ML SOLN Comparison: None. Findings: There is no pleural effusion identified. Severe changes of COPD/emphysema identified. There is a large central obstructing mass occluding the right lower lobe airway. This measures approximately 5.6 x 6.7 x 6.0 cm. Central areas of low attenuation are noted suggestive of necrosis. Endobronchial extension and occlusion of this tumor is noted, image 34/series 3. There is postobstructive consolidation of the much of the left lower lobe. Spiculated right upper lobe perihilar lesion measures 1.9 cm. There is a right upper lobe spiculated lesion measuring 1.1 cm, image 31/series 3. Normal heart size. No pericardial effusion. The subcarinal lymph node is enlarged measuring 2.5 cm, image 33/series 2. Limited imaging through the upper abdomen shows an indeterminant low attenuation lesion in the left hepatic lobe measuring 1.9 cm, image 60/series 2. Within the right hepatic lobe there is an indeterminate lesion measuring 2.3 cm, image 56/series 2. Review of the visualized bony structures shows no aggressive lytic or sclerotic bone lesions. IMPRESSION: 1. Central obstructing mass within the left  lower lobe is present. There are several, suspicious spiculated lesions within the right lung which  are concerning for metastases. If this is a non-small cell lung cancer then this would be considered at least a [id number]. 2. Indeterminant lesions within the liver. Consider further evaluation with PET CT. Original Report Authenticated By: Signa Kell, M.D.  Ct Abdomen Pelvis W Contrast  04/09/2013 *RADIOLOGY REPORT* Clinical Data: Possible lung cancer. Liver lesions seen on recent chest CT 04/04/2013 CT ABDOMEN AND PELVIS WITH CONTRAST Technique: Multidetector CT imaging of the abdomen and pelvis was performed following the standard protocol during bolus administration of intravenous contrast. Contrast: OMNIPAQUE IOHEXOL 300 MG/ML SOLN, 50mL OMNIPAQUE IOHEXOL 300 MG/ML SOLN Comparison: Chest CT 04/04/2013 Findings: Lung bases: Again seen is a dense consolidation throughout the visualized portion of the left lower lobe. This is likely due to a central obstructing tumor as described on recent chest CT. There is now a small left pleural effusion that appears new compared to recent CT. In the there is new patchy airspace disease in the periphery of the right lower lobe near the right hemidiaphragm. In the no definite pleural effusion on the right. Mild cardiomegaly with coronary artery atherosclerotic calcifications. In the left lobe of the liver is approximately 2 cm irregularly shaped lesion with Hounsfield units of approximately 26. This appears similar to prior chest CT. In the posterior right lobe of the liver is a 2.4 cm hypodense lesion with Hounsfield units of 32. Hepatic metastases cannot be excluded. PET CT could be performed for further characterization. In the medial spleen is a 2.0 cm hypodense lesion that measures 39 HU. This is seen to better advantage on today's CT given differences in timing relative to contrast administration. A splenic metastasis cannot be excluded. There are no prior images the earlier than June 2014 of the abdomen for comparison over time. The spleen is normal in size.  There is a paucity of intra-abdominal fat, which limits the evaluation for acute inflammatory changes. The gallbladder is decompressed and unremarkable. There is slight thickening of the limbs of the left adrenal gland, but no discrete adrenal nodule or mass is seen. The right adrenal gland appears normal. The pancreas and kidneys are within normal limits. Bowel loops are normal in caliber. The urinary bladder is moderately distended and the anterior bladder deviates toward the right of midline. On image #74 of the axial images, there is a fluid-filled structure with peripheral enhancement in the right inguinal canal that is favored to be herniation of a small portion of the urinary bladder into the inguinal hernia. All of the small bowel loops in the abdomen and pelvis are well opacified with oral contrast. No herniated bowel loops are seen. The abdominal aorta is normal in caliber contains atherosclerotic calcification. The prostate gland is enlarged and bulges upward into the bladder base. No abdominal or pelvic lymphadenopathy is identified. The imaged vertebral bodies are normal in height and alignment. No suspicious osseous lesions are identified. Negative for fracture. IMPRESSION: 1. Dense consolidation in the left lower lobe of the lung is likely due to postobstructive pneumonitis in this patient finding with findings suspicious for a central obstructing malignancy in the left lower lobe as described on recent chest CT. A small left pleural effusion is present, and is new. 2. Two low-density lesions in the liver are suspicious for hepatic metastases. These could be further evaluated with PET CT if desired. 3. 2.0 cm hypodense lesion in the medial spleen. A splenic metastasis cannot  be excluded. Consider evaluation with PET CT. 4. Probable herniation of a small portion of the anterior urinary bladder into the right inguinal hernia. 5. Cardiomegaly and coronary artery atherosclerosis. Original Report  Authenticated By: Britta Mccreedy, M.D.  Dg Chest Port 1 View  04/06/2013 *RADIOLOGY REPORT* Clinical Data: Shortness of breath after transfusion. PORTABLE CHEST - 1 VIEW Comparison: 04/03/2013 and CT from 04/04/2013 Findings: Portable upright view of the chest demonstrates densities in the left lower chest. There may also be left pleural fluid. Upper lungs remain clear. Heart size is grossly stable. Again note is fullness in the left hilar region related to the known opacity or lesion. IMPRESSION: Densities in the left mid and lower chest. Findings are similar to the recent CT but there has been progression since 04/03/2013. Findings could represent postobstructive pneumonitis and/or volume loss. There may be pleural fluid. Original Report Authenticated By: Richarda Overlie, M.D.  Scheduled Meds:  .  amLODipine  10 mg  Oral  Daily   .  feeding supplement  237 mL  Oral  BID BM   .  ferrous sulfate  325 mg  Oral  TID WC   .  levofloxacin  750 mg  Oral  Q24H   .  multivitamin with minerals  1 tablet  Oral  Daily   .  nicotine  14 mg  Transdermal  Daily   .  sodium chloride  3 mL  Intravenous  Q12H   .  sodium chloride  3 mL  Intravenous  Q12H   .  vancomycin  1,250 mg  Intravenous  Q12H    Continuous Infusions:  .  sodium chloride  20 mL/hr at 04/11/13 0454    Principal Problem:  Postobstructive pneumonia  Active Problems:  Hypertension  Tobacco abuse  COPD (chronic obstructive pulmonary disease)  Anemia, iron deficiency  Leukocytosis   Time spent: 40 minutes  [facility]  [facility]  Pager [phone]. If 8PM-8AM, please contact night-coverage at [email or web address], password [facility]  04/12/2013, 8:32 AM LOS: 7 days

## 2013-04-13 NOTE — Evaluation (Signed)
Physical Therapy One Time Evaluation Patient Details Name: Cameron Lam. MRN: [id number] DOB: [birth year] Today's Date: 04/13/2013 Time: 4132-4401 PT Time Calculation (min): 9 min  PT Assessment / Plan / Recommendation Clinical Impression  Pt presents with postobstructive PNA with recent L lung mass found and believed to be some LLL collapse.  Tolerated OOB and ambulation in hallway very well at Mod I level and also performed stairs.  Pt will not require any further follow up in hospital or at home.  PT to sign off at this time.      PT Assessment  Patent does not need any further PT services    Follow Up Recommendations  No PT follow up    Does the patient have the potential to tolerate intense rehabilitation      Barriers to Discharge        Equipment Recommendations  None recommended by PT    Recommendations for Other Services     Frequency      Precautions / Restrictions Precautions Precautions: None Restrictions Weight Bearing Restrictions: No   Pertinent Vitals/Pain No pain, no SOB      Mobility  Bed Mobility Bed Mobility: Not assessed Transfers Transfers: Sit to Stand;Stand to Sit Sit to Stand: 7: Independent Stand to Sit: 7: Independent Ambulation/Gait Ambulation/Gait Assistance: 7: Independent Ambulation Distance (Feet): 500 Feet Assistive device: None Gait Pattern: Within Functional Limits Stairs: Yes Stairs Assistance: 6: Modified independent (Device/Increase time) Stair Management Technique: One rail Right;Alternating pattern Number of Stairs: 10    Exercises     PT Diagnosis:    PT Problem List:   PT Treatment Interventions:     PT Goals    Visit Information  Last PT Received On: 04/13/13 Assistance Needed: +1    Subjective Data  Subjective: Oh, I'm doing fine.  Patient Stated Goal: to return home tomorrow.    Prior Functioning  Home Living Lives With: Spouse;Daughter Available Help at Discharge: Family;Available 24  hours/day Type of Home: House Home Layout: Two level;Bed/bath upstairs Alternate Level Stairs-Rails: Right Bathroom Shower/Tub: Engineer, manufacturing systems: Standard Home Adaptive Equipment: None Prior Function Level of Independence: Independent Able to Take Stairs?: Yes Driving: Yes Vocation: Retired Musician: No difficulties    Copywriter, advertising Arousal/Alertness: Awake/alert Behavior During Therapy: WFL for tasks assessed/performed Overall Cognitive Status: Within Functional Limits for tasks assessed    Extremity/Trunk Assessment Right Lower Extremity Assessment RLE ROM/Strength/Tone: Within functional levels RLE Sensation: WFL - Light Touch RLE Coordination: WFL - gross/fine motor Left Lower Extremity Assessment LLE ROM/Strength/Tone: Within functional levels LLE Sensation: WFL - Light Touch LLE Coordination: WFL - gross/fine motor Trunk Assessment Trunk Assessment: Normal   Balance    End of Session PT - End of Session Activity Tolerance: Patient tolerated treatment well Patient left: in chair;with call bell/phone within reach;with family/visitor present Nurse Communication: Mobility status  GP     Vista Deck 04/13/2013, 2:44 PM

## 2013-04-14 ENCOUNTER — Ambulatory Visit: Admit: 2013-04-14 | Payer: Medicare Other | Admitting: Radiation Oncology

## 2013-04-14 ENCOUNTER — Telehealth: Payer: Self-pay | Admitting: *Deleted

## 2013-04-14 ENCOUNTER — Inpatient Hospital Stay (HOSPITAL_COMMUNITY): Payer: Medicare Other

## 2013-04-14 DIAGNOSIS — E46 Unspecified protein-calorie malnutrition: Secondary | ICD-10-CM | POA: Diagnosis present

## 2013-04-14 DIAGNOSIS — C3490 Malignant neoplasm of unspecified part of unspecified bronchus or lung: Secondary | ICD-10-CM | POA: Diagnosis present

## 2013-04-14 LAB — CBC
HCT: 30.3 % — ABNORMAL LOW (ref 39.0–52.0)
Hemoglobin: 8.6 g/dL — ABNORMAL LOW (ref 13.0–17.0)
MCH: 19.5 pg — ABNORMAL LOW (ref 26.0–34.0)
RBC: 4.42 MIL/uL (ref 4.22–5.81)

## 2013-04-14 MED ORDER — AMLODIPINE BESYLATE 5 MG PO TABS: 10.0000 mg | ORAL_TABLET | Freq: Every day | ORAL | Status: DC

## 2013-04-14 MED ORDER — FERROUS SULFATE 325 (65 FE) MG PO TABS: 325.0000 mg | ORAL_TABLET | Freq: Three times a day (TID) | ORAL | Status: DC

## 2013-04-14 MED ORDER — IOHEXOL 300 MG/ML  SOLN
100.0000 mL | Freq: Once | INTRAMUSCULAR | Status: AC | PRN
  Administered 2013-04-14: 100 mL via INTRAVENOUS

## 2013-04-14 MED ORDER — LEVOFLOXACIN 750 MG PO TABS: 750.0000 mg | ORAL_TABLET | Freq: Every day | ORAL | Status: DC

## 2013-04-14 MED ORDER — ENSURE COMPLETE PO LIQD: 237.0000 mL | Freq: Two times a day (BID) | ORAL | Status: AC

## 2013-04-14 MED ORDER — NICOTINE 14 MG/24HR TD PT24: 1.0000 | MEDICATED_PATCH | Freq: Every day | TRANSDERMAL | Status: DC

## 2013-04-14 NOTE — Progress Notes (Signed)
NUTRITION FOLLOW UP  Intervention:   Continue Ensure Complete BID Continue Multivitamin with minerals Recommend continuation of Ensure Complete 1-2 times daily after discharge   Nutrition Dx:   Inadequate oral intake related to hospitalization as evidenced by pt's report of eating < 50% of meals; discontinued, pt eating 75-100% of most meals  Goal:   Pt to meet >/= 90% of their estimated nutrition needs; being met   Monitor:   PO intake; 75-100% most meals per nursing notes Weight; stable from 6/14 to 6/20 Labs; low hemoglobin  Assessment:   Pt asleep at time of visit. Per multidisciplinary rounds pt may be going home today. Per nursing notes pt is consuming 75-100% of most meals. Weight has been stable from 6/14 to 6/20.   Height: Ht Readings from Last 1 Encounters:  04/05/13 5\' 7"  (1.702 m)    Weight Status:   Wt Readings from Last 1 Encounters:  04/11/13 146 lb 9.7 oz (66.5 kg)    Re-estimated needs:  Kcal: 1570-1830  Protein: 99-106 grams  Fluid: 2 L  Skin: intact  Diet Order: General   Intake/Output Summary (Last 24 hours) at 04/14/13 1506 Last data filed at 04/14/13 1352  Gross per 24 hour  Intake   2420 ml  Output    550 ml  Net   1870 ml    Last BM: 6/22  Labs:   Recent Labs Lab 04/09/13 0530 04/12/13 2008 04/13/13 0530  NA 134*  --  135  K 3.7  --  3.9  CL 101  --  100  CO2 26  --  27  BUN 11  --  11  CREATININE 0.85 0.89 0.88  CALCIUM 8.7  --  8.9  GLUCOSE 94  --  101*    CBG (last 3)  No results found for this basename: GLUCAP,  in the last 72 hours  Scheduled Meds: . amLODipine  10 mg Oral Daily  . feeding supplement  237 mL Oral BID BM  . ferrous sulfate  325 mg Oral TID WC  . levofloxacin  750 mg Oral Q24H  . multivitamin with minerals  1 tablet Oral Daily  . nicotine  14 mg Transdermal Daily  . sodium chloride  3 mL Intravenous Q12H  . sodium chloride  3 mL Intravenous Q12H  . vancomycin  1,250 mg Intravenous Q12H     Continuous Infusions: . sodium chloride 20 mL/hr at 04/11/13 0654    Ian Malkin RD, LDN Inpatient Clinical Dietitian Pager: [phone] After Hours Pager: [phone]

## 2013-04-14 NOTE — Telephone Encounter (Signed)
Spoke with wife regarding appt with Dr. Arbutus Ped 04/28/13 at 3:30 labs/FC and Dr. Arbutus Ped at 4:00.  She verbalized understanding of time and place of appt

## 2013-04-14 NOTE — Progress Notes (Signed)
  Radiation Oncology         [phone]) [phone] ________________________________  Name: Cameron Lam. MRN: [id number]  Date: 04/11/2013  DOB: 15-Mar-1934  SIMULATION AND TREATMENT PLANNING NOTE  DIAGNOSIS:  Probable stage IV non-small cell lung  NARRATIVE:  The patient was brought to the CT Simulation planning suite.  Identity was confirmed.  All relevant records and images related to the planned course of therapy were reviewed.  The patient freely provided informed written consent to proceed with treatment after reviewing the details related to the planned course of therapy. The consent form was witnessed and verified by the simulation staff.  Then, the patient was set-up in a stable reproducible  supine position for radiation therapy.  CT images were obtained.  Surface markings were placed.  The CT images were loaded into the planning software.  Then the target and avoidance structures were contoured.  Treatment planning then occurred.  The radiation prescription was entered and confirmed.  Then, I designed and supervised the construction of a total of 1 medically necessary complex treatment devices.  I have requested : 3D Simulation  I have requested a DVH of the following structures: tumor volume, spinal cord, lungs,esophagus.  I have ordered:dose calc.  PLAN:  The patient will receive 37.5 Gy in 15 fractions.  ________________________________  -----------------------------------  Billie Lade, PhD, MD

## 2013-04-14 NOTE — Progress Notes (Signed)
Medical Oncology  EMR reviewed and I have spoken with patient now. He is feeling much better overall and hopes for DC home soon. He had Feraheme on 04-09-13, Hgb 8.6 today, GI (Dr Russella Dar) has seen and would do endoscopy if "overt or persistent occult GI bleeding" . WBC still elevated tho slightly better, no + cultures. Dr Roselind Messier plans RT upcoming. Dr Arbutus Ped will follow for medical oncology; Cancer Center to be in touch with patient about appointment with Dr Arbutus Ped. If he is not discharged today, please CT head for staging metastatic lung cancer while still in hospital (that order placed now in case it can be done this AM).  Please call if other questions.  L.[place], MD [phone]

## 2013-04-14 NOTE — Discharge Summary (Signed)
Physician Discharge Summary  Cameron Lam. XBJ:[id number] DOB: [birth year] DOA: 04/05/2013  PCP: No PCP Per Patient  Admit date: 04/05/2013 Discharge date: 04/14/2013  Time spent: 40  minutes  Recommendations for Outpatient Follow-up:  1. Home with outpt follow up at the urgent care 2. Patient will be called from Dr. Asa Lente office for followup in 1 to 2 weeks.  Discharge Diagnoses:   Principal Problem:   Postobstructive pneumonia  Active Problems:   COPD (chronic obstructive pulmonary disease)   Anemia, iron deficiency   Squamous cell carcinoma lung   Hypertension   Tobacco abuse   Leukocytosis Protein calorie malnutrition   Discharge Condition:  fair  Diet recommendation: regular  Filed Weights   04/05/13 1824 04/11/13 0500  Weight: 66.225 kg (146 lb) 66.5 kg (146 lb 9.7 oz)    History of present illness:   Please refer to admission H&P for details, but in brief , 79 year old male with history of hypertension, active smoker with very recently diagnosed left lower lung mass with possible right-sided metastases and  liver involvement presented to the ED with weakness and shortness of breath with  activity and was found to have a low hemoglobin of 5.6. chest x-ray suggestive of postobstructive pneumonia and admitted to medical floor.   Hospital Course:  Iron Deficiency Anemia  Low ferritin, low iron and hgb 6.2 on presentation. transfused with 3 U prbc,. Hb of  8.6 and stable following transfusions.  Stool for occult blood negative on 6/14. IV iron ordered by oncology. -Seen by GI consult. No w/up recommended given need for malignancy w/up and treatment at this time.  . .  Newly diagnosed lung mass with possible mets to bilateral lung and liver  -risk factor includes underlying heavy smoking.  Lung mass seen on CT chest one week back. Appreciate oncology eval. CT abd and pelvis done showing mets to liver and spleen.  pulm consult appreciated.  bronchoscopy with  bx done. Bronchoscopy shows endobronchial tumor at LLL bronchus which was friable. bx results show invasive squamous cell carcinoma..  -plan to start palliative radiation from tomorrow  by Dr. Roselind Messier .   Post obstructive pneumonia  Empirically treated with IV vancomycin and Levaquin. Has increased leucocytosis for unclear reasons, although remains afebrile. No diarrhea. Repeat chest x-ray shows developing patchy infiltrate in right mid lung as well. Will discharge on oral Levaquin to complete a 2 week course. Has been treated for 10 days while in the hospital.  Hypertension  Discontinued HCTZ due to hyponatremia.increase Norvasc to 10 mg a day.  Tobacco abuse  counseled on smoking cessation. ordered nicotine patch upon discharge.  Protein calorie malnutrition provided with nutrition supplements.  Code Status: full  Family Communication: none at bedside  Disposition Plan: home with out pt follow up     Procedures:  bronchoscopy with biopsy  Consultations:  Dr Darrold Span ( oncology)   radiation oncology   PCCM    Discharge Exam: Filed Vitals:   04/13/13 1605 04/13/13 2200 04/14/13 0525 04/14/13 1325  BP: 122/72 138/86 147/84 136/67  Pulse: 96 87 92 90  Temp: 98.3 F (36.8 C) 98.1 F (36.7 C) 98.5 F (36.9 C) 97.7 F (36.5 C)  TempSrc: Oral Oral Oral Oral  Resp: 18 20 16 18   Height:      Weight:      SpO2: 95% 94% 91% 93%    General: Elderly male in no acute distress . Temporal wasting HEENT: pallor+, moist oral mucosa  Chest: Diminished breath  sounds over left lower lobe, no rhonchi wheeze  CVS: Normal S1 and S2, no murmurs rub or gallop  Abdomen: Soft, nontender, nondistended, bowel sounds present  extremities: Warm, no edema  CNS: AAO x3   Discharge Instructions   Future Appointments Provider Department Dept Phone   04/15/2013 12:30 PM Billie Lade, MD [facility] [phone]   Joint Appt [facility] [id number] [facility] [phone]   04/16/2013 12:05 PM [facility] [id number] [facility] [phone]   04/17/2013 11:55 AM [facility] [id number] [facility] [phone]   04/18/2013 11:55 AM [facility] [id number] [facility] [phone]   04/21/2013 2:15 PM [facility] [id number] [facility] [phone]   04/22/2013 2:15 PM [facility] [id number] [facility] [phone]   04/23/2013 2:15 PM [facility] [id number] [facility] [phone]   04/24/2013 2:15 PM [facility] [id number] [facility] [phone]   04/28/2013 9:30 AM [facility] [id number] [facility] [phone]   04/28/2013 3:30 PM [facility] Financial Counselor [facility] [phone]   04/28/2013 3:45 PM Mauri [facility] [phone]   04/28/2013 4:00 PM Si Gaul, MD [facility] [phone]   04/29/2013 9:30 AM [facility] [id number] [facility] [phone]   04/30/2013 9:30 AM [facility] [id number] [facility] [phone]   05/01/2013 9:30 AM [facility] [id number] [facility] [phone]   05/02/2013 9:45 AM [facility] [id number] [facility] [phone]   05/05/2013 9:30 AM [facility] [id number] [facility] [phone]   05/06/2013 9:30 AM [facility] [id number]  [facility] [phone]   05/09/2013 2:30 PM Coralyn Helling, MD [facility] [phone]       Medication List    STOP taking these medications       hydrochlorothiazide 12.5 MG capsule  Commonly  known as:  MICROZIDE      TAKE these medications       albuterol 108 (90 BASE) MCG/ACT inhaler  Commonly known as:  PROVENTIL HFA;VENTOLIN HFA  Inhale 2 puffs into the lungs every 6 (six) hours as needed for wheezing.     amLODipine 5 MG tablet  Commonly known as:  NORVASC  Take 2 tablets (10 mg total) by mouth daily.     aspirin 81 MG tablet  Take 1 tablet (81 mg total) by mouth daily.     feeding supplement Liqd  Take 237 mLs by mouth 2 (two) times daily between meals.     ferrous sulfate 325 (65 FE) MG tablet  Take 1 tablet (325 mg total) by mouth 3 (three) times daily with meals.     nicotine 14 mg/24hr patch  Commonly known as:  NICODERM CQ - dosed in mg/24 hours  Place 1 patch onto the skin daily.      Tab levofloxacin 750 mg daily for 4 days ( until 6/27)  No Known Allergies     Follow-up Information   Follow up with Lajuana Matte., MD. (Will be called from the office)    Contact information:   [address] [place] [address] [phone]        The results of significant diagnostics from this hospitalization (including imaging, microbiology, ancillary and laboratory) are listed below  for reference.    Significant Diagnostic Studies: Dg Chest 2 View  04/14/2013   *RADIOLOGY REPORT*  Clinical Data: Follow up pneumonia  CHEST - 2 VIEW  Comparison: Prior chest x-ray 04/11/2013  Findings: Persistent left lower lobe atelectasis.  The central obstructing mass is better demonstrated on recent prior CT scan. Developing patchy opacities in the right lower lobe.  The lungs remain hyperexpanded with bronchitic and emphysematous changes. Stable cardiomegaly. No acute osseous abnormality.  IMPRESSION:  1.  Developing patchy opacities in the right mid lung could reflect pneumonia in the appropriate clinical setting. 2.  Stable appearance of known left central obstructing mass and left lower lobe atelectasis. 3.  COPD and emphysema   Original Report Authenticated By:  Malachy Moan, M.D.   Dg Chest 2 View  04/11/2013   *RADIOLOGY REPORT*  Clinical Data: Bronchoscopy, 04/10/2013.  Hypertension.  Known left lower lobe mass and consolidation.  CHEST - 2 VIEW  Comparison: 04/06/2013  Findings: Mass-like fullness projects along the inferior left hilum surrounded by hazy airspace opacity that is posterior on the lateral view consistent with the left lower lobe consolidation and mass noted on the recent CT.  There is a small left pleural effusion.  The lungs are hyperexpanded reflecting COPD.  The lungs are otherwise clear.  The cardiac silhouette is normal in size.  No mediastinal or right hilar masses evident.  IMPRESSION: No acute findings.  No evidence of a complication following bronchoscopy.  Left lower lobe central mass and associated lung consolidation and small effusion is similar to the prior study. Underlying COPD.   Original Report Authenticated By: Amie Portland, M.D.   Dg Chest 2 View  04/03/2013   *RADIOLOGY REPORT*  Clinical Data: Shortness of breath, history hypertension, smoking  CHEST - 2 VIEW  Comparison: None  Findings: Upper normal heart size. Abnormal soft tissue density at the left pulmonary hilum. While a portion of this represents the left pulmonary artery, suspect retro hilar mass extending into the left lower lobe. Underlying emphysematous and bronchitic changes. Bibasilar atelectasis versus scarring. Atelectasis versus consolidation at medial left lung base. Upper lungs clear. No definite pleural effusion or pneumothorax.  IMPRESSION: COPD with suspect left retro hilar mass, cannot exclude tumor and hilar adenopathy. Additional atelectasis versus consolidation in left lower lobe. Further evaluation by computed tomography with IV contrast recommended.  These results will be called to the ordering clinician or representative by the Radiologist Assistant, and communication documented in the PACS Dashboard.   Original Report Authenticated By: Ulyses Southward,  M.D.   Ct Head W Contrast  04/14/2013   *RADIOLOGY REPORT*  Clinical Data: Lung cancer staging  CT HEAD WITH CONTRAST  Technique:  Contiguous axial images were obtained from the base of the skull through the vertex with intravenous contrast  Contrast: OMNIPAQUE IOHEXOL 300 MG/ML  SOLN  Comparison: None.  Findings: Diffuse brain atrophy noted with microvascular ischemic changes in the periventricular and subcortical white matter. 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 Cerebellar atrophy as well.  No acute intracranial hemorrhage, definite acute infarction, mass lesion, mass effect, focal edema, midline shift, herniation, hydrocephalus, or extra-axial fluid collection.  Gray-white matter differentiation maintained. Cisterns are patent.  Postcontrast, there is normal vascular enhancement.  No abnormal brain parenchymal enhancement to suggest metastatic disease. Mastoids clear.  Minor sinus mucosal thickening.  Postop changes of the maxillary sinuses.  No skull abnormality.  Symmetric orbits.  IMPRESSION: Atrophy and microvascular chronic ischemic changes.  Remote infarcts.  No acute process or evidence of intracranial metastatic disease.   Original Report Authenticated By: Judie Petit. Miles Costain, M.D.   Ct Chest W Contrast  04/04/2013   *RADIOLOGY REPORT*  Clinical Data: Palpitations and dyspnea  CT CHEST WITH CONTRAST  Technique:  Multidetector CT imaging of the chest was performed following the standard protocol during bolus administration of intravenous contrast.  Contrast: 80mL OMNIPAQUE IOHEXOL 300 MG/ML  SOLN  Comparison: None.  Findings: There is no pleural effusion identified.  Severe changes of  COPD/emphysema identified.  There is a large central obstructing mass occluding the right lower lobe airway.  This measures approximately 5.6 x 6.7 x 6.0 cm.  Central areas of low attenuation are noted suggestive of necrosis.   Endobronchial extension and occlusion of this tumor is noted,  image 34/series 3. There is postobstructive consolidation of the much of the left lower lobe.  Spiculated right upper lobe perihilar lesion measures 1.9 cm. There is a right upper lobe spiculated lesion measuring 1.1 cm, image 31/series 3.  Normal heart size.  No pericardial effusion.  The subcarinal lymph node is enlarged measuring 2.5 cm, image 33/series 2.  Limited imaging through the upper abdomen shows an indeterminant low attenuation lesion in the left hepatic lobe measuring 1.9 cm, image 60/series 2.  Within the right hepatic lobe there is an indeterminate lesion measuring 2.3 cm, image 56/series 2.  Review of the visualized bony structures shows no aggressive lytic or sclerotic bone lesions.  IMPRESSION:  1.  Central obstructing mass within the left lower lobe is present. There are several, suspicious spiculated lesions within the right lung which are concerning for metastases.  If this is a non-small cell lung cancer then this would be considered at least a T2bN2M1a. 2.  Indeterminant lesions within the liver.  Consider further evaluation with PET CT.   Original Report Authenticated By: Signa Kell, M.D.   Ct Abdomen Pelvis W Contrast  04/09/2013   *RADIOLOGY REPORT*  Clinical Data: Possible lung cancer.  Liver lesions seen on recent chest CT 04/04/2013  CT ABDOMEN AND PELVIS WITH CONTRAST  Technique:  Multidetector CT imaging of the abdomen and pelvis was performed following the standard protocol during bolus administration of intravenous contrast.  Contrast: OMNIPAQUE IOHEXOL 300 MG/ML  SOLN, 50mL OMNIPAQUE IOHEXOL 300 MG/ML  SOLN  Comparison: Chest CT 04/04/2013  Findings:  Lung bases:  Again seen is a dense consolidation throughout the visualized portion of the left lower lobe.  This is likely due to a central obstructing tumor as described on recent chest CT.  There is now a small left pleural effusion that appears new compared to recent CT.  In the there is new patchy airspace disease in  the periphery of the right lower lobe near the right hemidiaphragm.  In the no definite pleural effusion on the right.  Mild cardiomegaly with coronary artery atherosclerotic calcifications.   In the left lobe of the liver is approximately 2 cm irregularly shaped lesion with Hounsfield units of approximately 26.  This appears similar to prior chest CT.  In the posterior right lobe of the liver is a 2.4 cm hypodense lesion with Hounsfield units of 32. Hepatic metastases cannot be excluded.  PET CT could be performed for further characterization.  In the medial spleen is a 2.0 cm hypodense lesion that measures 39 HU.  This is seen to better advantage on today's CT given differences in timing relative to contrast administration. A splenic metastasis cannot be excluded.  There  are no prior images the earlier than June 2014 of the abdomen for comparison over time. The spleen is normal in size.  There is a paucity of intra-abdominal fat, which limits the evaluation for acute inflammatory changes.  The gallbladder is decompressed and unremarkable.  There is slight thickening of the limbs of the left adrenal gland, but no discrete adrenal nodule or mass is seen.  The right adrenal gland appears normal.  The pancreas and kidneys are within normal limits.  Bowel loops are normal in caliber.  The urinary bladder is moderately distended and the anterior bladder deviates toward the right of midline.  On image #74 of the axial images, there is a fluid-filled structure with peripheral enhancement in the right inguinal canal that is favored to be herniation of a small portion of the urinary bladder into the inguinal hernia.  All of the small bowel loops in the abdomen and pelvis are well opacified with oral contrast.  No herniated bowel loops are seen.  The abdominal aorta is normal in caliber contains atherosclerotic calcification.  The prostate gland is enlarged and bulges upward into the bladder base.  No abdominal or pelvic  lymphadenopathy is identified.  The imaged vertebral bodies are normal in height and alignment.  No suspicious osseous lesions are identified.  Negative for fracture.  IMPRESSION:  1.  Dense consolidation in the left lower lobe of the lung is likely due to postobstructive pneumonitis in this patient finding with findings suspicious for a central obstructing malignancy in the left lower lobe as described on recent chest CT.  A small left pleural effusion is present, and is new. 2.  Two low-density lesions in the liver are suspicious for hepatic metastases.  These could be further evaluated with PET CT if desired. 3.  2.0 cm hypodense lesion in the medial spleen.  A splenic metastasis cannot be excluded.  Consider evaluation with PET CT. 4.  Probable herniation of a small portion of the anterior urinary bladder into the right inguinal hernia. 5.  Cardiomegaly and coronary artery atherosclerosis.   Original Report Authenticated By: Britta Mccreedy, M.D.   Dg Chest Port 1 View  04/06/2013   *RADIOLOGY REPORT*  Clinical Data: Shortness of breath after transfusion.  PORTABLE CHEST - 1 VIEW  Comparison: 04/03/2013 and CT from 04/04/2013  Findings: Portable upright view of the chest demonstrates densities in the left lower chest.  There may also be left pleural fluid. Upper lungs remain clear. Heart size is grossly stable.  Again note is fullness in the left hilar region related to the known opacity or lesion.  IMPRESSION: Densities in the left mid and lower chest.  Findings are similar to the recent CT but there has been progression since 04/03/2013. Findings could represent postobstructive pneumonitis and/or volume loss.  There may be pleural fluid.   Original Report Authenticated By: Richarda Overlie, M.D.    Microbiology: Recent Results (from the past 240 hour(s))  URINE CULTURE     Status: None   Collection Time    04/05/13  6:55 PM      Result Value Range Status   Specimen Description URINE, CLEAN CATCH   Final    Special Requests NONE   Final   Culture  Setup Time 04/06/2013 03:35   Final   Colony Count 55,000 COLONIES/ML   Final   Culture     Final   Value: Multiple bacterial morphotypes present, none predominant. Suggest appropriate recollection if clinically indicated.   Report Status 04/07/2013  FINAL   Final  CULTURE, BLOOD (ROUTINE X 2)     Status: None   Collection Time    04/05/13  6:58 PM      Result Value Range Status   Specimen Description BLOOD LEFT HAND  7 ML IN AEROBIC ONLY   Final   Special Requests NONE   Final   Culture  Setup Time 04/06/2013 02:21   Final   Culture NO GROWTH 5 DAYS   Final   Report Status 04/12/2013 FINAL   Final  CULTURE, BLOOD (ROUTINE X 2)     Status: None   Collection Time    04/05/13  7:02 PM      Result Value Range Status   Specimen Description BLOOD RIGHT ARM  2 ML IN AEROBIC ONLY   Final   Special Requests NONE   Final   Culture  Setup Time 04/06/2013 02:21   Final   Culture NO GROWTH 5 DAYS   Final   Report Status 04/12/2013 FINAL   Final     Labs: Basic Metabolic Panel:  Recent Labs Lab 04/09/13 0530 04/12/13 2008 04/13/13 0530  NA 134*  --  135  K 3.7  --  3.9  CL 101  --  100  CO2 26  --  27  GLUCOSE 94  --  101*  BUN 11  --  11  CREATININE 0.85 0.89 0.88  CALCIUM 8.7  --  8.9   Liver Function Tests: No results found for this basename: AST, ALT, ALKPHOS, BILITOT, PROT, ALBUMIN,  in the last 168 hours No results found for this basename: LIPASE, AMYLASE,  in the last 168 hours No results found for this basename: AMMONIA,  in the last 168 hours CBC:  Recent Labs Lab 04/09/13 0530 04/10/13 0605 04/11/13 0528 04/13/13 0530 04/14/13 0436  WBC 22.3* 18.8* 26.2* 26.8* 22.0*  HGB 8.5* 8.4* 8.8* 8.8* 8.6*  HCT 30.2* 28.7* 30.7* 30.4* 30.3*  MCV 65.2* 64.9* 65.6* 67.1* 68.6*  PLT 410* 417* 446* 563* 527*   Cardiac Enzymes: No results found for this basename: CKTOTAL, CKMB, CKMBINDEX, TROPONINI,  in the last 168 hours BNP: BNP  (last 3 results) No results found for this basename: PROBNP,  in the last 8760 hours CBG: No results found for this basename: GLUCAP,  in the last 168 hours     Signed:  Massiah Minjares  [facility] 04/14/2013, 3:16 PM

## 2013-04-15 ENCOUNTER — Ambulatory Visit: Admit: 2013-04-15 | Payer: Medicare Other | Admitting: Radiation Oncology

## 2013-04-15 ENCOUNTER — Ambulatory Visit: Admission: RE | Admit: 2013-04-15 | Payer: Medicare Other | Source: Ambulatory Visit | Admitting: Radiation Oncology

## 2013-04-16 ENCOUNTER — Ambulatory Visit: Payer: Medicare Other | Admitting: Radiation Oncology

## 2013-04-17 ENCOUNTER — Ambulatory Visit
Admission: RE | Admit: 2013-04-17 | Discharge: 2013-04-17 | Disposition: A | Discharge status: Home / Self Care | Payer: Medicare Other | Source: Ambulatory Visit | Attending: Radiation Oncology | Admitting: Radiation Oncology

## 2013-04-17 DIAGNOSIS — C3492 Malignant neoplasm of unspecified part of left bronchus or lung: Secondary | ICD-10-CM

## 2013-04-17 NOTE — Progress Notes (Signed)
Post sim ed completed w/pt and daughter. Gave pt "Radiation and You" booklet w/all pertinent information marked and discussed,re: fatigue, skin irritation/care, throat irritation/management, nutrition, pain. Questions answered, pt and daughter verbalized understanding. Teach back method used.

## 2013-04-18 ENCOUNTER — Ambulatory Visit
Admit: 2013-04-18 | Discharge: 2013-04-18 | Disposition: A | Discharge status: Home / Self Care | Payer: Medicare Other | Attending: Radiation Oncology | Admitting: Radiation Oncology

## 2013-04-18 ENCOUNTER — Encounter: Payer: Self-pay | Admitting: *Deleted

## 2013-04-19 ENCOUNTER — Ambulatory Visit
Admission: RE | Admit: 2013-04-19 | Discharge: 2013-04-19 | Disposition: A | Discharge status: Home / Self Care | Payer: Medicare Other | Source: Ambulatory Visit | Attending: Radiation Oncology | Admitting: Radiation Oncology

## 2013-04-21 ENCOUNTER — Ambulatory Visit
Admission: RE | Admit: 2013-04-21 | Discharge: 2013-04-21 | Disposition: A | Discharge status: Home / Self Care | Payer: Medicare Other | Source: Ambulatory Visit | Attending: Radiation Oncology | Admitting: Radiation Oncology

## 2013-04-21 ENCOUNTER — Encounter: Payer: Self-pay | Admitting: Radiation Oncology

## 2013-04-21 ENCOUNTER — Ambulatory Visit
Admit: 2013-04-21 | Discharge: 2013-04-21 | Disposition: A | Discharge status: Home / Self Care | Payer: Medicare Other | Attending: Radiation Oncology | Admitting: Radiation Oncology

## 2013-04-21 DIAGNOSIS — C3492 Malignant neoplasm of unspecified part of left bronchus or lung: Secondary | ICD-10-CM

## 2013-04-21 NOTE — Progress Notes (Signed)
[facility]    Radiation Oncology [address] [place]     Maryln Gottron, M.D. [place], [place] [address]               Billie Lade, M.D., Ph.D. Phone: [phone]      Molli Hazard A. Kathrynn Running, M.D. Fax: [phone]      Radene Gunning, M.D., Ph.D.         Lurline Hare, M.D.         Grayland Jack, M.D Weekly Treatment Management Note  Name: Cameron Lam.     MRN: [id number]        CSN: [id number] Date: 04/21/2013      DOB: 1934/08/22  CC: No PCP Per Patient         No ref. provider found    Status: Outpatient  Diagnosis: The encounter diagnosis was Squamous cell carcinoma lung, left.  Current Dose: 10 Gy  Current Fraction: 4  Planned Dose: 37.5 Gy  Narrative: Cameron Lam. was seen today for weekly treatment management. The chart was checked and CBCT  were reviewed. He is tolerating his treatments well at this time. He denies any swallowing difficulties. His breathing overall is much improved is being admitted and discharged from the hospital.  Review of patient's allergies indicates no known allergies. Current Outpatient Prescriptions  Medication Sig Dispense Refill  . albuterol (PROVENTIL HFA;VENTOLIN HFA) 108 (90 BASE) MCG/ACT inhaler Inhale 2 puffs into the lungs every 6 (six) hours as needed for wheezing.  1 Inhaler  0  . amLODipine (NORVASC) 5 MG tablet Take 2 tablets (10 mg total) by mouth daily.  60 tablet  0  . aspirin 81 MG tablet Take 1 tablet (81 mg total) by mouth daily.  30 tablet  11  . feeding supplement (ENSURE COMPLETE) LIQD Take 237 mLs by mouth 2 (two) times daily between meals.  60 Bottle  0  . ferrous sulfate 325 (65 FE) MG tablet Take 1 tablet (325 mg total) by mouth 3 (three) times daily with meals.  90 tablet  3  . nicotine (NICODERM CQ - DOSED IN MG/24 HOURS) 14 mg/24hr patch Place 1 patch onto the skin daily.  28 patch  0   No current facility-administered medications for this encounter.   Labs:  Lab Results  Component  Value Date   WBC 22.0* 04/14/2013   HGB 8.6* 04/14/2013   HCT 30.3* 04/14/2013   MCV 68.6* 04/14/2013   PLT 527* 04/14/2013   Lab Results  Component Value Date   CREATININE 0.88 04/13/2013   BUN 11 04/13/2013   NA 135 04/13/2013   K 3.9 04/13/2013   CL 100 04/13/2013   CO2 27 04/13/2013   Lab Results  Component Value Date   ALT 9 04/04/2013   AST 13 04/04/2013   BILITOT 0.3 04/04/2013    Physical Examination:  vitals were not taken for this visit.   Wt Readings from Last 3 Encounters:  04/11/13 146 lb 9.7 oz (66.5 kg)  04/11/13 146 lb 9.7 oz (66.5 kg)  04/03/13 138 lb (62.596 kg)     Lungs - Normal respiratory effort, chest expands symmetrically. Lungs are clear to auscultation, no crackles or wheezes.  Heart has regular rhythm and rate  Abdomen is soft and non tender with normal bowel sounds  Assessment:  Patient tolerating treatments well  Plan: Continue treatment per original radiation prescription

## 2013-04-21 NOTE — Progress Notes (Signed)
Cameron Lam has received 4 fractions to his left lung.  He denies any SOB nor pain presently.

## 2013-04-21 NOTE — Progress Notes (Signed)
ERROR

## 2013-04-22 ENCOUNTER — Ambulatory Visit
Admit: 2013-04-22 | Discharge: 2013-04-22 | Disposition: A | Discharge status: Home / Self Care | Payer: Medicare Other | Attending: Radiation Oncology | Admitting: Radiation Oncology

## 2013-04-23 ENCOUNTER — Ambulatory Visit
Admit: 2013-04-23 | Discharge: 2013-04-23 | Disposition: A | Discharge status: Home / Self Care | Payer: Medicare Other | Attending: Radiation Oncology | Admitting: Radiation Oncology

## 2013-04-24 ENCOUNTER — Ambulatory Visit
Admit: 2013-04-24 | Discharge: 2013-04-24 | Disposition: A | Discharge status: Home / Self Care | Payer: Medicare Other | Attending: Radiation Oncology | Admitting: Radiation Oncology

## 2013-04-28 ENCOUNTER — Encounter: Payer: Self-pay | Admitting: Internal Medicine

## 2013-04-28 ENCOUNTER — Ambulatory Visit: Payer: Medicare Other

## 2013-04-28 ENCOUNTER — Other Ambulatory Visit (HOSPITAL_BASED_OUTPATIENT_CLINIC_OR_DEPARTMENT_OTHER): Payer: Medicare Other | Admitting: Lab

## 2013-04-28 ENCOUNTER — Ambulatory Visit
Admission: RE | Admit: 2013-04-28 | Discharge: 2013-04-28 | Disposition: A | Discharge status: Home / Self Care | Payer: Medicare Other | Source: Ambulatory Visit | Attending: Radiation Oncology | Admitting: Radiation Oncology

## 2013-04-28 ENCOUNTER — Ambulatory Visit (HOSPITAL_BASED_OUTPATIENT_CLINIC_OR_DEPARTMENT_OTHER): Payer: Medicare Other | Admitting: Internal Medicine

## 2013-04-28 VITALS — BP 134/67 | HR 94 | Temp 98.4°F | Resp 18 | Ht 67.0 in | Wt 137.9 lb

## 2013-04-28 DIAGNOSIS — C3490 Malignant neoplasm of unspecified part of unspecified bronchus or lung: Secondary | ICD-10-CM

## 2013-04-28 LAB — COMPREHENSIVE METABOLIC PANEL (CC13)
AST: 16 U/L (ref 5–34)
Albumin: 3 g/dL — ABNORMAL LOW (ref 3.5–5.0)
BUN: 12.5 mg/dL (ref 7.0–26.0)
Calcium: 9.1 mg/dL (ref 8.4–10.4)
Chloride: 104 mEq/L (ref 98–109)
Potassium: 4 mEq/L (ref 3.5–5.1)

## 2013-04-28 LAB — CBC WITH DIFFERENTIAL/PLATELET
Basophils Absolute: 0.1 10*3/uL (ref 0.0–0.1)
EOS%: 0.6 % (ref 0.0–7.0)
Eosinophils Absolute: 0.1 10*3/uL (ref 0.0–0.5)
HGB: 10.7 g/dL — ABNORMAL LOW (ref 13.0–17.1)
MCH: 22.7 pg — ABNORMAL LOW (ref 27.2–33.4)
NEUT#: 6.8 10*3/uL — ABNORMAL HIGH (ref 1.5–6.5)
RDW: 36.6 % — ABNORMAL HIGH (ref 11.0–14.6)
WBC: 16.8 10*3/uL — ABNORMAL HIGH (ref 4.0–10.3)
lymph#: 8.6 10*3/uL — ABNORMAL HIGH (ref 0.9–3.3)

## 2013-04-28 MED ORDER — PROCHLORPERAZINE MALEATE 10 MG PO TABS: 10.0000 mg | ORAL_TABLET | Freq: Four times a day (QID) | ORAL | Status: DC | PRN

## 2013-04-28 NOTE — Progress Notes (Signed)
Checked in new patient. Wants communication via phone/mail. The email is for his daughter. Didn't ask about poa/living will.

## 2013-04-28 NOTE — Progress Notes (Signed)
[facility] [phone]   [phone]  OFFICE PROGRESS NOTE  No PCP Per Patient [address] [place] [address]  DIAGNOSIS:  Metastatic non-small cell lung cancer, squamous cell carcinoma with large left lower lobe lung mass as well as liver metastasis diagnosed in June of 2014  PRIOR THERAPY: Palliative radiotherapy to the left lower lobe obstructing mass under the care of Dr. Roselind Messier  CURRENT THERAPY: Systemic chemotherapy with carboplatin for AUC of 5 and paclitaxel 175 mg/M2 every 3 weeks with Neulasta support. First cycle expected on 05/01/2013.  INTERVAL HISTORY: Cameron Lam. 79 y.o. male returns to the clinic today for hospital followup visit. He the patient seen earlier by Dr. Darrold Span and he is here today to establish care with me after she left the practice. The patient was recently admitted to [facility] with shortness of breath and imaging studies including CT scan of the chest showed central obstructing mass within the left lower lobe in addition to several spiculated lesions within the right lung concerning for metastasis. CT of the abdomen and pelvis showed 2 low density lesion in the liver suspicious for hepatic metastasis. There was also 2.0 hypodense lesion in the medial spleen and a splenic metastasis cannot be excluded. CT of the head showed no evidence for metastatic disease to the brain. On 04/10/2013 the patient underwent bronchoscopy which showed endobronchial tumor at the takeoff of the left lower lobe bronchus. The final pathology was consistent with invasive squamous cell carcinoma.  The patient was seen by Dr. Roselind Messier and he was started on palliative radiotherapy to the central obstructing mass in the left lower lobe.  He is here today for evaluation and discussion of his systemic treatment options. The patient is feeling fine today with no specific complaints except for mild cough productive of whitish  sputum. He denied having any significant chest pain or shortness breath. He denied having any hemoptysis. The patient denied having any headache or visual changes. Has no significant weight loss or night sweats.  MEDICAL HISTORY: Past Medical History  Diagnosis Date  . Shingles outbreak 06/16/10    Lt anterior and postior thigh/buttocks  . Hypertension   . Tobacco abuse     ALLERGIES:  has No Known Allergies.  MEDICATIONS:  Current Outpatient Prescriptions  Medication Sig Dispense Refill  . albuterol (PROVENTIL HFA;VENTOLIN HFA) 108 (90 BASE) MCG/ACT inhaler Inhale 2 puffs into the lungs every 6 (six) hours as needed for wheezing.  1 Inhaler  0  . amLODipine (NORVASC) 5 MG tablet Take 2 tablets (10 mg total) by mouth daily.  60 tablet  0  . aspirin 81 MG tablet Take 1 tablet (81 mg total) by mouth daily.  30 tablet  11  . feeding supplement (ENSURE COMPLETE) LIQD Take 237 mLs by mouth 2 (two) times daily between meals.  60 Bottle  0  . Multiple Vitamin (MULTIVITAMIN) tablet Take 1 tablet by mouth daily.      . nicotine (NICODERM CQ - DOSED IN MG/24 HOURS) 14 mg/24hr patch Place 1 patch onto the skin daily.  28 patch  0  . ferrous sulfate 325 (65 FE) MG tablet Take 1 tablet (325 mg total) by mouth 3 (three) times daily with meals.  90 tablet  3   No current facility-administered medications for this visit.    SURGICAL HISTORY:  Past Surgical History  Procedure Laterality Date  . Video bronchoscopy Bilateral 04/10/2013  Procedure: VIDEO BRONCHOSCOPY WITH FLUORO;  Surgeon: Merwyn Katos, MD;  Location: [facility];  Service: Cardiopulmonary;  Laterality: Bilateral;    REVIEW OF SYSTEMS:  A comprehensive review of systems was negative except for: Respiratory: positive for cough and sputum   PHYSICAL EXAMINATION: General appearance: alert, cooperative and no distress Head: Normocephalic, without obvious abnormality, atraumatic Neck: no adenopathy Lymph nodes: Cervical,  supraclavicular, and axillary nodes normal. Resp: diminished breath sounds LLL Back: symmetric, no curvature. ROM normal. No CVA tenderness. Cardio: regular rate and rhythm, S1, S2 normal, no murmur, click, rub or gallop GI: soft, non-tender; bowel sounds normal; no masses,  no organomegaly Extremities: extremities normal, atraumatic, no cyanosis or edema Neurologic: Alert and oriented X 3, normal strength and tone. Normal symmetric reflexes. Normal coordination and gait  ECOG PERFORMANCE STATUS: 1 - Symptomatic but completely ambulatory  Blood pressure 134/67, pulse 94, temperature 98.4 F (36.9 C), temperature source Oral, resp. rate 18, height 5\' 7"  (1.702 m), weight 137 lb 14.4 oz (62.551 kg).  LABORATORY DATA: Lab Results  Component Value Date   WBC 16.8* 04/28/2013   HGB 10.7* 04/28/2013   HCT 33.9* 04/28/2013   MCV 72.1* 04/28/2013   PLT 343 04/28/2013      Chemistry      Component Value Date/Time   NA 137 04/28/2013 1528   NA 135 04/13/2013 0530   K 4.0 04/28/2013 1528   K 3.9 04/13/2013 0530   CL 100 04/13/2013 0530   CO2 26 04/28/2013 1528   CO2 27 04/13/2013 0530   BUN 12.5 04/28/2013 1528   BUN 11 04/13/2013 0530   CREATININE 0.8 04/28/2013 1528   CREATININE 0.88 04/13/2013 0530      Component Value Date/Time   CALCIUM 9.1 04/28/2013 1528   CALCIUM 8.9 04/13/2013 0530   ALKPHOS 77 04/28/2013 1528   ALKPHOS 78 04/04/2013 1303   AST 16 04/28/2013 1528   AST 13 04/04/2013 1303   ALT 8 04/28/2013 1528   ALT 9 04/04/2013 1303   BILITOT 0.32 04/28/2013 1528   BILITOT 0.3 04/04/2013 1303       RADIOGRAPHIC STUDIES: Dg Chest 2 View  04/14/2013   *RADIOLOGY REPORT*  Clinical Data: Follow up pneumonia  CHEST - 2 VIEW  Comparison: Prior chest x-ray 04/11/2013  Findings: Persistent left lower lobe atelectasis.  The central obstructing mass is better demonstrated on recent prior CT scan. Developing patchy opacities in the right lower lobe.  The lungs remain hyperexpanded with bronchitic and  emphysematous changes. Stable cardiomegaly. No acute osseous abnormality.  IMPRESSION:  1.  Developing patchy opacities in the right mid lung could reflect pneumonia in the appropriate clinical setting. 2.  Stable appearance of known left central obstructing mass and left lower lobe atelectasis. 3.  COPD and emphysema   Original Report Authenticated By: Malachy Moan, M.D.   Dg Chest 2 View  04/11/2013   *RADIOLOGY REPORT*  Clinical Data: Bronchoscopy, 04/10/2013.  Hypertension.  Known left lower lobe mass and consolidation.  CHEST - 2 VIEW  Comparison: 04/06/2013  Findings: Mass-like fullness projects along the inferior left hilum surrounded by hazy airspace opacity that is posterior on the lateral view consistent with the left lower lobe consolidation and mass noted on the recent CT.  There is a small left pleural effusion.  The lungs are hyperexpanded reflecting COPD.  The lungs are otherwise clear.  The cardiac silhouette is normal in size.  No mediastinal or right hilar masses evident.  IMPRESSION: No acute findings.  No evidence of a complication following bronchoscopy.  Left lower lobe central mass and associated lung consolidation and small effusion is similar to the prior study. Underlying COPD.   Original Report Authenticated By: Amie Portland, M.D.   Dg Chest 2 View  04/03/2013   *RADIOLOGY REPORT*  Clinical Data: Shortness of breath, history hypertension, smoking  CHEST - 2 VIEW  Comparison: None  Findings: Upper normal heart size. Abnormal soft tissue density at the left pulmonary hilum. While a portion of this represents the left pulmonary artery, suspect retro hilar mass extending into the left lower lobe. Underlying emphysematous and bronchitic changes. Bibasilar atelectasis versus scarring. Atelectasis versus consolidation at medial left lung base. Upper lungs clear. No definite pleural effusion or pneumothorax.  IMPRESSION: COPD with suspect left retro hilar mass, cannot exclude tumor and  hilar adenopathy. Additional atelectasis versus consolidation in left lower lobe. Further evaluation by computed tomography with IV contrast recommended.  These results will be called to the ordering clinician or representative by the Radiologist Assistant, and communication documented in the PACS Dashboard.   Original Report Authenticated By: Ulyses Southward, M.D.   Ct Head W Contrast  04/14/2013   *RADIOLOGY REPORT*  Clinical Data: Lung cancer staging  CT HEAD WITH CONTRAST  Technique:  Contiguous axial images were obtained from the base of the skull through the vertex with intravenous contrast  Contrast: OMNIPAQUE IOHEXOL 300 MG/ML  SOLN  Comparison: None.  Findings: Diffuse brain atrophy noted with microvascular ischemic changes in the periventricular and subcortical white matter. 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 Cerebellar atrophy as well.  No acute intracranial hemorrhage, definite acute infarction, mass lesion, mass effect, focal edema, midline shift, herniation, hydrocephalus, or extra-axial fluid collection.  Gray-white matter differentiation maintained. Cisterns are patent.  Postcontrast, there is normal vascular enhancement.  No abnormal brain parenchymal enhancement to suggest metastatic disease. Mastoids clear.  Minor sinus mucosal thickening.  Postop changes of the maxillary sinuses.  No skull abnormality.  Symmetric orbits.  IMPRESSION: Atrophy and microvascular chronic ischemic changes.  Remote infarcts.  No acute process or evidence of intracranial metastatic disease.   Original Report Authenticated By: Judie Petit. Miles Costain, M.D.   Ct Chest W Contrast  04/04/2013   *RADIOLOGY REPORT*  Clinical Data: Palpitations and dyspnea  CT CHEST WITH CONTRAST  Technique:  Multidetector CT imaging of the chest was performed following the standard protocol during bolus administration of intravenous contrast.  Contrast: 80mL OMNIPAQUE IOHEXOL 300 MG/ML  SOLN  Comparison: None.   Findings: There is no pleural effusion identified.  Severe changes of  COPD/emphysema identified.  There is a large central obstructing mass occluding the right lower lobe airway.  This measures approximately 5.6 x 6.7 x 6.0 cm.  Central areas of low attenuation are noted suggestive of necrosis.   Endobronchial extension and occlusion of this tumor is noted, image 34/series 3. There is postobstructive consolidation of the much of the left lower lobe.  Spiculated right upper lobe perihilar lesion measures 1.9 cm. There is a right upper lobe spiculated lesion measuring 1.1 cm, image 31/series 3.  Normal heart size.  No pericardial effusion.  The subcarinal lymph node is enlarged measuring 2.5 cm, image 33/series 2.  Limited imaging through the upper abdomen shows an indeterminant low attenuation lesion in the left hepatic lobe measuring 1.9 cm, image 60/series 2.  Within the right hepatic lobe there is an indeterminate lesion measuring 2.3 cm, image 56/series 2.  Review of the visualized bony  structures shows no aggressive lytic or sclerotic bone lesions.  IMPRESSION:  1.  Central obstructing mass within the left lower lobe is present. There are several, suspicious spiculated lesions within the right lung which are concerning for metastases.  If this is a non-small cell lung cancer then this would be considered at least a T2bN2M1a. 2.  Indeterminant lesions within the liver.  Consider further evaluation with PET CT.   Original Report Authenticated By: Signa Kell, M.D.   Ct Abdomen Pelvis W Contrast  04/09/2013   *RADIOLOGY REPORT*  Clinical Data: Possible lung cancer.  Liver lesions seen on recent chest CT 04/04/2013  CT ABDOMEN AND PELVIS WITH CONTRAST  Technique:  Multidetector CT imaging of the abdomen and pelvis was performed following the standard protocol during bolus administration of intravenous contrast.  Contrast: OMNIPAQUE IOHEXOL 300 MG/ML  SOLN, 50mL OMNIPAQUE IOHEXOL 300 MG/ML  SOLN   Comparison: Chest CT 04/04/2013  Findings:  Lung bases:  Again seen is a dense consolidation throughout the visualized portion of the left lower lobe.  This is likely due to a central obstructing tumor as described on recent chest CT.  There is now a small left pleural effusion that appears new compared to recent CT.  In the there is new patchy airspace disease in the periphery of the right lower lobe near the right hemidiaphragm.  In the no definite pleural effusion on the right.  Mild cardiomegaly with coronary artery atherosclerotic calcifications.   In the left lobe of the liver is approximately 2 cm irregularly shaped lesion with Hounsfield units of approximately 26.  This appears similar to prior chest CT.  In the posterior right lobe of the liver is a 2.4 cm hypodense lesion with Hounsfield units of 32. Hepatic metastases cannot be excluded.  PET CT could be performed for further characterization.  In the medial spleen is a 2.0 cm hypodense lesion that measures 39 HU.  This is seen to better advantage on today's CT given differences in timing relative to contrast administration. A splenic metastasis cannot be excluded.  There are no prior images the earlier than June 2014 of the abdomen for comparison over time. The spleen is normal in size.  There is a paucity of intra-abdominal fat, which limits the evaluation for acute inflammatory changes.  The gallbladder is decompressed and unremarkable.  There is slight thickening of the limbs of the left adrenal gland, but no discrete adrenal nodule or mass is seen.  The right adrenal gland appears normal.  The pancreas and kidneys are within normal limits.  Bowel loops are normal in caliber.  The urinary bladder is moderately distended and the anterior bladder deviates toward the right of midline.  On image #74 of the axial images, there is a fluid-filled structure with peripheral enhancement in the right inguinal canal that is favored to be herniation of a small  portion of the urinary bladder into the inguinal hernia.  All of the small bowel loops in the abdomen and pelvis are well opacified with oral contrast.  No herniated bowel loops are seen.  The abdominal aorta is normal in caliber contains atherosclerotic calcification.  The prostate gland is enlarged and bulges upward into the bladder base.  No abdominal or pelvic lymphadenopathy is identified.  The imaged vertebral bodies are normal in height and alignment.  No suspicious osseous lesions are identified.  Negative for fracture.  IMPRESSION:  1.  Dense consolidation in the left lower lobe of the lung is likely  due to postobstructive pneumonitis in this patient finding with findings suspicious for a central obstructing malignancy in the left lower lobe as described on recent chest CT.  A small left pleural effusion is present, and is new. 2.  Two low-density lesions in the liver are suspicious for hepatic metastases.  These could be further evaluated with PET CT if desired. 3.  2.0 cm hypodense lesion in the medial spleen.  A splenic metastasis cannot be excluded.  Consider evaluation with PET CT. 4.  Probable herniation of a small portion of the anterior urinary bladder into the right inguinal hernia. 5.  Cardiomegaly and coronary artery atherosclerosis.   Original Report Authenticated By: Britta Mccreedy, M.D.   Dg Chest Port 1 View  04/06/2013   *RADIOLOGY REPORT*  Clinical Data: Shortness of breath after transfusion.  PORTABLE CHEST - 1 VIEW  Comparison: 04/03/2013 and CT from 04/04/2013  Findings: Portable upright view of the chest demonstrates densities in the left lower chest.  There may also be left pleural fluid. Upper lungs remain clear. Heart size is grossly stable.  Again note is fullness in the left hilar region related to the known opacity or lesion.  IMPRESSION: Densities in the left mid and lower chest.  Findings are similar to the recent CT but there has been progression since 04/03/2013. Findings  could represent postobstructive pneumonitis and/or volume loss.  There may be pleural fluid.   Original Report Authenticated By: Richarda Overlie, M.D.    ASSESSMENT AND PLAN: This is a very pleasant 79 years old African American male recently diagnosed with metastatic non-small cell lung cancer, squamous cell carcinoma with liver metastasis. The patient is currently undergoing palliative radiotherapy to the central obstructing left lower lobe lung mass. I have a lengthy discussion with the patient today about his current disease stage, prognosis and treatment options. I showed him the images of the CT scans.  I gave the patient the option of palliative care and hospice referral versus systemic chemotherapy with carboplatin and paclitaxel. The patient is interested in proceeding with systemic chemotherapy. He would be treated with carboplatin for AUC of 5 and paclitaxel 175 mg/M2 with Neulasta support every 3 weeks. I discussed with the patient adverse effect of the chemotherapy including but not limited to alopecia, myelosuppression, nausea and vomiting, peripheral neuropathy, liver or renal dysfunction. He would like to start chemotherapy as soon as possible and I will arrange for him to receive the first cycle of this treatment on 05/01/2013. He would have a chemoradiation class before starting the first cycle of his treatment. I will call his pharmacy with prescription for Compazine 10 mg by mouth every 6 hours as needed for nausea. The patient would come back for followup visit in 10 days for reevaluation and management any adverse effect of his treatment. He was advised to call immediately if he has any concerning symptoms in the interval. All questions were answered. The patient knows to call the clinic with any problems, questions or concerns. We can certainly see the patient much sooner if necessary.  I spent 40 minutes counseling the patient face to face. The total time spent in the appointment  was 55 minutes.

## 2013-04-28 NOTE — Patient Instructions (Signed)
You are recently diagnosed with stage IV non-small cell lung cancer. We discussed treatment options including palliative care versus systemic chemotherapy with carboplatin and paclitaxel.  You elected to proceed with chemotherapy and her cycle is scheduled for 05/01/2013.

## 2013-04-29 ENCOUNTER — Ambulatory Visit
Admission: RE | Admit: 2013-04-29 | Discharge: 2013-04-29 | Disposition: A | Discharge status: Home / Self Care | Payer: Medicare Other | Source: Ambulatory Visit | Attending: Radiation Oncology | Admitting: Radiation Oncology

## 2013-04-29 ENCOUNTER — Encounter: Payer: Self-pay | Admitting: Radiation Oncology

## 2013-04-29 ENCOUNTER — Other Ambulatory Visit: Payer: Self-pay | Admitting: Medical Oncology

## 2013-04-29 ENCOUNTER — Telehealth: Payer: Self-pay | Admitting: Medical Oncology

## 2013-04-29 ENCOUNTER — Ambulatory Visit
Admit: 2013-04-29 | Discharge: 2013-04-29 | Disposition: A | Discharge status: Home / Self Care | Payer: Medicare Other | Attending: Radiation Oncology | Admitting: Radiation Oncology

## 2013-04-29 VITALS — BP 131/80 | HR 87 | Temp 98.6°F | Resp 20 | Wt 137.7 lb

## 2013-04-29 DIAGNOSIS — C3492 Malignant neoplasm of unspecified part of left bronchus or lung: Secondary | ICD-10-CM

## 2013-04-29 DIAGNOSIS — C3490 Malignant neoplasm of unspecified part of unspecified bronchus or lung: Secondary | ICD-10-CM | POA: Insufficient documentation

## 2013-04-29 HISTORY — DX: Malignant neoplasm of unspecified part of unspecified bronchus or lung: C34.90

## 2013-04-29 NOTE — Telephone Encounter (Signed)
lvm fo rpt regarding to appt °

## 2013-04-29 NOTE — Telephone Encounter (Signed)
Daughter calling to ask about treatment plan specifics.I told her to attend chemo class tomorrow.She said she will attend.

## 2013-04-29 NOTE — Telephone Encounter (Signed)
Per staff message and POF I have scheduled appts.  JMW  

## 2013-04-29 NOTE — Progress Notes (Signed)
Pt denies pain, loss of appetite, cough, SOB. He does report some fatigue.

## 2013-04-29 NOTE — Progress Notes (Signed)
[facility]    Radiation Oncology [address] [place]     Maryln Gottron, M.D. [place], [place] [address]               Billie Lade, M.D., Ph.D. Phone: [phone]      Molli Hazard A. Kathrynn Running, M.D. Fax: [phone]      Radene Gunning, M.D., Ph.D.         Lurline Hare, M.D.         Grayland Jack, M.D Weekly Treatment Management Note  Name: Cameron Lam.     MRN: [id number]        CSN: [id number] Date: 04/29/2013      DOB: 1934-04-08  CC: No PCP Per Patient         No ref. provider found    Status: Outpatient  Diagnosis: The encounter diagnosis was Squamous cell carcinoma lung, left.  Current Dose: 22.5 Gy  Current Fraction: 9  Planned Dose: 37.5 Gy  Narrative: Cameron Lam. was seen today for weekly treatment management. The chart was checked and port films  were reviewed. He is tolerating his treatments well at this time. He denies any pain with swallowing or difficulty with swallowing. Overall his breathing is improved since being admitted to the hospital. He denies any significant cough or hemoptysis. The patient did start chemotherapy late last week.  Review of patient's allergies indicates no known allergies.  Current Outpatient Prescriptions  Medication Sig Dispense Refill  . albuterol (PROVENTIL HFA;VENTOLIN HFA) 108 (90 BASE) MCG/ACT inhaler Inhale 2 puffs into the lungs every 6 (six) hours as needed for wheezing.  1 Inhaler  0  . amLODipine (NORVASC) 5 MG tablet Take 2 tablets (10 mg total) by mouth daily.  60 tablet  0  . aspirin 81 MG tablet Take 1 tablet (81 mg total) by mouth daily.  30 tablet  11  . feeding supplement (ENSURE COMPLETE) LIQD Take 237 mLs by mouth 2 (two) times daily between meals.  60 Bottle  0  . ferrous sulfate 325 (65 FE) MG tablet Take 1 tablet (325 mg total) by mouth 3 (three) times daily with meals.  90 tablet  3  . Multiple Vitamin (MULTIVITAMIN) tablet Take 1 tablet by mouth daily.      . prochlorperazine  (COMPAZINE) 10 MG tablet Take 1 tablet (10 mg total) by mouth every 6 (six) hours as needed.  60 tablet  0   No current facility-administered medications for this encounter.   Labs:  Lab Results  Component Value Date   WBC 16.8* 04/28/2013   HGB 10.7* 04/28/2013   HCT 33.9* 04/28/2013   MCV 72.1* 04/28/2013   PLT 343 04/28/2013   Lab Results  Component Value Date   CREATININE 0.8 04/28/2013   BUN 12.5 04/28/2013   NA 137 04/28/2013   K 4.0 04/28/2013   CL 100 04/13/2013   CO2 26 04/28/2013   Lab Results  Component Value Date   ALT 8 04/28/2013   AST 16 04/28/2013   BILITOT 0.32 04/28/2013    Physical Examination:  weight is 137 lb 11.2 oz (62.46 kg). His oral temperature is 98.6 F (37 C). His blood pressure is 131/80 and his pulse is 87. His respiration is 20 and oxygen saturation is 97%.    Wt Readings from Last 3 Encounters:  04/29/13 137 lb 11.2 oz (62.46 kg)  04/28/13 137 lb 14.4 oz (62.551 kg)  04/11/13 146 lb 9.7 oz (66.5  kg)    The oral cavity is free of secondary infection. Lungs - Normal respiratory effort, chest expands symmetrically. Lungs are clear to auscultation, no crackles or wheezes.  Heart has regular rhythm and rate  Abdomen is soft and non tender with normal bowel sounds  Assessment:  Patient tolerating treatments well  Plan: Continue treatment per original radiation prescription

## 2013-04-30 ENCOUNTER — Ambulatory Visit
Admit: 2013-04-30 | Discharge: 2013-04-30 | Disposition: A | Discharge status: Home / Self Care | Payer: Medicare Other | Attending: Radiation Oncology | Admitting: Radiation Oncology

## 2013-04-30 ENCOUNTER — Encounter: Payer: Self-pay | Admitting: *Deleted

## 2013-04-30 ENCOUNTER — Other Ambulatory Visit: Payer: Medicare Other

## 2013-05-01 ENCOUNTER — Ambulatory Visit (HOSPITAL_BASED_OUTPATIENT_CLINIC_OR_DEPARTMENT_OTHER): Payer: Medicare Other

## 2013-05-01 ENCOUNTER — Other Ambulatory Visit (HOSPITAL_BASED_OUTPATIENT_CLINIC_OR_DEPARTMENT_OTHER): Payer: Medicare Other | Admitting: Lab

## 2013-05-01 ENCOUNTER — Ambulatory Visit
Admission: RE | Admit: 2013-05-01 | Discharge: 2013-05-01 | Disposition: A | Discharge status: Home / Self Care | Payer: Medicare Other | Source: Ambulatory Visit | Attending: Radiation Oncology | Admitting: Radiation Oncology

## 2013-05-01 VITALS — BP 134/78 | HR 77 | Temp 98.3°F | Resp 16

## 2013-05-01 DIAGNOSIS — Z5111 Encounter for antineoplastic chemotherapy: Secondary | ICD-10-CM

## 2013-05-01 DIAGNOSIS — C3490 Malignant neoplasm of unspecified part of unspecified bronchus or lung: Secondary | ICD-10-CM

## 2013-05-01 DIAGNOSIS — C3430 Malignant neoplasm of lower lobe, unspecified bronchus or lung: Secondary | ICD-10-CM

## 2013-05-01 DIAGNOSIS — C3492 Malignant neoplasm of unspecified part of left bronchus or lung: Secondary | ICD-10-CM

## 2013-05-01 LAB — COMPREHENSIVE METABOLIC PANEL (CC13)
ALT: 8 U/L (ref 0–55)
BUN: 13.3 mg/dL (ref 7.0–26.0)
CO2: 25 mEq/L (ref 22–29)
Calcium: 9 mg/dL (ref 8.4–10.4)
Chloride: 104 mEq/L (ref 98–109)
Creatinine: 0.8 mg/dL (ref 0.7–1.3)
Glucose: 121 mg/dl (ref 70–140)
Total Bilirubin: 0.22 mg/dL (ref 0.20–1.20)

## 2013-05-01 LAB — CBC WITH DIFFERENTIAL/PLATELET
Basophils Absolute: 0.1 10*3/uL (ref 0.0–0.1)
Eosinophils Absolute: 0 10*3/uL (ref 0.0–0.5)
LYMPH%: 28.2 % (ref 14.0–49.0)
MCHC: 30.1 g/dL — ABNORMAL LOW (ref 32.0–36.0)
MONO#: 1.1 10*3/uL — ABNORMAL HIGH (ref 0.1–0.9)
MONO%: 7.8 % (ref 0.0–14.0)
NEUT#: 9.1 10*3/uL — ABNORMAL HIGH (ref 1.5–6.5)
NEUT%: 63.5 % (ref 39.0–75.0)

## 2013-05-01 MED ORDER — SODIUM CHLORIDE 0.9 % IV SOLN
Freq: Once | INTRAVENOUS | Status: AC
  Administered 2013-05-01: 11:00:00 via INTRAVENOUS

## 2013-05-01 MED ORDER — SODIUM CHLORIDE 0.9 % IV SOLN
175.0000 mg/m2 | Freq: Once | INTRAVENOUS | Status: AC
  Administered 2013-05-01: 300 mg via INTRAVENOUS
  Filled 2013-05-01: qty 50

## 2013-05-01 MED ORDER — DIPHENHYDRAMINE HCL 50 MG/ML IJ SOLN
50.0000 mg | Freq: Once | INTRAMUSCULAR | Status: AC
  Administered 2013-05-01: 50 mg via INTRAVENOUS

## 2013-05-01 MED ORDER — SODIUM CHLORIDE 0.9 % IV SOLN
473.0000 mg | Freq: Once | INTRAVENOUS | Status: AC
  Administered 2013-05-01: 470 mg via INTRAVENOUS
  Filled 2013-05-01: qty 47

## 2013-05-01 MED ORDER — DEXAMETHASONE SODIUM PHOSPHATE 20 MG/5ML IJ SOLN
20.0000 mg | Freq: Once | INTRAMUSCULAR | Status: AC
  Administered 2013-05-01: 20 mg via INTRAVENOUS

## 2013-05-01 MED ORDER — ONDANSETRON 16 MG/50ML IVPB (CHCC)
16.0000 mg | Freq: Once | INTRAVENOUS | Status: AC
  Administered 2013-05-01: 16 mg via INTRAVENOUS

## 2013-05-01 MED ORDER — FAMOTIDINE IN NACL 20-0.9 MG/50ML-% IV SOLN
20.0000 mg | Freq: Once | INTRAVENOUS | Status: AC
  Administered 2013-05-01: 20 mg via INTRAVENOUS

## 2013-05-01 NOTE — Patient Instructions (Addendum)
[facility] Discharge Instructions for Patients Receiving Chemotherapy  Today you received the following chemotherapy agents Taxol/Carboplatin To help prevent nausea and vomiting after your treatment, we encourage you to take your nausea medication as prescribed.  If you develop nausea and vomiting that is not controlled by your nausea medication, call the clinic.   BELOW ARE SYMPTOMS THAT SHOULD BE REPORTED IMMEDIATELY:  *FEVER GREATER THAN 100.5 F  *CHILLS WITH OR WITHOUT FEVER  NAUSEA AND VOMITING THAT IS NOT CONTROLLED WITH YOUR NAUSEA MEDICATION  *UNUSUAL SHORTNESS OF BREATH  *UNUSUAL BRUISING OR BLEEDING  TENDERNESS IN MOUTH AND THROAT WITH OR WITHOUT PRESENCE OF ULCERS  *URINARY PROBLEMS  *BOWEL PROBLEMS  UNUSUAL RASH Items with * indicate a potential emergency and should be followed up as soon as possible.  Feel free to call the clinic you have any questions or concerns. The clinic phone number is [phone].    

## 2013-05-02 ENCOUNTER — Ambulatory Visit
Admit: 2013-05-02 | Discharge: 2013-05-02 | Disposition: A | Discharge status: Home / Self Care | Payer: Medicare Other | Attending: Radiation Oncology | Admitting: Radiation Oncology

## 2013-05-02 ENCOUNTER — Ambulatory Visit (HOSPITAL_BASED_OUTPATIENT_CLINIC_OR_DEPARTMENT_OTHER): Payer: Medicare Other

## 2013-05-02 ENCOUNTER — Encounter: Payer: Self-pay | Admitting: *Deleted

## 2013-05-02 ENCOUNTER — Telehealth: Payer: Self-pay | Admitting: *Deleted

## 2013-05-02 VITALS — BP 107/70 | HR 84 | Temp 98.2°F

## 2013-05-02 DIAGNOSIS — Z5189 Encounter for other specified aftercare: Secondary | ICD-10-CM

## 2013-05-02 DIAGNOSIS — C3492 Malignant neoplasm of unspecified part of left bronchus or lung: Secondary | ICD-10-CM

## 2013-05-02 DIAGNOSIS — C3430 Malignant neoplasm of lower lobe, unspecified bronchus or lung: Secondary | ICD-10-CM

## 2013-05-02 DIAGNOSIS — C787 Secondary malignant neoplasm of liver and intrahepatic bile duct: Secondary | ICD-10-CM

## 2013-05-02 MED ORDER — PEGFILGRASTIM INJECTION 6 MG/0.6ML
6.0000 mg | Freq: Once | SUBCUTANEOUS | Status: AC
  Administered 2013-05-02: 6 mg via SUBCUTANEOUS
  Filled 2013-05-02: qty 0.6

## 2013-05-02 NOTE — Patient Instructions (Addendum)

## 2013-05-02 NOTE — Telephone Encounter (Signed)
Mr Kimple here for Neulasta injection following 1st taxol/carbo chemotherapy.  States that he is doing pretty well, just a little nausea that is relieved with the antiemetic.  Encouraged to drink more fluids-16 oz every couple hours.  Knows to call the office if he has any problems or concerns.

## 2013-05-05 ENCOUNTER — Ambulatory Visit
Admit: 2013-05-05 | Discharge: 2013-05-05 | Disposition: A | Discharge status: Home / Self Care | Payer: Medicare Other | Attending: Radiation Oncology | Admitting: Radiation Oncology

## 2013-05-06 ENCOUNTER — Encounter: Payer: Self-pay | Admitting: Radiation Oncology

## 2013-05-06 ENCOUNTER — Ambulatory Visit: Payer: Medicare Other

## 2013-05-06 ENCOUNTER — Ambulatory Visit
Admission: RE | Admit: 2013-05-06 | Discharge: 2013-05-06 | Disposition: A | Discharge status: Home / Self Care | Payer: Medicare Other | Source: Ambulatory Visit | Attending: Radiation Oncology | Admitting: Radiation Oncology

## 2013-05-06 VITALS — BP 104/71 | HR 125 | Temp 97.6°F | Resp 20 | Wt 135.9 lb

## 2013-05-06 DIAGNOSIS — C3492 Malignant neoplasm of unspecified part of left bronchus or lung: Secondary | ICD-10-CM

## 2013-05-06 NOTE — Progress Notes (Signed)
[facility]    Radiation Oncology [address] [place]     Maryln Gottron, M.D. [place], [place] [address]               Billie Lade, M.D., Ph.D. Phone: [phone]      Molli Hazard A. Kathrynn Running, M.D. Fax: [phone]      Radene Gunning, M.D., Ph.D.         Lurline Hare, M.D.         Grayland Jack, M.D Weekly Treatment Management Note  Name: Cameron Lam.     MRN: [id number]        CSN: [id number] Date: 05/06/2013      DOB: 05-22-1934  CC: No PCP Per Patient         No ref. provider found    Status: Outpatient  Diagnosis: The encounter diagnosis was Squamous cell carcinoma lung, left.  Current Dose: 35 Gy  Current Fraction: 14  Planned Dose: 37.5 Gy  Narrative: Cameron Lam. was seen today for weekly treatment management. The chart was checked and port films  were reviewed.  He did start chemotherapy last week and has had some nausea.  He also admits that food taste poorly at this time.  He denies any pain in the chest or significant cough. He denies any hemoptysis. He does have some fatigue at this time.  He denies any dizziness with standing.  Review of patient's allergies indicates no known allergies. Current Outpatient Prescriptions  Medication Sig Dispense Refill  . albuterol (PROVENTIL HFA;VENTOLIN HFA) 108 (90 BASE) MCG/ACT inhaler Inhale 2 puffs into the lungs every 6 (six) hours as needed for wheezing.  1 Inhaler  0  . amLODipine (NORVASC) 5 MG tablet Take 2 tablets (10 mg total) by mouth daily.  60 tablet  0  . aspirin 81 MG tablet Take 1 tablet (81 mg total) by mouth daily.  30 tablet  11  . feeding supplement (ENSURE COMPLETE) LIQD Take 237 mLs by mouth 2 (two) times daily between meals.  60 Bottle  0  . ferrous sulfate 325 (65 FE) MG tablet Take 1 tablet (325 mg total) by mouth 3 (three) times daily with meals.  90 tablet  3  . Multiple Vitamin (MULTIVITAMIN) tablet Take 1 tablet by mouth daily.      . prochlorperazine (COMPAZINE) 10 MG  tablet Take 1 tablet (10 mg total) by mouth every 6 (six) hours as needed.  60 tablet  0   No current facility-administered medications for this encounter.   Labs:  Lab Results  Component Value Date   WBC 14.3* 05/01/2013   HGB 10.8* 05/01/2013   HCT 35.9* 05/01/2013   MCV 74.6* 05/01/2013   PLT 281 05/01/2013   Lab Results  Component Value Date   CREATININE 0.8 05/01/2013   BUN 13.3 05/01/2013   NA 137 05/01/2013   K 4.0 05/01/2013   CL 100 04/13/2013   CO2 25 05/01/2013   Lab Results  Component Value Date   ALT 8 05/01/2013   AST 13 05/01/2013   BILITOT 0.22 05/01/2013    Physical Examination:  weight is 135 lb 14.4 oz (61.644 kg). His oral temperature is 97.6 F (36.4 C). His blood pressure is 104/71 and his pulse is 125. His respiration is 20 and oxygen saturation is 96%.    Wt Readings from Last 3 Encounters:  05/06/13 135 lb 14.4 oz (61.644 kg)  04/29/13 137 lb 11.2 oz (62.46 kg)  04/28/13 137 lb 14.4 oz (62.551 kg)    The oral cavity is moist without secondary infection Lungs - Normal respiratory effort, chest expands symmetrically. Lungs are clear to auscultation, no crackles or wheezes.  Heart has regular rhythm with an increased rate  Abdomen is soft and non tender with normal bowel sounds  Assessment:  Patient tolerating treatments well  Plan: Continue treatment per original radiation prescription.  Patient will be seen again tomorrow in light of his elevated heart rate.  He is not showing any orthostatic changes today.

## 2013-05-06 NOTE — Progress Notes (Addendum)
Pt denies pain, SOB. He does report occasional dry cough, fatigue, loss of appetite. He states his "tastes buds are off". Pt c/o slight nausea this morning. Informed him that he should have Compazine at home for nausea per his med list. Gave him soda, crackers. Pt drinks Ensure 2 -3 cans daily. Pt completes tomorrow, gave him 1 month fu card.

## 2013-05-07 ENCOUNTER — Ambulatory Visit: Payer: Medicare Other

## 2013-05-07 ENCOUNTER — Ambulatory Visit
Admission: RE | Admit: 2013-05-07 | Discharge: 2013-05-07 | Disposition: A | Discharge status: Home / Self Care | Payer: Medicare Other | Source: Ambulatory Visit | Attending: Radiation Oncology | Admitting: Radiation Oncology

## 2013-05-08 ENCOUNTER — Ambulatory Visit: Payer: Medicare Other

## 2013-05-08 ENCOUNTER — Other Ambulatory Visit (HOSPITAL_BASED_OUTPATIENT_CLINIC_OR_DEPARTMENT_OTHER): Payer: Medicare Other | Admitting: Lab

## 2013-05-08 ENCOUNTER — Ambulatory Visit (HOSPITAL_BASED_OUTPATIENT_CLINIC_OR_DEPARTMENT_OTHER): Payer: Medicare Other | Admitting: Physician Assistant

## 2013-05-08 VITALS — BP 131/75 | HR 83 | Temp 97.8°F | Resp 20 | Ht 67.0 in | Wt 136.3 lb

## 2013-05-08 DIAGNOSIS — C3490 Malignant neoplasm of unspecified part of unspecified bronchus or lung: Secondary | ICD-10-CM

## 2013-05-08 DIAGNOSIS — C787 Secondary malignant neoplasm of liver and intrahepatic bile duct: Secondary | ICD-10-CM

## 2013-05-08 DIAGNOSIS — C3430 Malignant neoplasm of lower lobe, unspecified bronchus or lung: Secondary | ICD-10-CM

## 2013-05-08 LAB — COMPREHENSIVE METABOLIC PANEL (CC13)
ALT: 12 U/L (ref 0–55)
CO2: 26 mEq/L (ref 22–29)
Calcium: 8.9 mg/dL (ref 8.4–10.4)
Chloride: 104 mEq/L (ref 98–109)
Creatinine: 0.8 mg/dL (ref 0.7–1.3)
Glucose: 74 mg/dl (ref 70–140)
Total Protein: 6 g/dL — ABNORMAL LOW (ref 6.4–8.3)

## 2013-05-08 LAB — CBC WITH DIFFERENTIAL/PLATELET
BASO%: 0.1 % (ref 0.0–2.0)
Eosinophils Absolute: 0.2 10*3/uL (ref 0.0–0.5)
HCT: 31.4 % — ABNORMAL LOW (ref 38.4–49.9)
HGB: 9.7 g/dL — ABNORMAL LOW (ref 13.0–17.1)
MCHC: 30.9 g/dL — ABNORMAL LOW (ref 32.0–36.0)
MONO#: 1.9 10*3/uL — ABNORMAL HIGH (ref 0.1–0.9)
NEUT#: 28.9 10*3/uL — ABNORMAL HIGH (ref 1.5–6.5)
NEUT%: 73.3 % (ref 39.0–75.0)
Platelets: 217 10*3/uL (ref 140–400)
WBC: 39.4 10*3/uL — ABNORMAL HIGH (ref 4.0–10.3)
lymph#: 8.4 10*3/uL — ABNORMAL HIGH (ref 0.9–3.3)

## 2013-05-08 NOTE — Patient Instructions (Addendum)
Continue weekly labs a scheduled Follow up in 2 weeks, prior to your next scheduled cycle of chemotherapy

## 2013-05-08 NOTE — Progress Notes (Addendum)
[facility] Telephone:([phone]) [phone]   [phone]  SHARED VISIT PROGRESS NOTE  No PCP Per Patient [address] [place] [address]  DIAGNOSIS:  Metastatic non-small cell lung cancer, squamous cell carcinoma with large left lower lobe lung mass as well as liver metastasis diagnosed in June of 2014  PRIOR THERAPY: Palliative radiotherapy to the left lower lobe obstructing mass under the care of Dr. Roselind Messier  CURRENT THERAPY: Systemic chemotherapy with carboplatin for AUC of 5 and paclitaxel 175 mg/M2 every 3 weeks with Neulasta support. Status post 1 cycle  INTERVAL HISTORY: Cameron Lam. 79 y.o. male returns to the clinic today for symptom management visit. He reports that he tolerated his first cycle of systemic chemotherapy with carboplatin and paclitaxel with Neulasta support without significant difficulty. He did have some mild nausea without vomiting but this was well-controlled with his Compazine. He states that he is sleeping good and eating well. He is able to keep up with the yard work. He does. He is drinking 2-3 Ensure nutritional supplements daily as well as eating well in between. He voices no specific complaints today. He denies any fever, chills, diarrhea, constipation, cough, shortness of breath or hemoptysis.  The patient denied having any headache or visual changes. Has no significant weight loss or night sweats.  MEDICAL HISTORY: Past Medical History  Diagnosis Date  . Shingles outbreak 06/16/10    Lt anterior and postior thigh/buttocks  . Hypertension   . Tobacco abuse   . Lung cancer     ALLERGIES:  has No Known Allergies.  MEDICATIONS:  Current Outpatient Prescriptions  Medication Sig Dispense Refill  . albuterol (PROVENTIL HFA;VENTOLIN HFA) 108 (90 BASE) MCG/ACT inhaler Inhale 2 puffs into the lungs every 6 (six) hours as needed for wheezing.  1 Inhaler  0  . amLODipine (NORVASC) 5 MG tablet Take 2 tablets (10 mg total) by  mouth daily.  60 tablet  0  . aspirin 81 MG tablet Take 1 tablet (81 mg total) by mouth daily.  30 tablet  11  . feeding supplement (ENSURE COMPLETE) LIQD Take 237 mLs by mouth 2 (two) times daily between meals.  60 Bottle  0  . ferrous sulfate 325 (65 FE) MG tablet Take 1 tablet (325 mg total) by mouth 3 (three) times daily with meals.  90 tablet  3  . Multiple Vitamin (MULTIVITAMIN) tablet Take 1 tablet by mouth daily.      . prochlorperazine (COMPAZINE) 10 MG tablet Take 1 tablet (10 mg total) by mouth every 6 (six) hours as needed.  60 tablet  0   No current facility-administered medications for this visit.    SURGICAL HISTORY:  Past Surgical History  Procedure Laterality Date  . Video bronchoscopy Bilateral 04/10/2013    Procedure: VIDEO BRONCHOSCOPY WITH FLUORO;  Surgeon: Merwyn Katos, MD;  Location: [facility];  Service: Cardiopulmonary;  Laterality: Bilateral;    REVIEW OF SYSTEMS:  A comprehensive review of systems was negative except for: Gastrointestinal: positive for nausea   PHYSICAL EXAMINATION: General appearance: alert, cooperative and no distress Head: Normocephalic, without obvious abnormality, atraumatic Neck: no adenopathy Lymph nodes: Cervical, supraclavicular, and axillary nodes normal. Resp: diminished breath sounds LLL Back: symmetric, no curvature. ROM normal. No CVA tenderness. Cardio: regular rate and rhythm, S1, S2 normal, no murmur, click, rub or gallop GI: soft, non-tender; bowel sounds normal; no masses,  no organomegaly Extremities: extremities normal, atraumatic, no cyanosis or edema Neurologic:  Alert and oriented X 3, normal strength and tone. Normal symmetric reflexes. Normal coordination and gait  ECOG PERFORMANCE STATUS: 1 - Symptomatic but completely ambulatory  Blood pressure 131/75, pulse 83, temperature 97.8 F (36.6 C), temperature source Oral, resp. rate 20, height 5\' 7"  (1.702 m), weight 136 lb 4.8 oz (61.825 kg).  LABORATORY  DATA: Lab Results  Component Value Date   WBC 39.4* 05/08/2013   HGB 9.7* 05/08/2013   HCT 31.4* 05/08/2013   MCV 73.8* 05/08/2013   PLT 217 05/08/2013      Chemistry      Component Value Date/Time   NA 138 05/08/2013 1145   NA 135 04/13/2013 0530   K 3.9 05/08/2013 1145   K 3.9 04/13/2013 0530   CL 100 04/13/2013 0530   CO2 26 05/08/2013 1145   CO2 27 04/13/2013 0530   BUN 9.3 05/08/2013 1145   BUN 11 04/13/2013 0530   CREATININE 0.8 05/08/2013 1145   CREATININE 0.88 04/13/2013 0530      Component Value Date/Time   CALCIUM 8.9 05/08/2013 1145   CALCIUM 8.9 04/13/2013 0530   ALKPHOS 145 05/08/2013 1145   ALKPHOS 78 04/04/2013 1303   AST 19 05/08/2013 1145   AST 13 04/04/2013 1303   ALT 12 05/08/2013 1145   ALT 9 04/04/2013 1303   BILITOT <0.20 Repeated and Verified 05/08/2013 1145   BILITOT 0.3 04/04/2013 1303       RADIOGRAPHIC STUDIES: Dg Chest 2 View  04/14/2013   *RADIOLOGY REPORT*  Clinical Data: Follow up pneumonia  CHEST - 2 VIEW  Comparison: Prior chest x-ray 04/11/2013  Findings: Persistent left lower lobe atelectasis.  The central obstructing mass is better demonstrated on recent prior CT scan. Developing patchy opacities in the right lower lobe.  The lungs remain hyperexpanded with bronchitic and emphysematous changes. Stable cardiomegaly. No acute osseous abnormality.  IMPRESSION:  1.  Developing patchy opacities in the right mid lung could reflect pneumonia in the appropriate clinical setting. 2.  Stable appearance of known left central obstructing mass and left lower lobe atelectasis. 3.  COPD and emphysema   Original Report Authenticated By: Malachy Moan, M.D.   Dg Chest 2 View  04/11/2013   *RADIOLOGY REPORT*  Clinical Data: Bronchoscopy, 04/10/2013.  Hypertension.  Known left lower lobe mass and consolidation.  CHEST - 2 VIEW  Comparison: 04/06/2013  Findings: Mass-like fullness projects along the inferior left hilum surrounded by hazy airspace opacity that is posterior on the  lateral view consistent with the left lower lobe consolidation and mass noted on the recent CT.  There is a small left pleural effusion.  The lungs are hyperexpanded reflecting COPD.  The lungs are otherwise clear.  The cardiac silhouette is normal in size.  No mediastinal or right hilar masses evident.  IMPRESSION: No acute findings.  No evidence of a complication following bronchoscopy.  Left lower lobe central mass and associated lung consolidation and small effusion is similar to the prior study. Underlying COPD.   Original Report Authenticated By: Amie Portland, M.D.   Dg Chest 2 View  04/03/2013   *RADIOLOGY REPORT*  Clinical Data: Shortness of breath, history hypertension, smoking  CHEST - 2 VIEW  Comparison: None  Findings: Upper normal heart size. Abnormal soft tissue density at the left pulmonary hilum. While a portion of this represents the left pulmonary artery, suspect retro hilar mass extending into the left lower lobe. Underlying emphysematous and bronchitic changes. Bibasilar atelectasis versus scarring. Atelectasis versus consolidation at medial  left lung base. Upper lungs clear. No definite pleural effusion or pneumothorax.  IMPRESSION: COPD with suspect left retro hilar mass, cannot exclude tumor and hilar adenopathy. Additional atelectasis versus consolidation in left lower lobe. Further evaluation by computed tomography with IV contrast recommended.  These results will be called to the ordering clinician or representative by the Radiologist Assistant, and communication documented in the PACS Dashboard.   Original Report Authenticated By: Ulyses Southward, M.D.   Ct Head W Contrast  04/14/2013   *RADIOLOGY REPORT*  Clinical Data: Lung cancer staging  CT HEAD WITH CONTRAST  Technique:  Contiguous axial images were obtained from the base of the skull through the vertex with intravenous contrast  Contrast: OMNIPAQUE IOHEXOL 300 MG/ML  SOLN  Comparison: None.  Findings: Diffuse brain atrophy  noted with microvascular ischemic changes in the periventricular and subcortical white matter. 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 Cerebellar atrophy as well.  No acute intracranial hemorrhage, definite acute infarction, mass lesion, mass effect, focal edema, midline shift, herniation, hydrocephalus, or extra-axial fluid collection.  Gray-white matter differentiation maintained. Cisterns are patent.  Postcontrast, there is normal vascular enhancement.  No abnormal brain parenchymal enhancement to suggest metastatic disease. Mastoids clear.  Minor sinus mucosal thickening.  Postop changes of the maxillary sinuses.  No skull abnormality.  Symmetric orbits.  IMPRESSION: Atrophy and microvascular chronic ischemic changes.  Remote infarcts.  No acute process or evidence of intracranial metastatic disease.   Original Report Authenticated By: Judie Petit. Miles Costain, M.D.   Ct Chest W Contrast  04/04/2013   *RADIOLOGY REPORT*  Clinical Data: Palpitations and dyspnea  CT CHEST WITH CONTRAST  Technique:  Multidetector CT imaging of the chest was performed following the standard protocol during bolus administration of intravenous contrast.  Contrast: 80mL OMNIPAQUE IOHEXOL 300 MG/ML  SOLN  Comparison: None.  Findings: There is no pleural effusion identified.  Severe changes of  COPD/emphysema identified.  There is a large central obstructing mass occluding the right lower lobe airway.  This measures approximately 5.6 x 6.7 x 6.0 cm.  Central areas of low attenuation are noted suggestive of necrosis.   Endobronchial extension and occlusion of this tumor is noted, image 34/series 3. There is postobstructive consolidation of the much of the left lower lobe.  Spiculated right upper lobe perihilar lesion measures 1.9 cm. There is a right upper lobe spiculated lesion measuring 1.1 cm, image 31/series 3.  Normal heart size.  No pericardial effusion.  The subcarinal lymph node is enlarged measuring 2.5 cm, image  33/series 2.  Limited imaging through the upper abdomen shows an indeterminant low attenuation lesion in the left hepatic lobe measuring 1.9 cm, image 60/series 2.  Within the right hepatic lobe there is an indeterminate lesion measuring 2.3 cm, image 56/series 2.  Review of the visualized bony structures shows no aggressive lytic or sclerotic bone lesions.  IMPRESSION:  1.  Central obstructing mass within the left lower lobe is present. There are several, suspicious spiculated lesions within the right lung which are concerning for metastases.  If this is a non-small cell lung cancer then this would be considered at least a [id number]. 2.  Indeterminant lesions within the liver.  Consider further evaluation with PET CT.   Original Report Authenticated By: Signa Kell, M.D.   Ct Abdomen Pelvis W Contrast  04/09/2013   *RADIOLOGY REPORT*  Clinical Data: Possible lung cancer.  Liver lesions seen on recent chest CT 04/04/2013  CT ABDOMEN AND PELVIS WITH CONTRAST  Technique:  Multidetector CT imaging of the abdomen and pelvis was performed following the standard protocol during bolus administration of intravenous contrast.  Contrast: OMNIPAQUE IOHEXOL 300 MG/ML  SOLN, 50mL OMNIPAQUE IOHEXOL 300 MG/ML  SOLN  Comparison: Chest CT 04/04/2013  Findings:  Lung bases:  Again seen is a dense consolidation throughout the visualized portion of the left lower lobe.  This is likely due to a central obstructing tumor as described on recent chest CT.  There is now a small left pleural effusion that appears new compared to recent CT.  In the there is new patchy airspace disease in the periphery of the right lower lobe near the right hemidiaphragm.  In the no definite pleural effusion on the right.  Mild cardiomegaly with coronary artery atherosclerotic calcifications.   In the left lobe of the liver is approximately 2 cm irregularly shaped lesion with Hounsfield units of approximately 26.  This appears similar to prior chest  CT.  In the posterior right lobe of the liver is a 2.4 cm hypodense lesion with Hounsfield units of 32. Hepatic metastases cannot be excluded.  PET CT could be performed for further characterization.  In the medial spleen is a 2.0 cm hypodense lesion that measures 39 HU.  This is seen to better advantage on today's CT given differences in timing relative to contrast administration. A splenic metastasis cannot be excluded.  There are no prior images the earlier than June 2014 of the abdomen for comparison over time. The spleen is normal in size.  There is a paucity of intra-abdominal fat, which limits the evaluation for acute inflammatory changes.  The gallbladder is decompressed and unremarkable.  There is slight thickening of the limbs of the left adrenal gland, but no discrete adrenal nodule or mass is seen.  The right adrenal gland appears normal.  The pancreas and kidneys are within normal limits.  Bowel loops are normal in caliber.  The urinary bladder is moderately distended and the anterior bladder deviates toward the right of midline.  On image #74 of the axial images, there is a fluid-filled structure with peripheral enhancement in the right inguinal canal that is favored to be herniation of a small portion of the urinary bladder into the inguinal hernia.  All of the small bowel loops in the abdomen and pelvis are well opacified with oral contrast.  No herniated bowel loops are seen.  The abdominal aorta is normal in caliber contains atherosclerotic calcification.  The prostate gland is enlarged and bulges upward into the bladder base.  No abdominal or pelvic lymphadenopathy is identified.  The imaged vertebral bodies are normal in height and alignment.  No suspicious osseous lesions are identified.  Negative for fracture.  IMPRESSION:  1.  Dense consolidation in the left lower lobe of the lung is likely due to postobstructive pneumonitis in this patient finding with findings suspicious for a central  obstructing malignancy in the left lower lobe as described on recent chest CT.  A small left pleural effusion is present, and is new. 2.  Two low-density lesions in the liver are suspicious for hepatic metastases.  These could be further evaluated with PET CT if desired. 3.  2.0 cm hypodense lesion in the medial spleen.  A splenic metastasis cannot be excluded.  Consider evaluation with PET CT. 4.  Probable herniation of a small portion of the anterior urinary bladder into the right inguinal hernia. 5.  Cardiomegaly and coronary artery atherosclerosis.   Original Report Authenticated By:  Britta Mccreedy, M.D.   Dg Chest Port 1 View  04/06/2013   *RADIOLOGY REPORT*  Clinical Data: Shortness of breath after transfusion.  PORTABLE CHEST - 1 VIEW  Comparison: 04/03/2013 and CT from 04/04/2013  Findings: Portable upright view of the chest demonstrates densities in the left lower chest.  There may also be left pleural fluid. Upper lungs remain clear. Heart size is grossly stable.  Again note is fullness in the left hilar region related to the known opacity or lesion.  IMPRESSION: Densities in the left mid and lower chest.  Findings are similar to the recent CT but there has been progression since 04/03/2013. Findings could represent postobstructive pneumonitis and/or volume loss.  There may be pleural fluid.   Original Report Authenticated By: Richarda Overlie, M.D.    ASSESSMENT AND PLAN: This is a very pleasant 79 years old African American male recently diagnosed with metastatic non-small cell lung cancer, squamous cell carcinoma with liver metastasis. The patient is currently undergoing palliative radiotherapy to the central obstructing left lower lobe lung mass. He is currently being treated with systemic chemotherapy in the form of carboplatin for an AUC of 5 and paclitaxel 175 mg per meter squared given every 3 weeks with Neulasta support, status post 1 cycle. He tolerated his first cycle of systemic chemotherapy  without significant difficulty. Patient was discussed with and seen by Dr. Arbutus Ped. He will continue with his weekly labs as scheduled and return in 2 weeks prior to the start of cycle #2 with a repeat CBC differential and C. met.  He was advised to call immediately if he has any concerning symptoms in the interval. All questions were answered. The patient knows to call the clinic with any problems, questions or concerns. We can certainly see the patient much sooner if necessary.  Conni Slipper PA-C  ADDENDUM: Hematology/Oncology Attending: I have face to face encounter with the patient today. I recommended his care plan. He was recently diagnosed with metastatic non-small cell lung cancer, squamous cell carcinoma. The patient was started on systemic chemotherapy with carboplatin and paclitaxel status post 1 week of treatment and he is tolerating it fairly well. He denied having any significant nausea or vomiting, no fever or chills. I recommended for the patient to come back for follow up visit in 2 weeks with the start of cycle #2. He was advised to call immediately if he has any concerning symptoms in the interval.   Lajuana Matte., MD 05/08/2013

## 2013-05-09 ENCOUNTER — Ambulatory Visit (INDEPENDENT_AMBULATORY_CARE_PROVIDER_SITE_OTHER): Payer: Medicare Other | Admitting: Pulmonary Disease

## 2013-05-09 ENCOUNTER — Telehealth: Payer: Self-pay | Admitting: Internal Medicine

## 2013-05-09 ENCOUNTER — Encounter: Payer: Self-pay | Admitting: Pulmonary Disease

## 2013-05-09 ENCOUNTER — Telehealth: Payer: Self-pay | Admitting: *Deleted

## 2013-05-09 DIAGNOSIS — C3490 Malignant neoplasm of unspecified part of unspecified bronchus or lung: Secondary | ICD-10-CM

## 2013-05-09 NOTE — Telephone Encounter (Signed)
Per staff message and POF I have scheduled appts.  JMW  

## 2013-05-09 NOTE — Assessment & Plan Note (Signed)
He had original pulmonary consultation placed prior to his recent hospitalization.  He had evaluation for lung mass in hospital, and then was diagnosed with lung cancer.  He is being treated by oncology and radiation oncology.  I have cancelled this visit, and returned his co-pay.

## 2013-05-09 NOTE — Telephone Encounter (Signed)
lvm for pt and and advised on July appts...mailed pt avs and letter

## 2013-05-09 NOTE — Progress Notes (Signed)
  Subjective:    Patient ID: Cameron Lam., male    DOB: 10/25/1933, 79 y.o.   MRN: [id number]  HPI    Review of Systems  Constitutional: Negative for fever and unexpected weight change.  HENT: Negative for ear pain, nosebleeds, congestion, sore throat, rhinorrhea, sneezing, trouble swallowing, dental problem, postnasal drip and sinus pressure.   Eyes: Negative for redness and itching.  Respiratory: Negative for cough, chest tightness, shortness of breath and wheezing.   Cardiovascular: Negative for palpitations and leg swelling.  Gastrointestinal: Negative for nausea and vomiting.  Genitourinary: Negative for dysuria.  Musculoskeletal: Negative for joint swelling.  Skin: Negative for rash.  Neurological: Negative for headaches.  Hematological: Does not bruise/bleed easily.  Psychiatric/Behavioral: Negative for dysphoric mood. The patient is not nervous/anxious.        Objective:   Physical Exam        Assessment & Plan:

## 2013-05-09 NOTE — Patient Instructions (Signed)
Follow up with pulmonary as needed 

## 2013-05-09 NOTE — Progress Notes (Signed)
Chief Complaint  Patient presents with  . Lung Mass    consult per Dr. Donald Siva.  breathing he stated is doing better.  not alot of coughing.  sleeps well.  eating well.  he does see Dr. Shirline Frees at the cancer center.     History of Present Illness: Cameron Lam. is a 79 y.o. male for evaluation of lung mass.  His appointment was made prior to his hospitalization.  He was seen in hospital by Dr. Billy Fischer, and had bronchoscope.  He was found to have lung cancer, and has been under therapy for his lung cancer with Dr. Arbutus Ped and Dr. Roselind Messier.  He is not having any respiratory symptoms.  He was unsure why he still needed this appointment, but kept the appointment anyway.  Tests:   Nadia Torr.  has a past medical history of Shingles outbreak (06/16/10); Hypertension; Tobacco abuse; and Lung cancer.  Ellin Mayhew Dustin Folks.  has past surgical history that includes Video bronchoscopy (Bilateral, 04/10/2013).  Prior to Admission medications   Medication Sig Start Date End Date Taking? Authorizing Provider  albuterol (PROVENTIL HFA;VENTOLIN HFA) 108 (90 BASE) MCG/ACT inhaler Inhale 2 puffs into the lungs every 6 (six) hours as needed for wheezing. 03/28/13  Yes Elvina Sidle, MD  amLODipine (NORVASC) 5 MG tablet Take 2 tablets (10 mg total) by mouth daily. 04/14/13  Yes Nishant Dhungel, MD  aspirin 81 MG tablet Take 1 tablet (81 mg total) by mouth daily. 12/11/11  Yes Dois Davenport, MD  feeding supplement (ENSURE COMPLETE) LIQD Take 237 mLs by mouth 2 (two) times daily between meals. 04/14/13  Yes Nishant Dhungel, MD  ferrous sulfate 325 (65 FE) MG tablet Take 1 tablet (325 mg total) by mouth 3 (three) times daily with meals. 04/14/13  Yes Nishant Dhungel, MD  Multiple Vitamin (MULTIVITAMIN) tablet Take 1 tablet by mouth daily.   Yes Historical Provider, MD  prochlorperazine (COMPAZINE) 10 MG tablet Take 1 tablet (10 mg total) by mouth every 6 (six) hours as needed. 04/28/13  Yes Si Gaul, MD    No Known Allergies  His family history is not on file.  He  reports that he quit smoking about 5 weeks ago. His smoking use included Cigarettes. He has a 90 pack-year smoking history. He does not have any smokeless tobacco history on file. He reports that he does not drink alcohol or use illicit drugs.   Physical exam not done.  CT CHEST WITH CONTRAST  Technique: Multidetector CT imaging of the chest was performed  following the standard protocol during bolus administration of  intravenous contrast.  Contrast: 80mL OMNIPAQUE IOHEXOL 300 MG/ML SOLN  Comparison: None.  Findings: There is no pleural effusion identified. Severe changes  of COPD/emphysema identified. There is a large central  obstructing mass occluding the right lower lobe airway. This  measures approximately 5.6 x 6.7 x 6.0 cm. Central areas of low  attenuation are noted suggestive of necrosis. Endobronchial  extension and occlusion of this tumor is noted, image 34/series 3.  There is postobstructive consolidation of the much of the left  lower lobe. Spiculated right upper lobe perihilar lesion measures  1.9 cm. There is a right upper lobe spiculated lesion measuring 1.1  cm, image 31/series 3.  Normal heart size. No pericardial effusion. The subcarinal lymph  node is enlarged measuring 2.5 cm, image 33/series 2.  Limited imaging through the upper abdomen shows an indeterminant  low attenuation lesion in the left  hepatic lobe measuring 1.9 cm,  image 60/series 2. Within the right hepatic lobe there is an  indeterminate lesion measuring 2.3 cm, image 56/series 2.  Review of the visualized bony structures shows no aggressive lytic  or sclerotic bone lesions.  IMPRESSION:  1. Central obstructing mass within the left lower lobe is present.  There are several, suspicious spiculated lesions within the right  lung which are concerning for metastases. If this is a non-small  cell lung cancer then this would  be considered at least a T2bN2M1a.  2. Indeterminant lesions within the liver. Consider further  evaluation with PET CT.   Lab Results  Component Value Date   WBC 39.4* 05/08/2013   HGB 9.7* 05/08/2013   HCT 31.4* 05/08/2013   MCV 73.8* 05/08/2013   PLT 217 05/08/2013    Lab Results  Component Value Date   CREATININE 0.8 05/08/2013   BUN 9.3 05/08/2013   NA 138 05/08/2013   K 3.9 05/08/2013   CL 100 04/13/2013   CO2 26 05/08/2013    Lab Results  Component Value Date   ALT 12 05/08/2013   AST 19 05/08/2013   ALKPHOS 145 05/08/2013   BILITOT <0.20 Repeated and Verified 05/08/2013    Lab Results  Component Value Date   TSH 1.549 04/05/2013    Assessment/Plan:  Coralyn Helling, MD [facility] Pulmonary/Critical Care/Sleep Pager:  [phone]

## 2013-05-14 ENCOUNTER — Encounter: Payer: Self-pay | Admitting: Radiation Oncology

## 2013-05-14 NOTE — Progress Notes (Signed)
  Radiation Oncology         [phone]) [phone] ________________________________  Name: Cameron Lam. MRN: [id number]  Date: 05/14/2013  DOB: [birth year]  End of Treatment Note  Diagnosis:   Stage IV non-small cell lung cancer     Indication for treatment:  Bronchial obstruction and postobstructive pneumonia       Radiation treatment dates:   June 26 through July 16  Site/dose:   Left lower lung area, 37.5 gray in 15 fractions  Beams/energy:   3 field technique using 6, 10, 15 MV photons  Narrative: The patient tolerated radiation treatment relatively well.   Overall his breathing improved throughout the course of treatment.  Plan: The patient has completed radiation treatment. The patient will return to radiation oncology clinic for routine followup in one month. I advised them to call or return sooner if they have any questions or concerns related to their recovery or treatment.  -----------------------------------  Billie Lade, PhD, MD

## 2013-05-15 ENCOUNTER — Other Ambulatory Visit (HOSPITAL_BASED_OUTPATIENT_CLINIC_OR_DEPARTMENT_OTHER): Payer: Medicare Other | Admitting: Lab

## 2013-05-15 DIAGNOSIS — C3490 Malignant neoplasm of unspecified part of unspecified bronchus or lung: Secondary | ICD-10-CM

## 2013-05-15 LAB — COMPREHENSIVE METABOLIC PANEL (CC13)
ALT: 13 U/L (ref 0–55)
Albumin: 3.3 g/dL — ABNORMAL LOW (ref 3.5–5.0)
CO2: 25 mEq/L (ref 22–29)
Calcium: 9.2 mg/dL (ref 8.4–10.4)
Chloride: 104 mEq/L (ref 98–109)
Sodium: 139 mEq/L (ref 136–145)
Total Protein: 6.5 g/dL (ref 6.4–8.3)

## 2013-05-15 LAB — CBC WITH DIFFERENTIAL/PLATELET
BASO%: 0.2 % (ref 0.0–2.0)
HCT: 32.2 % — ABNORMAL LOW (ref 38.4–49.9)
MCHC: 31 g/dL — ABNORMAL LOW (ref 32.0–36.0)
MONO#: 1 10*3/uL — ABNORMAL HIGH (ref 0.1–0.9)
NEUT#: 12.7 10*3/uL — ABNORMAL HIGH (ref 1.5–6.5)
RBC: 4.2 10*6/uL (ref 4.20–5.82)
WBC: 22.6 10*3/uL — ABNORMAL HIGH (ref 4.0–10.3)
lymph#: 8.8 10*3/uL — ABNORMAL HIGH (ref 0.9–3.3)

## 2013-05-22 ENCOUNTER — Encounter: Payer: Self-pay | Admitting: Physician Assistant

## 2013-05-22 ENCOUNTER — Ambulatory Visit (HOSPITAL_BASED_OUTPATIENT_CLINIC_OR_DEPARTMENT_OTHER): Payer: Medicare Other

## 2013-05-22 ENCOUNTER — Other Ambulatory Visit (HOSPITAL_BASED_OUTPATIENT_CLINIC_OR_DEPARTMENT_OTHER): Payer: Medicare Other | Admitting: Lab

## 2013-05-22 ENCOUNTER — Other Ambulatory Visit: Payer: Medicare Other | Admitting: Lab

## 2013-05-22 ENCOUNTER — Encounter: Payer: Self-pay | Admitting: Internal Medicine

## 2013-05-22 ENCOUNTER — Ambulatory Visit (HOSPITAL_BASED_OUTPATIENT_CLINIC_OR_DEPARTMENT_OTHER): Payer: Medicare Other | Admitting: Physician Assistant

## 2013-05-22 DIAGNOSIS — C3430 Malignant neoplasm of lower lobe, unspecified bronchus or lung: Secondary | ICD-10-CM

## 2013-05-22 DIAGNOSIS — C3492 Malignant neoplasm of unspecified part of left bronchus or lung: Secondary | ICD-10-CM

## 2013-05-22 DIAGNOSIS — C787 Secondary malignant neoplasm of liver and intrahepatic bile duct: Secondary | ICD-10-CM

## 2013-05-22 DIAGNOSIS — C3490 Malignant neoplasm of unspecified part of unspecified bronchus or lung: Secondary | ICD-10-CM

## 2013-05-22 DIAGNOSIS — Z5111 Encounter for antineoplastic chemotherapy: Secondary | ICD-10-CM

## 2013-05-22 LAB — COMPREHENSIVE METABOLIC PANEL (CC13)
ALT: 9 U/L (ref 0–55)
CO2: 25 mEq/L (ref 22–29)
Sodium: 137 mEq/L (ref 136–145)
Total Bilirubin: <0.2 mg/dL (ref 0.20–1.20)
Total Protein: 6.5 g/dL (ref 6.4–8.3)

## 2013-05-22 LAB — CBC WITH DIFFERENTIAL/PLATELET
BASO%: 0.6 % (ref 0.0–2.0)
LYMPH%: 36 % (ref 14.0–49.0)
MCHC: 31.2 g/dL — ABNORMAL LOW (ref 32.0–36.0)
MONO#: 1 10*3/uL — ABNORMAL HIGH (ref 0.1–0.9)
Platelets: 302 10*3/uL (ref 140–400)
RBC: 4.2 10*6/uL (ref 4.20–5.82)
WBC: 15.1 10*3/uL — ABNORMAL HIGH (ref 4.0–10.3)
lymph#: 5.5 10*3/uL — ABNORMAL HIGH (ref 0.9–3.3)

## 2013-05-22 LAB — TECHNOLOGIST REVIEW

## 2013-05-22 MED ORDER — DIPHENHYDRAMINE HCL 50 MG/ML IJ SOLN
50.0000 mg | Freq: Once | INTRAMUSCULAR | Status: AC
  Administered 2013-05-22: 50 mg via INTRAVENOUS

## 2013-05-22 MED ORDER — PACLITAXEL CHEMO INJECTION 300 MG/50ML
175.0000 mg/m2 | Freq: Once | INTRAVENOUS | Status: AC
  Administered 2013-05-22: 300 mg via INTRAVENOUS
  Filled 2013-05-22: qty 50

## 2013-05-22 MED ORDER — SODIUM CHLORIDE 0.9 % IV SOLN
Freq: Once | INTRAVENOUS | Status: AC
  Administered 2013-05-22: 11:00:00 via INTRAVENOUS

## 2013-05-22 MED ORDER — SODIUM CHLORIDE 0.9 % IV SOLN
470.0000 mg | Freq: Once | INTRAVENOUS | Status: AC
  Administered 2013-05-22: 470 mg via INTRAVENOUS
  Filled 2013-05-22: qty 47

## 2013-05-22 MED ORDER — DEXAMETHASONE SODIUM PHOSPHATE 20 MG/5ML IJ SOLN
20.0000 mg | Freq: Once | INTRAMUSCULAR | Status: AC
  Administered 2013-05-22: 20 mg via INTRAVENOUS

## 2013-05-22 MED ORDER — ONDANSETRON 16 MG/50ML IVPB (CHCC)
16.0000 mg | Freq: Once | INTRAVENOUS | Status: AC
  Administered 2013-05-22: 16 mg via INTRAVENOUS

## 2013-05-22 MED ORDER — FAMOTIDINE IN NACL 20-0.9 MG/50ML-% IV SOLN
20.0000 mg | Freq: Once | INTRAVENOUS | Status: AC
  Administered 2013-05-22: 20 mg via INTRAVENOUS

## 2013-05-22 NOTE — Progress Notes (Addendum)
[facility] [phone]   [phone]  SHARED VISIT PROGRESS NOTE  No PCP Per Patient [address] [place] [address]  DIAGNOSIS:  Metastatic non-small cell lung cancer, squamous cell carcinoma with large left lower lobe lung mass as well as liver metastasis diagnosed in June of 2014  PRIOR THERAPY: Palliative radiotherapy to the left lower lobe obstructing mass under the care of Dr. Roselind Messier  CURRENT THERAPY: Systemic chemotherapy with carboplatin for AUC of 5 and paclitaxel 175 mg/M2 every 3 weeks with Neulasta support. Status post 1 cycle  DISEASE STAGE: Stage IV  CHEMOTHERAPY INTENT: Palliative  CURRENT # OF CHEMOTHERAPY CYCLES: 1  CURRENT ANTIEMETICS: Zofran, dexamethasone, Compazine  CURRENT SMOKING STATUS: Former smoker, quit 05/14/2013  ORAL CHEMOTHERAPY AND CONSENT: n/a  CURRENT BISPHOSPHONATES USE:  none  PAIN MANAGEMENT: none   NARCOTICS INDUCED CONSTIPATION: none  LIVING WILL AND CODE STATUS:    INTERVAL HISTORY: Cameron Lam. 79 y.o. male returns to the clinic today for a follow up visit, accompanied by his daughter Theresa Duty. He reports that he tolerated his first cycle of systemic chemotherapy with carboplatin and paclitaxel with Neulasta support without significant difficulty. He did have some mild nausea without vomiting but this was well-controlled with his Compazine. He states that he is sleeping good and eating well. He continues to be able to keep up with the yard work. He is drinking 2-3 Ensure nutritional supplements daily as well as eating well in between. He voices no specific complaints today. He denies any fever, chills, diarrhea, constipation, cough, shortness of breath or hemoptysis.  The patient denied having any headache or visual changes. Has no significant weight loss or night sweats. He presents to proceed with cycle #2 of his systemic chemotherapy with carboplatin and paclitaxel with Neulasta  support.  MEDICAL HISTORY: Past Medical History  Diagnosis Date  . Shingles outbreak 06/16/10    Lt anterior and postior thigh/buttocks  . Hypertension   . Tobacco abuse   . Lung cancer     ALLERGIES:  has No Known Allergies.  MEDICATIONS:  Current Outpatient Prescriptions  Medication Sig Dispense Refill  . albuterol (PROVENTIL HFA;VENTOLIN HFA) 108 (90 BASE) MCG/ACT inhaler Inhale 2 puffs into the lungs every 6 (six) hours as needed for wheezing.  1 Inhaler  0  . amLODipine (NORVASC) 5 MG tablet Take 2 tablets (10 mg total) by mouth daily.  60 tablet  0  . aspirin 81 MG tablet Take 1 tablet (81 mg total) by mouth daily.  30 tablet  11  . feeding supplement (ENSURE COMPLETE) LIQD Take 237 mLs by mouth 2 (two) times daily between meals.  60 Bottle  0  . ferrous sulfate 325 (65 FE) MG tablet Take 1 tablet (325 mg total) by mouth 3 (three) times daily with meals.  90 tablet  3  . Multiple Vitamin (MULTIVITAMIN) tablet Take 1 tablet by mouth daily.      . prochlorperazine (COMPAZINE) 10 MG tablet Take 1 tablet (10 mg total) by mouth every 6 (six) hours as needed.  60 tablet  0   No current facility-administered medications for this visit.    SURGICAL HISTORY:  Past Surgical History  Procedure Laterality Date  . Video bronchoscopy Bilateral 04/10/2013    Procedure: VIDEO BRONCHOSCOPY WITH FLUORO;  Surgeon: Merwyn Katos, MD;  Location: [facility];  Service: Cardiopulmonary;  Laterality: Bilateral;    REVIEW OF SYSTEMS:  A comprehensive review of  systems was negative except for: Gastrointestinal: positive for nausea   PHYSICAL EXAMINATION: General appearance: alert, cooperative and no distress Head: Normocephalic, without obvious abnormality, atraumatic Neck: no adenopathy Lymph nodes: Cervical, supraclavicular, and axillary nodes normal. Resp: diminished breath sounds LLL Back: symmetric, no curvature. ROM normal. No CVA tenderness. Cardio: regular rate and rhythm, S1, S2  normal, no murmur, click, rub or gallop GI: soft, non-tender; bowel sounds normal; no masses,  no organomegaly Extremities: extremities normal, atraumatic, no cyanosis or edema Neurologic: Alert and oriented X 3, normal strength and tone. Normal symmetric reflexes. Normal coordination and gait  ECOG PERFORMANCE STATUS: 1 - Symptomatic but completely ambulatory  There were no vitals taken for this visit.  LABORATORY DATA: Lab Results  Component Value Date   WBC 15.1* 05/22/2013   HGB 10.2* 05/22/2013   HCT 32.6* 05/22/2013   MCV 77.5* 05/22/2013   PLT 302 05/22/2013      Chemistry      Component Value Date/Time   NA 139 05/15/2013 0945   NA 135 04/13/2013 0530   K 4.5 05/15/2013 0945   K 3.9 04/13/2013 0530   CL 100 04/13/2013 0530   CO2 25 05/15/2013 0945   CO2 27 04/13/2013 0530   BUN 10.9 05/15/2013 0945   BUN 11 04/13/2013 0530   CREATININE 0.7 05/15/2013 0945   CREATININE 0.88 04/13/2013 0530      Component Value Date/Time   CALCIUM 9.2 05/15/2013 0945   CALCIUM 8.9 04/13/2013 0530   ALKPHOS 128 05/15/2013 0945   ALKPHOS 78 04/04/2013 1303   AST 19 05/15/2013 0945   AST 13 04/04/2013 1303   ALT 13 05/15/2013 0945   ALT 9 04/04/2013 1303   BILITOT 0.27 05/15/2013 0945   BILITOT 0.3 04/04/2013 1303       RADIOGRAPHIC STUDIES:  Ct Chest W Contrast  04/04/2013   *RADIOLOGY REPORT*  Clinical Data: Palpitations and dyspnea  CT CHEST WITH CONTRAST  Technique:  Multidetector CT imaging of the chest was performed following the standard protocol during bolus administration of intravenous contrast.  Contrast: 80mL OMNIPAQUE IOHEXOL 300 MG/ML  SOLN  Comparison: None.  Findings: There is no pleural effusion identified.  Severe changes of  COPD/emphysema identified.  There is a large central obstructing mass occluding the right lower lobe airway.  This measures approximately 5.6 x 6.7 x 6.0 cm.  Central areas of low attenuation are noted suggestive of necrosis.   Endobronchial extension and occlusion  of this tumor is noted, image 34/series 3. There is postobstructive consolidation of the much of the left lower lobe.  Spiculated right upper lobe perihilar lesion measures 1.9 cm. There is a right upper lobe spiculated lesion measuring 1.1 cm, image 31/series 3.  Normal heart size.  No pericardial effusion.  The subcarinal lymph node is enlarged measuring 2.5 cm, image 33/series 2.  Limited imaging through the upper abdomen shows an indeterminant low attenuation lesion in the left hepatic lobe measuring 1.9 cm, image 60/series 2.  Within the right hepatic lobe there is an indeterminate lesion measuring 2.3 cm, image 56/series 2.  Review of the visualized bony structures shows no aggressive lytic or sclerotic bone lesions.  IMPRESSION:  1.  Central obstructing mass within the left lower lobe is present. There are several, suspicious spiculated lesions within the right lung which are concerning for metastases.  If this is a non-small cell lung cancer then this would be considered at least a T2bN2M1a. 2.  Indeterminant lesions within the liver.  Consider further evaluation with PET CT.   Original Report Authenticated By: Signa Kell, M.D.   Ct Abdomen Pelvis W Contrast  04/09/2013   *RADIOLOGY REPORT*  Clinical Data: Possible lung cancer.  Liver lesions seen on recent chest CT 04/04/2013  CT ABDOMEN AND PELVIS WITH CONTRAST  Technique:  Multidetector CT imaging of the abdomen and pelvis was performed following the standard protocol during bolus administration of intravenous contrast.  Contrast: OMNIPAQUE IOHEXOL 300 MG/ML  SOLN, 50mL OMNIPAQUE IOHEXOL 300 MG/ML  SOLN  Comparison: Chest CT 04/04/2013  Findings:  Lung bases:  Again seen is a dense consolidation throughout the visualized portion of the left lower lobe.  This is likely due to a central obstructing tumor as described on recent chest CT.  There is now a small left pleural effusion that appears new compared to recent CT.  In the there is new  patchy airspace disease in the periphery of the right lower lobe near the right hemidiaphragm.  In the no definite pleural effusion on the right.  Mild cardiomegaly with coronary artery atherosclerotic calcifications.   In the left lobe of the liver is approximately 2 cm irregularly shaped lesion with Hounsfield units of approximately 26.  This appears similar to prior chest CT.  In the posterior right lobe of the liver is a 2.4 cm hypodense lesion with Hounsfield units of 32. Hepatic metastases cannot be excluded.  PET CT could be performed for further characterization.  In the medial spleen is a 2.0 cm hypodense lesion that measures 39 HU.  This is seen to better advantage on today's CT given differences in timing relative to contrast administration. A splenic metastasis cannot be excluded.  There are no prior images the earlier than June 2014 of the abdomen for comparison over time. The spleen is normal in size.  There is a paucity of intra-abdominal fat, which limits the evaluation for acute inflammatory changes.  The gallbladder is decompressed and unremarkable.  There is slight thickening of the limbs of the left adrenal gland, but no discrete adrenal nodule or mass is seen.  The right adrenal gland appears normal.  The pancreas and kidneys are within normal limits.  Bowel loops are normal in caliber.  The urinary bladder is moderately distended and the anterior bladder deviates toward the right of midline.  On image #74 of the axial images, there is a fluid-filled structure with peripheral enhancement in the right inguinal canal that is favored to be herniation of a small portion of the urinary bladder into the inguinal hernia.  All of the small bowel loops in the abdomen and pelvis are well opacified with oral contrast.  No herniated bowel loops are seen.  The abdominal aorta is normal in caliber contains atherosclerotic calcification.  The prostate gland is enlarged and bulges upward into the bladder base.   No abdominal or pelvic lymphadenopathy is identified.  The imaged vertebral bodies are normal in height and alignment.  No suspicious osseous lesions are identified.  Negative for fracture.  IMPRESSION:  1.  Dense consolidation in the left lower lobe of the lung is likely due to postobstructive pneumonitis in this patient finding with findings suspicious for a central obstructing malignancy in the left lower lobe as described on recent chest CT.  A small left pleural effusion is present, and is new. 2.  Two low-density lesions in the liver are suspicious for hepatic metastases.  These could be further evaluated with PET CT if desired. 3.  2.0  cm hypodense lesion in the medial spleen.  A splenic metastasis cannot be excluded.  Consider evaluation with PET CT. 4.  Probable herniation of a small portion of the anterior urinary bladder into the right inguinal hernia. 5.  Cardiomegaly and coronary artery atherosclerosis.   Original Report Authenticated By: Britta Mccreedy, M.D.   Dg Chest Port 1 View  04/06/2013   *RADIOLOGY REPORT*  Clinical Data: Shortness of breath after transfusion.  PORTABLE CHEST - 1 VIEW  Comparison: 04/03/2013 and CT from 04/04/2013  Findings: Portable upright view of the chest demonstrates densities in the left lower chest.  There may also be left pleural fluid. Upper lungs remain clear. Heart size is grossly stable.  Again note is fullness in the left hilar region related to the known opacity or lesion.  IMPRESSION: Densities in the left mid and lower chest.  Findings are similar to the recent CT but there has been progression since 04/03/2013. Findings could represent postobstructive pneumonitis and/or volume loss.  There may be pleural fluid.   Original Report Authenticated By: Richarda Overlie, M.D.    ASSESSMENT AND PLAN: This is a very pleasant 79 years old African American male recently diagnosed with metastatic non-small cell lung cancer, squamous cell carcinoma with liver metastasis. The  patient is completed palliative radiotherapy to the central obstructing left lower lobe lung mass. He is currently being treated with systemic chemotherapy in the form of carboplatin for an AUC of 5 and paclitaxel 175 mg per meter squared given every 3 weeks with Neulasta support, status post 1 cycle. He tolerated his first cycle of systemic chemotherapy without significant difficulty. Patient was discussed with and seen by Dr. Arbutus Ped. He will proceed with cycle #2 of his systemic chemotherapy with carboplatin and paclitaxel with Neulasta support as scheduled today. He'll continue with weekly labs consisting of a CBC differential and C. met. He will followup in 3 weeks for another symptom management visit prior to the start of his next scheduled cycle of chemotherapy.  He was advised to call immediately if he has any concerning symptoms in the interval. All questions were answered. The patient knows to call the clinic with any problems, questions or concerns. We can certainly see the patient much sooner if necessary.  Conni Slipper PA-C  ADDENDUM: Hematology/Oncology Attending: I have face to face encounter with the patient today. I recommended his care plan. He came to the clinic today accompanied by his daughter. The patient tolerated the first cycle of his systemic chemotherapy with carboplatin and paclitaxel fairly well with no significant adverse effects. He denied having any significant nausea or vomiting. He has no fever or chills. He has no peripheral neuropathy. I recommended for him to proceed with the second cycle of his treatment today as scheduled. The patient would come back for follow up visit in 3 weeks with the next cycle of his chemotherapy. Lajuana Matte., MD 05/22/2013

## 2013-05-22 NOTE — Patient Instructions (Addendum)
Continue weekly labs as scheduled Follow up in 3 weeks, prior to the start of your next scheduled cycle of chemotherapy 

## 2013-05-22 NOTE — Patient Instructions (Addendum)
[facility] Discharge Instructions for Patients Receiving Chemotherapy  Today you received the following chemotherapy agents: Taxol and Carboplatin.  To help prevent nausea and vomiting after your treatment, we encourage you to take your nausea medication as prescribed.   If you develop nausea and vomiting that is not controlled by your nausea medication, call the clinic.   BELOW ARE SYMPTOMS THAT SHOULD BE REPORTED IMMEDIATELY:  *FEVER GREATER THAN 100.5 F  *CHILLS WITH OR WITHOUT FEVER  NAUSEA AND VOMITING THAT IS NOT CONTROLLED WITH YOUR NAUSEA MEDICATION  *UNUSUAL SHORTNESS OF BREATH  *UNUSUAL BRUISING OR BLEEDING  TENDERNESS IN MOUTH AND THROAT WITH OR WITHOUT PRESENCE OF ULCERS  *URINARY PROBLEMS  *BOWEL PROBLEMS  UNUSUAL RASH Items with * indicate a potential emergency and should be followed up as soon as possible.  Feel free to call the clinic you have any questions or concerns. The clinic phone number is [phone].    

## 2013-05-23 ENCOUNTER — Ambulatory Visit (HOSPITAL_BASED_OUTPATIENT_CLINIC_OR_DEPARTMENT_OTHER): Payer: Medicare Other

## 2013-05-23 VITALS — BP 142/81 | HR 88 | Temp 97.3°F | Resp 18

## 2013-05-23 DIAGNOSIS — C3492 Malignant neoplasm of unspecified part of left bronchus or lung: Secondary | ICD-10-CM

## 2013-05-23 DIAGNOSIS — C3430 Malignant neoplasm of lower lobe, unspecified bronchus or lung: Secondary | ICD-10-CM

## 2013-05-23 MED ORDER — PEGFILGRASTIM INJECTION 6 MG/0.6ML
6.0000 mg | Freq: Once | SUBCUTANEOUS | Status: AC
  Administered 2013-05-23: 6 mg via SUBCUTANEOUS
  Filled 2013-05-23: qty 0.6

## 2013-05-23 NOTE — Patient Instructions (Addendum)

## 2013-05-26 ENCOUNTER — Telehealth: Payer: Self-pay | Admitting: Internal Medicine

## 2013-05-28 ENCOUNTER — Other Ambulatory Visit: Payer: Self-pay

## 2013-05-29 ENCOUNTER — Other Ambulatory Visit (HOSPITAL_BASED_OUTPATIENT_CLINIC_OR_DEPARTMENT_OTHER): Payer: Medicare Other

## 2013-05-29 ENCOUNTER — Telehealth: Payer: Self-pay | Admitting: Internal Medicine

## 2013-05-29 DIAGNOSIS — C3430 Malignant neoplasm of lower lobe, unspecified bronchus or lung: Secondary | ICD-10-CM

## 2013-05-29 DIAGNOSIS — C3490 Malignant neoplasm of unspecified part of unspecified bronchus or lung: Secondary | ICD-10-CM

## 2013-05-29 LAB — COMPREHENSIVE METABOLIC PANEL (CC13)
ALT: 11 U/L (ref 0–55)
Alkaline Phosphatase: 153 U/L — ABNORMAL HIGH (ref 40–150)
Glucose: 81 mg/dl (ref 70–140)
Sodium: 139 mEq/L (ref 136–145)
Total Bilirubin: 0.22 mg/dL (ref 0.20–1.20)
Total Protein: 6.2 g/dL — ABNORMAL LOW (ref 6.4–8.3)

## 2013-05-29 LAB — CBC WITH DIFFERENTIAL/PLATELET
EOS%: 0.3 % (ref 0.0–7.0)
MCH: 24.1 pg — ABNORMAL LOW (ref 27.2–33.4)
MCV: 78.6 fL — ABNORMAL LOW (ref 79.3–98.0)
MONO%: 5.2 % (ref 0.0–14.0)
RBC: 3.69 10*6/uL — ABNORMAL LOW (ref 4.20–5.82)
RDW: 28 % — ABNORMAL HIGH (ref 11.0–14.6)
nRBC: 0 % (ref 0–0)

## 2013-05-29 NOTE — Telephone Encounter (Signed)
pt called to get sched mailed.Marland KitchenMarland KitchenMarland KitchenDone mailed avs/letter and appt sched

## 2013-05-30 ENCOUNTER — Telehealth: Payer: Self-pay | Admitting: Nutrition

## 2013-05-30 NOTE — Telephone Encounter (Signed)
Received message from patient's daughter that they wanted a nutrition appointment for patient.  I have called her back twice to assist her and left 2 messages.  She has my phone number if she wishes to call and make an appointment.

## 2013-06-05 ENCOUNTER — Other Ambulatory Visit (HOSPITAL_BASED_OUTPATIENT_CLINIC_OR_DEPARTMENT_OTHER): Payer: Medicare Other | Admitting: Lab

## 2013-06-05 ENCOUNTER — Encounter: Payer: Self-pay | Admitting: *Deleted

## 2013-06-05 DIAGNOSIS — C3430 Malignant neoplasm of lower lobe, unspecified bronchus or lung: Secondary | ICD-10-CM

## 2013-06-05 DIAGNOSIS — C3490 Malignant neoplasm of unspecified part of unspecified bronchus or lung: Secondary | ICD-10-CM

## 2013-06-05 LAB — COMPREHENSIVE METABOLIC PANEL (CC13)
Alkaline Phosphatase: 148 U/L (ref 40–150)
BUN: 9.8 mg/dL (ref 7.0–26.0)
Creatinine: 0.8 mg/dL (ref 0.7–1.3)
Glucose: 93 mg/dl (ref 70–140)
Sodium: 141 mEq/L (ref 136–145)
Total Bilirubin: 0.21 mg/dL (ref 0.20–1.20)

## 2013-06-05 LAB — CBC WITH DIFFERENTIAL/PLATELET
Eosinophils Absolute: 0.1 10*3/uL (ref 0.0–0.5)
LYMPH%: 42.7 % (ref 14.0–49.0)
MCV: 80.3 fL (ref 79.3–98.0)
MONO%: 4.3 % (ref 0.0–14.0)
NEUT#: 13.5 10*3/uL — ABNORMAL HIGH (ref 1.5–6.5)
NEUT%: 52.1 % (ref 39.0–75.0)
Platelets: 403 10*3/uL — ABNORMAL HIGH (ref 140–400)
RBC: 3.88 10*6/uL — ABNORMAL LOW (ref 4.20–5.82)

## 2013-06-05 NOTE — Progress Notes (Signed)
[facility] Advance Directives Clinical Social Work  Clinical Social Work was referred by patient's daughter to review and complete healthcare advance directives.  Clinical Social Worker met with patient and patient's daughter in CSW office.  The patient designated [name], patient daughter, as their primary healthcare agent and [name], patient's daughter, as their secondary agent.  Patient also completed healthcare living will.  Cameron Lam indicated he does not desire life prolonging measures in scenarios indicated in living will. To review advance directives go to Chart Review>Media Tab>select Advance Directives.  Clinical Social Worker notarized documents and made copies for patient/family. Clinical Social Worker will send documents to medical records to be scanned into patient's chart. Clinical Social Worker encouraged patient/family to contact with any additional questions or concerns.  Kathrin Penner, MSW, LCSW Clinical Social Worker [facility] [phone]

## 2013-06-06 ENCOUNTER — Encounter: Payer: Self-pay | Admitting: Radiation Oncology

## 2013-06-06 DIAGNOSIS — Z923 Personal history of irradiation: Secondary | ICD-10-CM | POA: Insufficient documentation

## 2013-06-09 ENCOUNTER — Ambulatory Visit
Admission: RE | Admit: 2013-06-09 | Discharge: 2013-06-09 | Disposition: A | Discharge status: Home / Self Care | Payer: Medicare Other | Source: Ambulatory Visit | Attending: Radiation Oncology | Admitting: Radiation Oncology

## 2013-06-09 ENCOUNTER — Encounter: Payer: Self-pay | Admitting: Radiation Oncology

## 2013-06-09 VITALS — BP 126/73 | HR 85 | Temp 98.2°F | Resp 20 | Wt 134.4 lb

## 2013-06-09 DIAGNOSIS — C3492 Malignant neoplasm of unspecified part of left bronchus or lung: Secondary | ICD-10-CM

## 2013-06-09 NOTE — Progress Notes (Signed)
  Radiation Oncology         [phone]) [phone] ________________________________  Name: Cameron Lam. MRN: [id number]  Date: 06/09/2013  DOB: 06/11/1934  Follow-Up Visit Note  CC: No PCP Per Patient  Cameron Packer, MD  Diagnosis:   Stage IV non-small cell lung cancer  Interval Since Last Radiation:  5  weeks  Narrative:  The patient returns today for routine follow-up.  He is doing well and without complaints. He specifically denies any pain within the chest area,  breathing problems cough or hemoptysis. He denies any swallowing problems. He denies any new bony pain or headaches. Patient continues on chemotherapy and is tolerating this well.                              ALLERGIES:  has No Known Allergies.  Meds: Current Outpatient Prescriptions  Medication Sig Dispense Refill  . albuterol (PROVENTIL HFA;VENTOLIN HFA) 108 (90 BASE) MCG/ACT inhaler Inhale 2 puffs into the lungs every 6 (six) hours as needed for wheezing.  1 Inhaler  0  . amLODipine (NORVASC) 5 MG tablet Take 2 tablets (10 mg total) by mouth daily.  60 tablet  0  . aspirin 81 MG tablet Take 1 tablet (81 mg total) by mouth daily.  30 tablet  11  . feeding supplement (ENSURE COMPLETE) LIQD Take 237 mLs by mouth 2 (two) times daily between meals.  60 Bottle  0  . ferrous sulfate 325 (65 FE) MG tablet Take 1 tablet (325 mg total) by mouth 3 (three) times daily with meals.  90 tablet  3  . Multiple Vitamin (MULTIVITAMIN) tablet Take 1 tablet by mouth daily.      . prochlorperazine (COMPAZINE) 10 MG tablet Take 1 tablet (10 mg total) by mouth every 6 (six) hours as needed.  60 tablet  0   No current facility-administered medications for this encounter.    Physical Findings: The patient is in no acute distress. Patient is alert and oriented.  weight is 134 lb 6.4 oz (60.963 kg). His temperature is 98.2 F (36.8 C). His blood pressure is 126/73 and his pulse is 85. His respiration is 20 and oxygen saturation is 94%. .   No  palpable supraclavicular or axillary adenopathy. The lungs are clear to auscultation. The heart has a regular rhythm and rate.  Lab Findings: Lab Results  Component Value Date   WBC 26.0* 06/05/2013   HGB 9.8* 06/05/2013   HCT 31.2* 06/05/2013   MCV 80.3 06/05/2013   PLT 403* 06/05/2013      Radiographic Findings: No results found.  Impression:  The patient is recovering from the effects of radiation.  He is tolerating chemotherapy well  Plan:  When necessary followup in radiation oncology. Patient will continue close followup in medical oncology.  _____________________________________  -----------------------------------  Billie Lade, PhD, MD

## 2013-06-09 NOTE — Progress Notes (Signed)
Pt denies pain, cough, SOB, fatigue, loss of appetite.

## 2013-06-12 ENCOUNTER — Ambulatory Visit (HOSPITAL_BASED_OUTPATIENT_CLINIC_OR_DEPARTMENT_OTHER): Payer: Medicare Other | Admitting: Internal Medicine

## 2013-06-12 ENCOUNTER — Other Ambulatory Visit: Payer: Medicare Other | Admitting: Lab

## 2013-06-12 ENCOUNTER — Ambulatory Visit (HOSPITAL_BASED_OUTPATIENT_CLINIC_OR_DEPARTMENT_OTHER): Payer: Medicare Other

## 2013-06-12 ENCOUNTER — Encounter: Payer: Self-pay | Admitting: Internal Medicine

## 2013-06-12 ENCOUNTER — Other Ambulatory Visit (HOSPITAL_BASED_OUTPATIENT_CLINIC_OR_DEPARTMENT_OTHER): Payer: Medicare Other | Admitting: Lab

## 2013-06-12 DIAGNOSIS — C3430 Malignant neoplasm of lower lobe, unspecified bronchus or lung: Secondary | ICD-10-CM

## 2013-06-12 DIAGNOSIS — C3492 Malignant neoplasm of unspecified part of left bronchus or lung: Secondary | ICD-10-CM

## 2013-06-12 DIAGNOSIS — C787 Secondary malignant neoplasm of liver and intrahepatic bile duct: Secondary | ICD-10-CM

## 2013-06-12 DIAGNOSIS — Z5111 Encounter for antineoplastic chemotherapy: Secondary | ICD-10-CM

## 2013-06-12 DIAGNOSIS — C3490 Malignant neoplasm of unspecified part of unspecified bronchus or lung: Secondary | ICD-10-CM

## 2013-06-12 LAB — COMPREHENSIVE METABOLIC PANEL (CC13)
ALT: 14 U/L (ref 0–55)
AST: 19 U/L (ref 5–34)
Albumin: 3.5 g/dL (ref 3.5–5.0)
Calcium: 9.1 mg/dL (ref 8.4–10.4)
Chloride: 105 mEq/L (ref 98–109)
Creatinine: 0.7 mg/dL (ref 0.7–1.3)
Potassium: 4.3 mEq/L (ref 3.5–5.1)
Sodium: 140 mEq/L (ref 136–145)
Total Protein: 6.6 g/dL (ref 6.4–8.3)

## 2013-06-12 LAB — CBC WITH DIFFERENTIAL/PLATELET
Basophils Absolute: 0.1 10*3/uL (ref 0.0–0.1)
Eosinophils Absolute: 0 10*3/uL (ref 0.0–0.5)
HCT: 31.5 % — ABNORMAL LOW (ref 38.4–49.9)
HGB: 9.7 g/dL — ABNORMAL LOW (ref 13.0–17.1)
LYMPH%: 54 % — ABNORMAL HIGH (ref 14.0–49.0)
MCV: 82 fL (ref 79.3–98.0)
MONO#: 1.2 10*3/uL — ABNORMAL HIGH (ref 0.1–0.9)
MONO%: 6.6 % (ref 0.0–14.0)
NEUT#: 6.7 10*3/uL — ABNORMAL HIGH (ref 1.5–6.5)
Platelets: 335 10*3/uL (ref 140–400)

## 2013-06-12 MED ORDER — DEXAMETHASONE SODIUM PHOSPHATE 20 MG/5ML IJ SOLN
20.0000 mg | Freq: Once | INTRAMUSCULAR | Status: AC
  Administered 2013-06-12: 20 mg via INTRAVENOUS

## 2013-06-12 MED ORDER — SODIUM CHLORIDE 0.9 % IV SOLN
470.0000 mg | Freq: Once | INTRAVENOUS | Status: AC
  Administered 2013-06-12: 470 mg via INTRAVENOUS
  Filled 2013-06-12: qty 47

## 2013-06-12 MED ORDER — FAMOTIDINE IN NACL 20-0.9 MG/50ML-% IV SOLN
20.0000 mg | Freq: Once | INTRAVENOUS | Status: AC
  Administered 2013-06-12: 20 mg via INTRAVENOUS

## 2013-06-12 MED ORDER — SODIUM CHLORIDE 0.9 % IV SOLN
Freq: Once | INTRAVENOUS | Status: AC
  Administered 2013-06-12: 13:00:00 via INTRAVENOUS

## 2013-06-12 MED ORDER — PACLITAXEL CHEMO INJECTION 300 MG/50ML
175.0000 mg/m2 | Freq: Once | INTRAVENOUS | Status: AC
  Administered 2013-06-12: 300 mg via INTRAVENOUS
  Filled 2013-06-12: qty 50

## 2013-06-12 MED ORDER — DIPHENHYDRAMINE HCL 50 MG/ML IJ SOLN
50.0000 mg | Freq: Once | INTRAMUSCULAR | Status: AC
  Administered 2013-06-12: 50 mg via INTRAVENOUS

## 2013-06-12 MED ORDER — ONDANSETRON 16 MG/50ML IVPB (CHCC)
16.0000 mg | Freq: Once | INTRAVENOUS | Status: AC
  Administered 2013-06-12: 16 mg via INTRAVENOUS

## 2013-06-12 NOTE — Progress Notes (Signed)
Patient complains of slight burning at the IV site. Good blood return noted, burning subsides when Taxol stopped and normal saline free dripping. Heat applied to IV as well as normal saline flushing alongside of the Taxol. Patient states burning has subsided.  Pharmacy stated they will dilute the Taxol further for his next treatment.

## 2013-06-12 NOTE — Progress Notes (Signed)
[facility] Telephone:([phone]   Fax:(336) [phone]  OFFICE PROGRESS NOTE  No PCP Per Patient [address] [place] [address]  DIAGNOSIS: Metastatic non-small cell lung cancer, squamous cell carcinoma with large left lower lobe lung mass as well as liver metastasis diagnosed in June of 2014   PRIOR THERAPY: Palliative radiotherapy to the left lower lobe obstructing mass under the care of Dr. Roselind Messier   CURRENT THERAPY: Systemic chemotherapy with carboplatin for AUC of 5 and paclitaxel 175 mg/M2 every 3 weeks with Neulasta support. Status post 2 cycles.  DISEASE STAGE: Stage IV  CHEMOTHERAPY INTENT: Palliative  CURRENT # OF CHEMOTHERAPY CYCLES: 1  CURRENT ANTIEMETICS: Zofran, dexamethasone, Compazine  CURRENT SMOKING STATUS: Former smoker, quit 05/14/2013  ORAL CHEMOTHERAPY AND CONSENT: n/a  CURRENT BISPHOSPHONATES USE: none  PAIN MANAGEMENT: none  NARCOTICS INDUCED CONSTIPATION: none  LIVING WILL AND CODE STATUS: no CODE BLUE   INTERVAL HISTORY: Cameron Lam. 79 y.o. male returns to the clinic today for followup visit accompanied by his daughter. The patient is feeling fine today with no specific complaints. He is tolerating his systemic chemotherapy with carboplatin and paclitaxel fairly well with no significant adverse effects. The patient denied having any fever or chills. He denied having any nausea or vomiting. He has no chest pain, shortness breath, cough or hemoptysis. He is here today to start cycle #3 of his chemotherapy.  MEDICAL HISTORY: Past Medical History  Diagnosis Date  . Shingles outbreak 06/16/10    Lt anterior and postior thigh/buttocks  . Hypertension   . Tobacco abuse   . Lung cancer   . Hx of radiation therapy 6/26 - 05/07/13    LLL lung 37.5 gray in 15 fractions    ALLERGIES:  has No Known Allergies.  MEDICATIONS:  Current Outpatient Prescriptions  Medication Sig Dispense Refill  . albuterol (PROVENTIL HFA;VENTOLIN  HFA) 108 (90 BASE) MCG/ACT inhaler Inhale 2 puffs into the lungs every 6 (six) hours as needed for wheezing.  1 Inhaler  0  . amLODipine (NORVASC) 5 MG tablet Take 2 tablets (10 mg total) by mouth daily.  60 tablet  0  . aspirin 81 MG tablet Take 1 tablet (81 mg total) by mouth daily.  30 tablet  11  . feeding supplement (ENSURE COMPLETE) LIQD Take 237 mLs by mouth 2 (two) times daily between meals.  60 Bottle  0  . ferrous sulfate 325 (65 FE) MG tablet Take 1 tablet (325 mg total) by mouth 3 (three) times daily with meals.  90 tablet  3  . Multiple Vitamin (MULTIVITAMIN) tablet Take 1 tablet by mouth daily.      . prochlorperazine (COMPAZINE) 10 MG tablet Take 1 tablet (10 mg total) by mouth every 6 (six) hours as needed.  60 tablet  0   No current facility-administered medications for this visit.    SURGICAL HISTORY:  Past Surgical History  Procedure Laterality Date  . Video bronchoscopy Bilateral 04/10/2013    Procedure: VIDEO BRONCHOSCOPY WITH FLUORO;  Surgeon: Merwyn Katos, MD;  Location: [facility];  Service: Cardiopulmonary;  Laterality: Bilateral;    REVIEW OF SYSTEMS:  A comprehensive review of systems was negative.   PHYSICAL EXAMINATION: General appearance: alert, cooperative and no distress Head: Normocephalic, without obvious abnormality, atraumatic Neck: no adenopathy Lymph nodes: Cervical, supraclavicular, and axillary nodes normal. Resp: clear to auscultation bilaterally Cardio: regular rate and rhythm, S1, S2 normal, no murmur, click, rub or gallop  GI: soft, non-tender; bowel sounds normal; no masses,  no organomegaly Extremities: extremities normal, atraumatic, no cyanosis or edema  ECOG PERFORMANCE STATUS: 1 - Symptomatic but completely ambulatory  Blood pressure 120/67, pulse 88, temperature 98.1 F (36.7 C), temperature source Oral, resp. rate 18, height 5\' 7"  (1.702 m), weight 134 lb 8 oz (61.009 kg).  LABORATORY DATA: Lab Results  Component Value Date    WBC 17.5* 06/12/2013   HGB 9.7* 06/12/2013   HCT 31.5* 06/12/2013   MCV 82.0 06/12/2013   PLT 335 06/12/2013      Chemistry      Component Value Date/Time   NA 141 06/05/2013 0847   NA 135 04/13/2013 0530   K 4.4 06/05/2013 0847   K 3.9 04/13/2013 0530   CL 100 04/13/2013 0530   CO2 24 06/05/2013 0847   CO2 27 04/13/2013 0530   BUN 9.8 06/05/2013 0847   BUN 11 04/13/2013 0530   CREATININE 0.8 06/05/2013 0847   CREATININE 0.88 04/13/2013 0530      Component Value Date/Time   CALCIUM 9.2 06/05/2013 0847   CALCIUM 8.9 04/13/2013 0530   ALKPHOS 148 06/05/2013 0847   ALKPHOS 78 04/04/2013 1303   AST 19 06/05/2013 0847   AST 13 04/04/2013 1303   ALT 9 06/05/2013 0847   ALT 9 04/04/2013 1303   BILITOT 0.21 06/05/2013 0847   BILITOT 0.3 04/04/2013 1303       RADIOGRAPHIC STUDIES: No results found.  ASSESSMENT AND PLAN: this is a very pleasant 79 years old African American male with metastatic non-small cell lung cancer, squamous cell carcinoma currently undergoing systemic chemotherapy with carboplatin and paclitaxel is status post 2 cycles. The patient is tolerating his treatment fairly well with no significant adverse effects. I recommended for him to proceed with cycle #3 today as scheduled. The patient would come back for follow up visit in 3 weeks with repeat CT scan of the chest, abdomen and pelvis for restaging of his disease. He was advised to call immediately if he has any concerning symptoms in the interval.  The patient voices understanding of current disease status and treatment options and is in agreement with the current care plan.  All questions were answered. The patient knows to call the clinic with any problems, questions or concerns. We can certainly see the patient much sooner if necessary.

## 2013-06-12 NOTE — Patient Instructions (Signed)
[facility] Discharge Instructions for Patients Receiving Chemotherapy  Today you received the following chemotherapy agents Taxol/Carboplatin To help prevent nausea and vomiting after your treatment, we encourage you to take your nausea medication as prescribed.  If you develop nausea and vomiting that is not controlled by your nausea medication, call the clinic.   BELOW ARE SYMPTOMS THAT SHOULD BE REPORTED IMMEDIATELY:  *FEVER GREATER THAN 100.5 F  *CHILLS WITH OR WITHOUT FEVER  NAUSEA AND VOMITING THAT IS NOT CONTROLLED WITH YOUR NAUSEA MEDICATION  *UNUSUAL SHORTNESS OF BREATH  *UNUSUAL BRUISING OR BLEEDING  TENDERNESS IN MOUTH AND THROAT WITH OR WITHOUT PRESENCE OF ULCERS  *URINARY PROBLEMS  *BOWEL PROBLEMS  UNUSUAL RASH Items with * indicate a potential emergency and should be followed up as soon as possible.  Feel free to call the clinic you have any questions or concerns. The clinic phone number is [phone].    

## 2013-06-13 ENCOUNTER — Ambulatory Visit (HOSPITAL_BASED_OUTPATIENT_CLINIC_OR_DEPARTMENT_OTHER): Payer: Medicare Other

## 2013-06-13 VITALS — BP 114/71 | HR 97 | Temp 98.0°F

## 2013-06-13 DIAGNOSIS — C787 Secondary malignant neoplasm of liver and intrahepatic bile duct: Secondary | ICD-10-CM

## 2013-06-13 DIAGNOSIS — C3492 Malignant neoplasm of unspecified part of left bronchus or lung: Secondary | ICD-10-CM

## 2013-06-13 DIAGNOSIS — Z5189 Encounter for other specified aftercare: Secondary | ICD-10-CM

## 2013-06-13 DIAGNOSIS — C3430 Malignant neoplasm of lower lobe, unspecified bronchus or lung: Secondary | ICD-10-CM

## 2013-06-13 MED ORDER — PEGFILGRASTIM INJECTION 6 MG/0.6ML
6.0000 mg | Freq: Once | SUBCUTANEOUS | Status: AC
  Administered 2013-06-13: 6 mg via SUBCUTANEOUS
  Filled 2013-06-13: qty 0.6

## 2013-06-19 ENCOUNTER — Other Ambulatory Visit (HOSPITAL_BASED_OUTPATIENT_CLINIC_OR_DEPARTMENT_OTHER): Payer: Medicare Other

## 2013-06-19 DIAGNOSIS — C3490 Malignant neoplasm of unspecified part of unspecified bronchus or lung: Secondary | ICD-10-CM

## 2013-06-19 DIAGNOSIS — C3430 Malignant neoplasm of lower lobe, unspecified bronchus or lung: Secondary | ICD-10-CM

## 2013-06-19 LAB — CBC WITH DIFFERENTIAL/PLATELET
EOS%: 0.2 % (ref 0.0–7.0)
Eosinophils Absolute: 0.1 10*3/uL (ref 0.0–0.5)
MCV: 83.6 fL (ref 79.3–98.0)
MONO%: 5.2 % (ref 0.0–14.0)
NEUT#: 32.7 10*3/uL — ABNORMAL HIGH (ref 1.5–6.5)
RBC: 3.65 10*6/uL — ABNORMAL LOW (ref 4.20–5.82)
RDW: 21.2 % — ABNORMAL HIGH (ref 11.0–14.6)
lymph#: 12.2 10*3/uL — ABNORMAL HIGH (ref 0.9–3.3)

## 2013-06-19 LAB — COMPREHENSIVE METABOLIC PANEL (CC13)
AST: 30 U/L (ref 5–34)
Albumin: 3.6 g/dL (ref 3.5–5.0)
Alkaline Phosphatase: 183 U/L — ABNORMAL HIGH (ref 40–150)
Glucose: 81 mg/dl (ref 70–140)
Potassium: 3.9 mEq/L (ref 3.5–5.1)
Sodium: 141 mEq/L (ref 136–145)
Total Protein: 6.6 g/dL (ref 6.4–8.3)

## 2013-06-25 ENCOUNTER — Telehealth: Payer: Self-pay | Admitting: Internal Medicine

## 2013-06-25 NOTE — Telephone Encounter (Signed)
S/w pt confirming appt for 9/4 and asked that pt get new schedule when he comes in. Pt also aware ct moved from 9/4 to 9/9 but he will keep 9/4 lb. Per 8/21 pof ct to be 2 days prior to f/u.

## 2013-06-26 ENCOUNTER — Other Ambulatory Visit (HOSPITAL_BASED_OUTPATIENT_CLINIC_OR_DEPARTMENT_OTHER): Payer: Medicare Other

## 2013-06-26 ENCOUNTER — Ambulatory Visit (HOSPITAL_COMMUNITY): Payer: Medicare Other

## 2013-06-26 DIAGNOSIS — C3490 Malignant neoplasm of unspecified part of unspecified bronchus or lung: Secondary | ICD-10-CM

## 2013-06-26 LAB — COMPREHENSIVE METABOLIC PANEL
BUN: 10 mg/dL (ref 6–23)
CO2: 28 mEq/L (ref 19–32)
Calcium: 9 mg/dL (ref 8.4–10.5)
Chloride: 104 mEq/L (ref 96–112)
Creatinine, Ser: 0.78 mg/dL (ref 0.50–1.35)
Total Bilirubin: 0.1 mg/dL — ABNORMAL LOW (ref 0.3–1.2)

## 2013-06-26 LAB — CBC WITH DIFFERENTIAL/PLATELET
BASO%: 0.3 % (ref 0.0–2.0)
EOS%: 0.4 % (ref 0.0–7.0)
MCH: 26.1 pg — ABNORMAL LOW (ref 27.2–33.4)
MCV: 83.6 fL (ref 79.3–98.0)
MONO%: 5 % (ref 0.0–14.0)
RBC: 3.91 10*6/uL — ABNORMAL LOW (ref 4.20–5.82)
RDW: 20.4 % — ABNORMAL HIGH (ref 11.0–14.6)
nRBC: 0 % (ref 0–0)

## 2013-06-26 LAB — TECHNOLOGIST REVIEW

## 2013-06-30 ENCOUNTER — Ambulatory Visit (HOSPITAL_COMMUNITY)
Admission: RE | Admit: 2013-06-30 | Discharge: 2013-06-30 | Disposition: A | Discharge status: Home / Self Care | Payer: Medicare Other | Source: Ambulatory Visit | Attending: Internal Medicine | Admitting: Internal Medicine

## 2013-06-30 ENCOUNTER — Encounter (HOSPITAL_COMMUNITY): Payer: Self-pay

## 2013-06-30 DIAGNOSIS — K7689 Other specified diseases of liver: Secondary | ICD-10-CM | POA: Insufficient documentation

## 2013-06-30 DIAGNOSIS — R918 Other nonspecific abnormal finding of lung field: Secondary | ICD-10-CM | POA: Insufficient documentation

## 2013-06-30 DIAGNOSIS — D739 Disease of spleen, unspecified: Secondary | ICD-10-CM | POA: Insufficient documentation

## 2013-06-30 DIAGNOSIS — K4090 Unilateral inguinal hernia, without obstruction or gangrene, not specified as recurrent: Secondary | ICD-10-CM | POA: Insufficient documentation

## 2013-06-30 DIAGNOSIS — C3490 Malignant neoplasm of unspecified part of unspecified bronchus or lung: Secondary | ICD-10-CM | POA: Insufficient documentation

## 2013-06-30 DIAGNOSIS — Z79899 Other long term (current) drug therapy: Secondary | ICD-10-CM | POA: Insufficient documentation

## 2013-06-30 DIAGNOSIS — J439 Emphysema, unspecified: Secondary | ICD-10-CM | POA: Insufficient documentation

## 2013-06-30 DIAGNOSIS — R599 Enlarged lymph nodes, unspecified: Secondary | ICD-10-CM | POA: Insufficient documentation

## 2013-06-30 DIAGNOSIS — Z923 Personal history of irradiation: Secondary | ICD-10-CM | POA: Insufficient documentation

## 2013-06-30 MED ORDER — IOHEXOL 300 MG/ML  SOLN
100.0000 mL | Freq: Once | INTRAMUSCULAR | Status: AC | PRN
  Administered 2013-06-30: 100 mL via INTRAVENOUS

## 2013-07-01 ENCOUNTER — Ambulatory Visit (HOSPITAL_COMMUNITY): Payer: Medicare Other

## 2013-07-02 ENCOUNTER — Telehealth: Payer: Self-pay | Admitting: Internal Medicine

## 2013-07-02 NOTE — Telephone Encounter (Signed)
returned pt call and line was continuously busy

## 2013-07-03 ENCOUNTER — Ambulatory Visit (HOSPITAL_BASED_OUTPATIENT_CLINIC_OR_DEPARTMENT_OTHER): Payer: Medicare Other

## 2013-07-03 ENCOUNTER — Telehealth: Payer: Self-pay | Admitting: *Deleted

## 2013-07-03 ENCOUNTER — Ambulatory Visit (HOSPITAL_BASED_OUTPATIENT_CLINIC_OR_DEPARTMENT_OTHER): Payer: Medicare Other | Admitting: Internal Medicine

## 2013-07-03 ENCOUNTER — Ambulatory Visit: Payer: Medicare Other | Admitting: Nutrition

## 2013-07-03 ENCOUNTER — Other Ambulatory Visit (HOSPITAL_BASED_OUTPATIENT_CLINIC_OR_DEPARTMENT_OTHER): Payer: Medicare Other | Admitting: Lab

## 2013-07-03 VITALS — BP 130/79 | HR 85 | Temp 97.5°F | Resp 18 | Ht 67.0 in | Wt 134.0 lb

## 2013-07-03 DIAGNOSIS — C3492 Malignant neoplasm of unspecified part of left bronchus or lung: Secondary | ICD-10-CM

## 2013-07-03 DIAGNOSIS — C787 Secondary malignant neoplasm of liver and intrahepatic bile duct: Secondary | ICD-10-CM

## 2013-07-03 DIAGNOSIS — C3430 Malignant neoplasm of lower lobe, unspecified bronchus or lung: Secondary | ICD-10-CM

## 2013-07-03 DIAGNOSIS — Z5111 Encounter for antineoplastic chemotherapy: Secondary | ICD-10-CM

## 2013-07-03 DIAGNOSIS — C3490 Malignant neoplasm of unspecified part of unspecified bronchus or lung: Secondary | ICD-10-CM

## 2013-07-03 LAB — COMPREHENSIVE METABOLIC PANEL (CC13)
Albumin: 3.6 g/dL (ref 3.5–5.0)
Alkaline Phosphatase: 108 U/L (ref 40–150)
BUN: 11.4 mg/dL (ref 7.0–26.0)
CO2: 23 mEq/L (ref 22–29)
Calcium: 8.9 mg/dL (ref 8.4–10.4)
Chloride: 105 mEq/L (ref 98–109)
Glucose: 140 mg/dl (ref 70–140)
Potassium: 4.3 mEq/L (ref 3.5–5.1)
Sodium: 135 mEq/L — ABNORMAL LOW (ref 136–145)
Total Protein: 6.6 g/dL (ref 6.4–8.3)

## 2013-07-03 LAB — CBC WITH DIFFERENTIAL/PLATELET
Basophils Absolute: 0.1 10*3/uL (ref 0.0–0.1)
Eosinophils Absolute: 0.1 10*3/uL (ref 0.0–0.5)
HCT: 33.2 % — ABNORMAL LOW (ref 38.4–49.9)
HGB: 10.3 g/dL — ABNORMAL LOW (ref 13.0–17.1)
MONO#: 1.2 10*3/uL — ABNORMAL HIGH (ref 0.1–0.9)
NEUT#: 6.2 10*3/uL (ref 1.5–6.5)
NEUT%: 38.9 % — ABNORMAL LOW (ref 39.0–75.0)
RDW: 18.1 % — ABNORMAL HIGH (ref 11.0–14.6)
lymph#: 8.4 10*3/uL — ABNORMAL HIGH (ref 0.9–3.3)

## 2013-07-03 MED ORDER — DIPHENHYDRAMINE HCL 50 MG/ML IJ SOLN
50.0000 mg | Freq: Once | INTRAMUSCULAR | Status: AC
  Administered 2013-07-03: 50 mg via INTRAVENOUS

## 2013-07-03 MED ORDER — DEXAMETHASONE SODIUM PHOSPHATE 20 MG/5ML IJ SOLN
20.0000 mg | Freq: Once | INTRAMUSCULAR | Status: AC
  Administered 2013-07-03: 20 mg via INTRAVENOUS

## 2013-07-03 MED ORDER — DIPHENHYDRAMINE HCL 50 MG/ML IJ SOLN
INTRAMUSCULAR | Status: AC
  Administered 2013-07-03: 50 mg via INTRAVENOUS
  Filled 2013-07-03: qty 1

## 2013-07-03 MED ORDER — SODIUM CHLORIDE 0.9 % IV SOLN
200.0000 mg/m2 | Freq: Once | INTRAVENOUS | Status: AC
  Administered 2013-07-03: 342 mg via INTRAVENOUS
  Filled 2013-07-03: qty 57

## 2013-07-03 MED ORDER — SODIUM CHLORIDE 0.9 % IV SOLN
Freq: Once | INTRAVENOUS | Status: AC
  Administered 2013-07-03: 14:00:00 via INTRAVENOUS

## 2013-07-03 MED ORDER — ONDANSETRON 16 MG/50ML IVPB (CHCC)
INTRAVENOUS | Status: AC
  Administered 2013-07-03: 16 mg via INTRAVENOUS
  Filled 2013-07-03: qty 16

## 2013-07-03 MED ORDER — DEXAMETHASONE SODIUM PHOSPHATE 20 MG/5ML IJ SOLN
INTRAMUSCULAR | Status: AC
  Administered 2013-07-03: 20 mg via INTRAVENOUS
  Filled 2013-07-03: qty 5

## 2013-07-03 MED ORDER — FAMOTIDINE IN NACL 20-0.9 MG/50ML-% IV SOLN
INTRAVENOUS | Status: AC
  Filled 2013-07-03: qty 50

## 2013-07-03 MED ORDER — SODIUM CHLORIDE 0.9 % IV SOLN
550.0000 mg | Freq: Once | INTRAVENOUS | Status: AC
  Administered 2013-07-03: 550 mg via INTRAVENOUS
  Filled 2013-07-03: qty 55

## 2013-07-03 MED ORDER — ONDANSETRON 16 MG/50ML IVPB (CHCC)
16.0000 mg | Freq: Once | INTRAVENOUS | Status: AC
  Administered 2013-07-03: 16 mg via INTRAVENOUS

## 2013-07-03 MED ORDER — FAMOTIDINE IN NACL 20-0.9 MG/50ML-% IV SOLN
20.0000 mg | Freq: Once | INTRAVENOUS | Status: AC
  Administered 2013-07-03: 20 mg via INTRAVENOUS

## 2013-07-03 NOTE — Patient Instructions (Signed)
[facility] Discharge Instructions for Patients Receiving Chemotherapy  Today you received the following chemotherapy agents Taxol and Carboplatin.  To help prevent nausea and vomiting after your treatment, we encourage you to take your nausea medication.   If you develop nausea and vomiting that is not controlled by your nausea medication, call the clinic.   BELOW ARE SYMPTOMS THAT SHOULD BE REPORTED IMMEDIATELY:  *FEVER GREATER THAN 100.5 F  *CHILLS WITH OR WITHOUT FEVER  NAUSEA AND VOMITING THAT IS NOT CONTROLLED WITH YOUR NAUSEA MEDICATION  *UNUSUAL SHORTNESS OF BREATH  *UNUSUAL BRUISING OR BLEEDING  TENDERNESS IN MOUTH AND THROAT WITH OR WITHOUT PRESENCE OF ULCERS  *URINARY PROBLEMS  *BOWEL PROBLEMS  UNUSUAL RASH Items with * indicate a potential emergency and should be followed up as soon as possible.  Feel free to call the clinic you have any questions or concerns. The clinic phone number is [phone].    

## 2013-07-03 NOTE — Progress Notes (Signed)
I met briefly with patient today in chemotherapy.  Patient's daughter requested nutrition appointment.   This patient is a 79 year old male diagnosed with non-small cell lung cancer.   Height is 67 inches.  Weight 134 pounds.  BMI 20.98.  Usual body weight: 140 pounds.  Patient was receiving chemotherapy.  He was consuming a cheeseburger and french fries from [employer] at the time of my visit.  He reports he has a great appetite and eats well throughout the day.  He has no nutrition issues at this time.  There is no nutrition diagnosis.  Patient encouraged increased hydration, today and tomorrow, after chemotherapy.  I recommended patient consume small, frequent meals and snacks throughout the day for improved tolerance.  I will followup with patient on Thursday, October 2, during chemotherapy to address any nutrition concerns.

## 2013-07-03 NOTE — Telephone Encounter (Signed)
appts made and printed. Pt is aware that tx's will be added. i emailed MW...td

## 2013-07-03 NOTE — Telephone Encounter (Signed)
Per staff message and POF I have scheduled appts.  JMW  

## 2013-07-03 NOTE — Progress Notes (Signed)
[facility] Telephone:(336) [phone]   Fax:(336) [phone]  OFFICE PROGRESS NOTE  No PCP Per Patient [address] [place] [address]  DIAGNOSIS: Metastatic non-small cell lung cancer, squamous cell carcinoma with large left lower lobe lung mass as well as liver metastasis diagnosed in June of 2014   PRIOR THERAPY: Palliative radiotherapy to the left lower lobe obstructing mass under the care of Dr. Roselind Messier   CURRENT THERAPY: Systemic chemotherapy with carboplatin for AUC of 5 and paclitaxel 175 mg/M2 every 3 weeks with Neulasta support. Status post 3 cycles. Starting from cycle #4, his carboplatin would be for AUC of 6 and paclitaxel 200 mg/M2 every 3 weeks  CHEMOTHERAPY INTENT: Palliative  CURRENT # OF CHEMOTHERAPY CYCLES: 3  CURRENT ANTIEMETICS: Zofran, dexamethasone, Compazine  CURRENT SMOKING STATUS: Former smoker, quit 05/14/2013  ORAL CHEMOTHERAPY AND CONSENT: n/a  CURRENT BISPHOSPHONATES USE: none  PAIN MANAGEMENT: none  NARCOTICS INDUCED CONSTIPATION: none  LIVING WILL AND CODE STATUS: no CODE BLUE   INTERVAL HISTORY: Cameron Lam. 79 y.o. male returns to the clinic today for followup visit accompanied by his daughter. The patient is feeling fine today with no specific complaints. He tolerated the last cycle of his systemic chemotherapy fairly well with no significant adverse effects. He denied having any nausea or vomiting, no fever or chills. The patient denied having any significant chest pain, shortness breath, cough or hemoptysis. He has no peripheral neuropathy. He has repeat CT scan of the chest, abdomen and pelvis performed recently and he is here for evaluation and discussion of his scan results.  MEDICAL HISTORY: Past Medical History  Diagnosis Date  . Shingles outbreak 06/16/10    Lt anterior and postior thigh/buttocks  . Hypertension   . Tobacco abuse   . Lung cancer   . Hx of radiation therapy 6/26 - 05/07/13    LLL lung 37.5 gray  in 15 fractions    ALLERGIES:  has No Known Allergies.  MEDICATIONS:  Current Outpatient Prescriptions  Medication Sig Dispense Refill  . albuterol (PROVENTIL HFA;VENTOLIN HFA) 108 (90 BASE) MCG/ACT inhaler Inhale 2 puffs into the lungs every 6 (six) hours as needed for wheezing.  1 Inhaler  0  . amLODipine (NORVASC) 5 MG tablet Take 2 tablets (10 mg total) by mouth daily.  60 tablet  0  . aspirin 81 MG tablet Take 1 tablet (81 mg total) by mouth daily.  30 tablet  11  . feeding supplement (ENSURE COMPLETE) LIQD Take 237 mLs by mouth 2 (two) times daily between meals.  60 Bottle  0  . ferrous sulfate 325 (65 FE) MG tablet Take 1 tablet (325 mg total) by mouth 3 (three) times daily with meals.  90 tablet  3  . Multiple Vitamin (MULTIVITAMIN) tablet Take 1 tablet by mouth daily.      . prochlorperazine (COMPAZINE) 10 MG tablet Take 1 tablet (10 mg total) by mouth every 6 (six) hours as needed.  60 tablet  0   No current facility-administered medications for this visit.    SURGICAL HISTORY:  Past Surgical History  Procedure Laterality Date  . Video bronchoscopy Bilateral 04/10/2013    Procedure: VIDEO BRONCHOSCOPY WITH FLUORO;  Surgeon: Merwyn Katos, MD;  Location: [facility];  Service: Cardiopulmonary;  Laterality: Bilateral;    REVIEW OF SYSTEMS:  Constitutional: negative Eyes: negative Ears, nose, mouth, throat, and face: negative Respiratory: negative Cardiovascular: negative Gastrointestinal: negative Genitourinary:negative Integument/breast: negative Hematologic/lymphatic: negative Musculoskeletal:negative  Neurological: negative Behavioral/Psych: negative Endocrine: negative Allergic/Immunologic: negative   PHYSICAL EXAMINATION: General appearance: alert, cooperative and no distress Head: Normocephalic, without obvious abnormality, atraumatic Neck: no adenopathy and no JVD Lymph nodes: Cervical, supraclavicular, and axillary nodes normal. Resp: clear to  auscultation bilaterally and normal percussion bilaterally Back: symmetric, no curvature. ROM normal. No CVA tenderness. Cardio: regular rate and rhythm, S1, S2 normal, no murmur, click, rub or gallop GI: soft, non-tender; bowel sounds normal; no masses,  no organomegaly Extremities: extremities normal, atraumatic, no cyanosis or edema Neurologic: Alert and oriented X 3, normal strength and tone. Normal symmetric reflexes. Normal coordination and gait  ECOG PERFORMANCE STATUS: 1 - Symptomatic but completely ambulatory  Blood pressure 130/79, pulse 85, temperature 97.5 F (36.4 C), temperature source Oral, resp. rate 18, height 5\' 7"  (1.702 m), weight 134 lb (60.782 kg).  LABORATORY DATA: Lab Results  Component Value Date   WBC 15.9* 07/03/2013   HGB 10.3* 07/03/2013   HCT 33.2* 07/03/2013   MCV 82.6 07/03/2013   PLT 319 07/03/2013      Chemistry      Component Value Date/Time   NA 140 06/26/2013 0939   NA 141 06/19/2013 0948   K 3.7 06/26/2013 0939   K 3.9 06/19/2013 0948   CL 104 06/26/2013 0939   CO2 28 06/26/2013 0939   CO2 25 06/19/2013 0948   BUN 10 06/26/2013 0939   BUN 8.9 06/19/2013 0948   CREATININE 0.78 06/26/2013 0939   CREATININE 0.8 06/19/2013 0948      Component Value Date/Time   CALCIUM 9.0 06/26/2013 0939   CALCIUM 9.0 06/19/2013 0948   ALKPHOS 153* 06/26/2013 0939   ALKPHOS 183* 06/19/2013 0948   AST 20 06/26/2013 0939   AST 30 06/19/2013 0948   ALT 13 06/26/2013 0939   ALT 24 06/19/2013 0948   BILITOT 0.1* 06/26/2013 0939   BILITOT 0.20 06/19/2013 0948       RADIOGRAPHIC STUDIES: Ct Chest W Contrast  06/30/2013   CLINICAL DATA:  Followup lung carcinoma. Previous radiation therapy. Currently undergoing chemotherapy. Emphysema.  EXAM: CT CHEST, ABDOMEN, AND PELVIS WITH CONTRAST  TECHNIQUE: Multidetector CT imaging of the chest, abdomen and pelvis was performed following the standard protocol during bolus administration of intravenous contrast.  CONTRAST:  100 cc Omnipaque 300   COMPARISON:  Chest CT on 04/04/2013 and abdomen pelvis CT on 04/09/2013  FINDINGS:   CT CHEST FINDINGS  Severe bullous emphysema again demonstrated. Improved aeration is seen in the left lower lobe, while there is increased atelectasis seen in the lingula. Residual rounded opacity in the central and medial left lower lobe measures approximately 1.8 x 4.0 cm compared to 5.6 x 7.0 cm previously.  The spiculated nodule in the central right upper lobe on image 29 has decreased in size, currently measuring 8 x 11 mm compared with 12 x 19 mm previously. Previously seen spiculated nodule in the lateral aspect of the right upper lobe has nearly completely resolved.  No evidence of pleural or pericardial effusion. Mild left hilar lymphadenopathy is decreased, currently measuring 1.6 cm on image 38 compared to 2.3 cm previously. No evidence of mediastinal lymphadenopathy. Mild cardiomegaly stable.    CT ABDOMEN AND PELVIS FINDINGS  Low-attenuation mass lesion in the posterior right hepatic lobe currently measures 3.2 cm compared to 2.4 cm on previous study. A similar lesion in the central lateral segment left hepatic lobe currently measures 1.6 cm compared to 2.0 cm previously. No new liver lesions identified.  An ill-defined low-attenuation lesion in the medial aspect of the spleen currently measures 2.9 cm, mildly increased compared to 2.3 cm previously. No other splenic lesions identified. The pancreas, kidneys, and adrenal glands are unremarkable. There is mild upper abdominal lymphadenopathy in the porta hepatis and upper abdominal retroperitoneum which shows no significant change. Mild lymphadenopathy in the central small bowel mesentery shows no significant change.  No evidence of focal inflammatory process or abnormal fluid collections. A small right inguinal hernia is again noted. No evidence of bowel obstruction.    IMPRESSION: CT CHEST IMPRESSION  Decreased left lower lobe opacity and mild left hilar  lymphadenopathy since prior exam.  Decreased size of spiculated right upper lobe pulmonary nodules since prior exam.  No new or progressive disease within the thorax.  Severe bullous emphysema.  CT ABDOMEN AND PELVIS IMPRESSION  Mild increase in posterior right hepatic lobe lesion, suspicious for metastasis.  Mild increase in size of low-attenuation splenic lesion, suspicious for metastasis.  Stable mild the lymphadenopathy and porta hepatis, upper abdominal retroperitoneum, and central small bowel mesentery.   Electronically Signed   By: Myles Rosenthal   On: 06/30/2013 16:32   ASSESSMENT AND PLAN: This is a very pleasant 79 years old African American male with metastatic non-small cell lung cancer currently undergoing systemic chemotherapy with carboplatin and paclitaxel is status post 3 cycles with significant improvement in his disease in the chest but mild increase of the tumor in the liver. I discussed the scan results with the patient and his daughter and showed them the images. I recommended for the patient to continue on the same treatment regimen with carboplatin and paclitaxel but I would increase your dose of carboplatin to AUC of 6 and paclitaxel to 200 mg/M2 with Neulasta support. The patient will start the first cycle of this treatment today. He would come back for followup visit in 3 weeks with the next cycle of his treatment. He was advised to call immediately if he has any concerning symptoms in the interval. The patient voices understanding of current disease status and treatment options and is in agreement with the current care plan.  All questions were answered. The patient knows to call the clinic with any problems, questions or concerns. We can certainly see the patient much sooner if necessary.  I spent 15 minutes counseling the patient face to face. The total time spent in the appointment was 25 minutes.

## 2013-07-04 ENCOUNTER — Ambulatory Visit (HOSPITAL_BASED_OUTPATIENT_CLINIC_OR_DEPARTMENT_OTHER): Payer: Medicare Other

## 2013-07-04 VITALS — BP 124/73 | HR 107 | Temp 97.9°F

## 2013-07-04 DIAGNOSIS — C3492 Malignant neoplasm of unspecified part of left bronchus or lung: Secondary | ICD-10-CM

## 2013-07-04 DIAGNOSIS — C3430 Malignant neoplasm of lower lobe, unspecified bronchus or lung: Secondary | ICD-10-CM

## 2013-07-04 DIAGNOSIS — Z5189 Encounter for other specified aftercare: Secondary | ICD-10-CM

## 2013-07-04 MED ORDER — PEGFILGRASTIM INJECTION 6 MG/0.6ML
6.0000 mg | Freq: Once | SUBCUTANEOUS | Status: AC
  Administered 2013-07-04: 6 mg via SUBCUTANEOUS
  Filled 2013-07-04: qty 0.6

## 2013-07-05 ENCOUNTER — Encounter: Payer: Self-pay | Admitting: Internal Medicine

## 2013-07-05 NOTE — Patient Instructions (Addendum)
CURRENT THERAPY: Systemic chemotherapy with carboplatin for AUC of 5 and paclitaxel 175 mg/M2 every 3 weeks with Neulasta support. Status post 3 cycles. Starting from cycle #4, his carboplatin would be for AUC of 6 and paclitaxel 200 mg/M2 every 3 weeks  CHEMOTHERAPY INTENT: Palliative  CURRENT # OF CHEMOTHERAPY CYCLES: 3  CURRENT ANTIEMETICS: Zofran, dexamethasone, Compazine  CURRENT SMOKING STATUS: Former smoker, quit 05/14/2013  ORAL CHEMOTHERAPY AND CONSENT: n/a  CURRENT BISPHOSPHONATES USE: none  PAIN MANAGEMENT: none  NARCOTICS INDUCED CONSTIPATION: none  LIVING WILL AND CODE STATUS: no CODE BLUE

## 2013-07-10 ENCOUNTER — Other Ambulatory Visit (HOSPITAL_BASED_OUTPATIENT_CLINIC_OR_DEPARTMENT_OTHER): Payer: Medicare Other

## 2013-07-10 DIAGNOSIS — C3490 Malignant neoplasm of unspecified part of unspecified bronchus or lung: Secondary | ICD-10-CM

## 2013-07-10 LAB — CBC WITH DIFFERENTIAL/PLATELET
BASO%: 0.2 % (ref 0.0–2.0)
HCT: 29.2 % — ABNORMAL LOW (ref 38.4–49.9)
MCHC: 31.7 g/dL — ABNORMAL LOW (ref 32.0–36.0)
MONO#: 2.1 10*3/uL — ABNORMAL HIGH (ref 0.1–0.9)
NEUT#: 29.1 10*3/uL — ABNORMAL HIGH (ref 1.5–6.5)
NEUT%: 74.1 % (ref 39.0–75.0)
WBC: 39.4 10*3/uL — ABNORMAL HIGH (ref 4.0–10.3)
lymph#: 7.9 10*3/uL — ABNORMAL HIGH (ref 0.9–3.3)

## 2013-07-10 LAB — COMPREHENSIVE METABOLIC PANEL (CC13)
ALT: 16 U/L (ref 0–55)
CO2: 27 mEq/L (ref 22–29)
Calcium: 9 mg/dL (ref 8.4–10.4)
Chloride: 107 mEq/L (ref 98–109)
Creatinine: 0.8 mg/dL (ref 0.7–1.3)
Sodium: 140 mEq/L (ref 136–145)
Total Protein: 6.6 g/dL (ref 6.4–8.3)

## 2013-07-17 ENCOUNTER — Other Ambulatory Visit (HOSPITAL_BASED_OUTPATIENT_CLINIC_OR_DEPARTMENT_OTHER): Payer: Medicare Other

## 2013-07-17 DIAGNOSIS — C3490 Malignant neoplasm of unspecified part of unspecified bronchus or lung: Secondary | ICD-10-CM

## 2013-07-17 LAB — CBC WITH DIFFERENTIAL/PLATELET
BASO%: 0.7 % (ref 0.0–2.0)
MCHC: 31.5 g/dL — ABNORMAL LOW (ref 32.0–36.0)
MONO#: 1.1 10*3/uL — ABNORMAL HIGH (ref 0.1–0.9)
RBC: 3.96 10*6/uL — ABNORMAL LOW (ref 4.20–5.82)
RDW: 18.6 % — ABNORMAL HIGH (ref 11.0–14.6)
WBC: 24.9 10*3/uL — ABNORMAL HIGH (ref 4.0–10.3)
lymph#: 9.8 10*3/uL — ABNORMAL HIGH (ref 0.9–3.3)

## 2013-07-17 LAB — COMPREHENSIVE METABOLIC PANEL (CC13)
ALT: 10 U/L (ref 0–55)
CO2: 25 mEq/L (ref 22–29)
Calcium: 9.8 mg/dL (ref 8.4–10.4)
Chloride: 104 mEq/L (ref 98–109)
Sodium: 139 mEq/L (ref 136–145)
Total Bilirubin: 0.27 mg/dL (ref 0.20–1.20)
Total Protein: 7.2 g/dL (ref 6.4–8.3)

## 2013-07-24 ENCOUNTER — Other Ambulatory Visit: Payer: Medicare Other | Admitting: Lab

## 2013-07-24 ENCOUNTER — Ambulatory Visit (HOSPITAL_BASED_OUTPATIENT_CLINIC_OR_DEPARTMENT_OTHER): Payer: Medicare Other

## 2013-07-24 ENCOUNTER — Encounter: Payer: Self-pay | Admitting: Physician Assistant

## 2013-07-24 ENCOUNTER — Ambulatory Visit (HOSPITAL_BASED_OUTPATIENT_CLINIC_OR_DEPARTMENT_OTHER): Payer: Medicare Other | Admitting: Physician Assistant

## 2013-07-24 ENCOUNTER — Ambulatory Visit: Payer: Medicare Other | Admitting: Nutrition

## 2013-07-24 ENCOUNTER — Other Ambulatory Visit (HOSPITAL_BASED_OUTPATIENT_CLINIC_OR_DEPARTMENT_OTHER): Payer: Medicare Other | Admitting: Lab

## 2013-07-24 VITALS — BP 123/72 | HR 86 | Temp 97.0°F | Resp 18 | Ht 67.0 in | Wt 135.6 lb

## 2013-07-24 DIAGNOSIS — C787 Secondary malignant neoplasm of liver and intrahepatic bile duct: Secondary | ICD-10-CM

## 2013-07-24 DIAGNOSIS — C3430 Malignant neoplasm of lower lobe, unspecified bronchus or lung: Secondary | ICD-10-CM

## 2013-07-24 DIAGNOSIS — J441 Chronic obstructive pulmonary disease with (acute) exacerbation: Secondary | ICD-10-CM

## 2013-07-24 DIAGNOSIS — Z5111 Encounter for antineoplastic chemotherapy: Secondary | ICD-10-CM

## 2013-07-24 DIAGNOSIS — C3492 Malignant neoplasm of unspecified part of left bronchus or lung: Secondary | ICD-10-CM

## 2013-07-24 DIAGNOSIS — C3490 Malignant neoplasm of unspecified part of unspecified bronchus or lung: Secondary | ICD-10-CM

## 2013-07-24 LAB — CBC WITH DIFFERENTIAL/PLATELET
BASO%: 0.7 % (ref 0.0–2.0)
EOS%: 0.3 % (ref 0.0–7.0)
LYMPH%: 43.8 % (ref 14.0–49.0)
MCHC: 31.2 g/dL — ABNORMAL LOW (ref 32.0–36.0)
MONO#: 1 10*3/uL — ABNORMAL HIGH (ref 0.1–0.9)
Platelets: 269 10*3/uL (ref 140–400)
RBC: 3.95 10*6/uL — ABNORMAL LOW (ref 4.20–5.82)
WBC: 15 10*3/uL — ABNORMAL HIGH (ref 4.0–10.3)
nRBC: 0 % (ref 0–0)

## 2013-07-24 LAB — COMPREHENSIVE METABOLIC PANEL (CC13)
ALT: 11 U/L (ref 0–55)
AST: 22 U/L (ref 5–34)
Alkaline Phosphatase: 116 U/L (ref 40–150)
Calcium: 9.6 mg/dL (ref 8.4–10.4)
Chloride: 104 mEq/L (ref 98–109)
Creatinine: 0.8 mg/dL (ref 0.7–1.3)
Total Bilirubin: 0.28 mg/dL (ref 0.20–1.20)

## 2013-07-24 MED ORDER — CARBOPLATIN CHEMO INJECTION 600 MG/60ML
550.0000 mg | Freq: Once | INTRAVENOUS | Status: AC
  Administered 2013-07-24: 550 mg via INTRAVENOUS
  Filled 2013-07-24: qty 55

## 2013-07-24 MED ORDER — ONDANSETRON 16 MG/50ML IVPB (CHCC)
16.0000 mg | Freq: Once | INTRAVENOUS | Status: AC
  Administered 2013-07-24: 16 mg via INTRAVENOUS

## 2013-07-24 MED ORDER — DIPHENHYDRAMINE HCL 50 MG/ML IJ SOLN
50.0000 mg | Freq: Once | INTRAMUSCULAR | Status: AC
  Administered 2013-07-24: 50 mg via INTRAVENOUS

## 2013-07-24 MED ORDER — FAMOTIDINE IN NACL 20-0.9 MG/50ML-% IV SOLN
INTRAVENOUS | Status: AC
  Filled 2013-07-24: qty 50

## 2013-07-24 MED ORDER — SODIUM CHLORIDE 0.9 % IV SOLN
Freq: Once | INTRAVENOUS | Status: AC
  Administered 2013-07-24: 11:00:00 via INTRAVENOUS

## 2013-07-24 MED ORDER — DIPHENHYDRAMINE HCL 50 MG/ML IJ SOLN
INTRAMUSCULAR | Status: AC
  Filled 2013-07-24: qty 1

## 2013-07-24 MED ORDER — ALBUTEROL SULFATE HFA 108 (90 BASE) MCG/ACT IN AERS: 2.0000 | INHALATION_SPRAY | Freq: Four times a day (QID) | RESPIRATORY_TRACT | Status: AC | PRN

## 2013-07-24 MED ORDER — SODIUM CHLORIDE 0.9 % IV SOLN
342.0000 mg | Freq: Once | INTRAVENOUS | Status: AC
  Administered 2013-07-24: 342 mg via INTRAVENOUS
  Filled 2013-07-24: qty 57

## 2013-07-24 MED ORDER — FAMOTIDINE IN NACL 20-0.9 MG/50ML-% IV SOLN
20.0000 mg | Freq: Once | INTRAVENOUS | Status: AC
  Administered 2013-07-24: 20 mg via INTRAVENOUS

## 2013-07-24 MED ORDER — DEXAMETHASONE SODIUM PHOSPHATE 20 MG/5ML IJ SOLN
INTRAMUSCULAR | Status: AC
  Filled 2013-07-24: qty 5

## 2013-07-24 MED ORDER — DEXAMETHASONE SODIUM PHOSPHATE 20 MG/5ML IJ SOLN
20.0000 mg | Freq: Once | INTRAMUSCULAR | Status: AC
  Administered 2013-07-24: 20 mg via INTRAVENOUS

## 2013-07-24 MED ORDER — DEXTROSE 5 % IV SOLN: 200.0000 mg/m2 | Freq: Once | INTRAVENOUS | Status: DC

## 2013-07-24 MED ORDER — ONDANSETRON 16 MG/50ML IVPB (CHCC)
INTRAVENOUS | Status: AC
  Filled 2013-07-24: qty 16

## 2013-07-24 NOTE — Progress Notes (Signed)
I spoke with patient briefly in chemotherapy.  Patient continues to eat well.  His weight is stable at about 135 pounds.  Patient reports no nutrition side effects.  There is no nutrition diagnosis.  Patient was encouraged to contact me if he develops problems or questions.

## 2013-07-24 NOTE — Patient Instructions (Signed)
[facility] Discharge Instructions for Patients Receiving Chemotherapy  Today you received the following chemotherapy agents taxol, carboplatin  To help prevent nausea and vomiting after your treatment, we encourage you to take your nausea medication Other Compazine 10 mg every six hours or 30 minutes before meals and at bedtime.   If you develop nausea and vomiting that is not controlled by your nausea medication, call the clinic.   BELOW ARE SYMPTOMS THAT SHOULD BE REPORTED IMMEDIATELY:  *FEVER GREATER THAN 100.5 F  *CHILLS WITH OR WITHOUT FEVER  NAUSEA AND VOMITING THAT IS NOT CONTROLLED WITH YOUR NAUSEA MEDICATION  *UNUSUAL SHORTNESS OF BREATH  *UNUSUAL BRUISING OR BLEEDING  TENDERNESS IN MOUTH AND THROAT WITH OR WITHOUT PRESENCE OF ULCERS  *URINARY PROBLEMS  *BOWEL PROBLEMS  UNUSUAL RASH Items with * indicate a potential emergency and should be followed up as soon as possible.  Feel free to call the clinic you have any questions or concerns. The clinic phone number is [phone].

## 2013-07-24 NOTE — Progress Notes (Signed)
Discharged with daughter to home, ambulatory in no distress.

## 2013-07-24 NOTE — Progress Notes (Addendum)
[facility] [phone]   [phone]  SHARED VISIT PROGRESS NOTE  No PCP Per Patient [address] [place] [address]  DIAGNOSIS: Metastatic non-small cell lung cancer, squamous cell carcinoma with large left lower lobe lung mass as well as liver metastasis diagnosed in June of 2014   PRIOR THERAPY: Palliative radiotherapy to the left lower lobe obstructing mass under the care of Dr. Roselind Messier   CURRENT THERAPY: Systemic chemotherapy with carboplatin for AUC of 5 and paclitaxel 175 mg/M2 every 3 weeks with Neulasta support. Status post 4 cycles. Starting from cycle #4, his carboplatin would be for AUC of 6 and paclitaxel 200 mg/M2 every 3 weeks  CHEMOTHERAPY INTENT: Palliative  CURRENT # OF CHEMOTHERAPY CYCLES: 4  CURRENT ANTIEMETICS: Zofran, dexamethasone, Compazine  CURRENT SMOKING STATUS: Former smoker, quit 05/14/2013  ORAL CHEMOTHERAPY AND CONSENT: n/a  CURRENT BISPHOSPHONATES USE: none  PAIN MANAGEMENT: none  NARCOTICS INDUCED CONSTIPATION: none  LIVING WILL AND CODE STATUS: no CODE BLUE   INTERVAL HISTORY: Cameron Lam. 79 y.o. male returns to the clinic today for followup visit accompanied by his daughter. The patient is feeling fine today with no specific complaints. He tolerated the last cycle of his systemic chemotherapy fairly well with no significant adverse effects. He denied having any nausea or vomiting, no fever or chills. The patient denied having any significant chest pain, shortness breath, cough or hemoptysis. He has no peripheral neuropathy. He reports that his appetite is good. He is still able to do a lot of yard work without difficulty. He requests a refill for his albuterol inhaler. He presents to proceed with cycle #5 of his systemic chemotherapy with carboplatin and paclitaxel with Neulasta support.  MEDICAL HISTORY: Past Medical History  Diagnosis Date  . Shingles outbreak 06/16/10    Lt anterior and postior  thigh/buttocks  . Hypertension   . Tobacco abuse   . Lung cancer   . Hx of radiation therapy 6/26 - 05/07/13    LLL lung 37.5 gray in 15 fractions    ALLERGIES:  has No Known Allergies.  MEDICATIONS:  Current Outpatient Prescriptions  Medication Sig Dispense Refill  . albuterol (PROVENTIL HFA;VENTOLIN HFA) 108 (90 BASE) MCG/ACT inhaler Inhale 2 puffs into the lungs every 6 (six) hours as needed for wheezing.  1 Inhaler  3  . amLODipine (NORVASC) 5 MG tablet Take 2 tablets (10 mg total) by mouth daily.  60 tablet  0  . aspirin 81 MG tablet Take 1 tablet (81 mg total) by mouth daily.  30 tablet  11  . feeding supplement (ENSURE COMPLETE) LIQD Take 237 mLs by mouth 2 (two) times daily between meals.  60 Bottle  0  . ferrous sulfate 325 (65 FE) MG tablet Take 1 tablet (325 mg total) by mouth 3 (three) times daily with meals.  90 tablet  3  . Multiple Vitamin (MULTIVITAMIN) tablet Take 1 tablet by mouth daily.      . prochlorperazine (COMPAZINE) 10 MG tablet Take 1 tablet (10 mg total) by mouth every 6 (six) hours as needed.  60 tablet  0   No current facility-administered medications for this visit.   Facility-Administered Medications Ordered in Other Visits  Medication Dose Route Frequency Provider Last Rate Last Dose  . CARBOplatin (PARAPLATIN) 550 mg in sodium chloride 0.9 % 250 mL chemo infusion  550 mg Intravenous Once Carmaleta Youngers E Kavi Almquist, PA-C      . PACLitaxel (TAXOL) 342  mg in sodium chloride 0.9 % 500 mL chemo infusion (> 80mg /m2)  342 mg Intravenous Once Si Gaul, MD 186 mL/hr at 07/24/13 1225 342 mg at 07/24/13 1225    SURGICAL HISTORY:  Past Surgical History  Procedure Laterality Date  . Video bronchoscopy Bilateral 04/10/2013    Procedure: VIDEO BRONCHOSCOPY WITH FLUORO;  Surgeon: Merwyn Katos, MD;  Location: [facility];  Service: Cardiopulmonary;  Laterality: Bilateral;    REVIEW OF SYSTEMS:  Constitutional: negative Eyes: negative Ears, nose, mouth, throat, and  face: negative Respiratory: negative Cardiovascular: negative Gastrointestinal: negative Genitourinary:negative Integument/breast: negative Hematologic/lymphatic: negative Musculoskeletal:negative Neurological: negative Behavioral/Psych: negative Endocrine: negative Allergic/Immunologic: negative   PHYSICAL EXAMINATION: General appearance: alert, cooperative and no distress Head: Normocephalic, without obvious abnormality, atraumatic Neck: no adenopathy and no JVD Lymph nodes: Cervical, supraclavicular, and axillary nodes normal. Resp: clear to auscultation bilaterally and normal percussion bilaterally Back: symmetric, no curvature. ROM normal. No CVA tenderness. Cardio: regular rate and rhythm, S1, S2 normal, no murmur, click, rub or gallop GI: soft, non-tender; bowel sounds normal; no masses,  no organomegaly Extremities: extremities normal, atraumatic, no cyanosis or edema Neurologic: Alert and oriented X 3, normal strength and tone. Normal symmetric reflexes. Normal coordination and gait  ECOG PERFORMANCE STATUS: 1 - Symptomatic but completely ambulatory  Blood pressure 123/72, pulse 86, temperature 97 F (36.1 C), temperature source Oral, resp. rate 18, height 5\' 7"  (1.702 m), weight 135 lb 9.6 oz (61.508 kg), SpO2 98.00%.  LABORATORY DATA: Lab Results  Component Value Date   WBC 15.0* 07/24/2013   HGB 10.2* 07/24/2013   HCT 32.7* 07/24/2013   MCV 82.8 07/24/2013   PLT 269 07/24/2013      Chemistry      Component Value Date/Time   NA 138 07/24/2013 0842   NA 140 06/26/2013 0939   K 5.0 07/24/2013 0842   K 3.7 06/26/2013 0939   CL 104 06/26/2013 0939   CO2 25 07/24/2013 0842   CO2 28 06/26/2013 0939   BUN 14.6 07/24/2013 0842   BUN 10 06/26/2013 0939   CREATININE 0.8 07/24/2013 0842   CREATININE 0.78 06/26/2013 0939      Component Value Date/Time   CALCIUM 9.6 07/24/2013 0842   CALCIUM 9.0 06/26/2013 0939   ALKPHOS 116 07/24/2013 0842   ALKPHOS 153* 06/26/2013 0939   AST 22  07/24/2013 0842   AST 20 06/26/2013 0939   ALT 11 07/24/2013 0842   ALT 13 06/26/2013 0939   BILITOT 0.28 07/24/2013 0842   BILITOT 0.1* 06/26/2013 0939       RADIOGRAPHIC STUDIES: Ct Chest W Contrast  06/30/2013   CLINICAL DATA:  Followup lung carcinoma. Previous radiation therapy. Currently undergoing chemotherapy. Emphysema.  EXAM: CT CHEST, ABDOMEN, AND PELVIS WITH CONTRAST  TECHNIQUE: Multidetector CT imaging of the chest, abdomen and pelvis was performed following the standard protocol during bolus administration of intravenous contrast.  CONTRAST:  100 cc Omnipaque 300  COMPARISON:  Chest CT on 04/04/2013 and abdomen pelvis CT on 04/09/2013  FINDINGS:   CT CHEST FINDINGS  Severe bullous emphysema again demonstrated. Improved aeration is seen in the left lower lobe, while there is increased atelectasis seen in the lingula. Residual rounded opacity in the central and medial left lower lobe measures approximately 1.8 x 4.0 cm compared to 5.6 x 7.0 cm previously.  The spiculated nodule in the central right upper lobe on image 29 has decreased in size, currently measuring 8 x 11 mm compared with 12 x 19 mm  previously. Previously seen spiculated nodule in the lateral aspect of the right upper lobe has nearly completely resolved.  No evidence of pleural or pericardial effusion. Mild left hilar lymphadenopathy is decreased, currently measuring 1.6 cm on image 38 compared to 2.3 cm previously. No evidence of mediastinal lymphadenopathy. Mild cardiomegaly stable.    CT ABDOMEN AND PELVIS FINDINGS  Low-attenuation mass lesion in the posterior right hepatic lobe currently measures 3.2 cm compared to 2.4 cm on previous study. A similar lesion in the central lateral segment left hepatic lobe currently measures 1.6 cm compared to 2.0 cm previously. No new liver lesions identified.  An ill-defined low-attenuation lesion in the medial aspect of the spleen currently measures 2.9 cm, mildly increased compared to 2.3 cm  previously. No other splenic lesions identified. The pancreas, kidneys, and adrenal glands are unremarkable. There is mild upper abdominal lymphadenopathy in the porta hepatis and upper abdominal retroperitoneum which shows no significant change. Mild lymphadenopathy in the central small bowel mesentery shows no significant change.  No evidence of focal inflammatory process or abnormal fluid collections. A small right inguinal hernia is again noted. No evidence of bowel obstruction.    IMPRESSION: CT CHEST IMPRESSION  Decreased left lower lobe opacity and mild left hilar lymphadenopathy since prior exam.  Decreased size of spiculated right upper lobe pulmonary nodules since prior exam.  No new or progressive disease within the thorax.  Severe bullous emphysema.  CT ABDOMEN AND PELVIS IMPRESSION  Mild increase in posterior right hepatic lobe lesion, suspicious for metastasis.  Mild increase in size of low-attenuation splenic lesion, suspicious for metastasis.  Stable mild the lymphadenopathy and porta hepatis, upper abdominal retroperitoneum, and central small bowel mesentery.   Electronically Signed   By: Myles Rosenthal   On: 06/30/2013 16:32   ASSESSMENT AND PLAN: This is a very pleasant 79 years old African American male with metastatic non-small cell lung cancer currently undergoing systemic chemotherapy with carboplatin and paclitaxel is status post 4 cycles with significant improvement in his disease in the chest but mild increase of the tumor in the liver. Patient was discussed with him also seen by Dr. Arbutus Ped. A refill for his albuterol inhaler was sent to his pharmacy of record via E. scribed. He'll proceed with cycle #5 of his systemic chemotherapy with carboplatin and paclitaxel with Neulasta support. He will continue with weekly labs consisting of a CBC differential and C. met. He'll followup  in 3 weeks with a repeat CBC differential, C. met prior to cycle #6.  Laural Benes, Deonne Rooks E, PA-C   He was  advised to call immediately if he has any concerning symptoms in the interval. The patient voices understanding of current disease status and treatment options and is in agreement with the current care plan.  All questions were answered. The patient knows to call the clinic with any problems, questions or concerns. We can certainly see the patient much sooner if necessary.  ADDENDUM: Hematology/Oncology Attending: I had a face to face encounter with the patient. I recommended his care plan. The patient is a very pleasant 79 years old [place] American male with metastatic non-small cell lung cancer, squamous cell carcinoma currently undergoing systemic chemotherapy with carboplatin and paclitaxel is status post 4 cycles. He is rating his treatment fairly well with no significant adverse effects. I recommended for the patient to proceed with cycle #5 of his chemotherapy today as scheduled. He would come back for follow up visit in 3 weeks with the next cycle of  his treatment. He was advised to call immediately if he has any concerning symptoms in the interval. Lajuana Matte., MD 07/27/2013

## 2013-07-24 NOTE — Patient Instructions (Addendum)
Continue weekly labs as scheduled. Follow up in 3 weeks, prior to your next scheduled cycle of chemotherapy 

## 2013-07-25 ENCOUNTER — Ambulatory Visit (HOSPITAL_BASED_OUTPATIENT_CLINIC_OR_DEPARTMENT_OTHER): Payer: Medicare Other

## 2013-07-25 ENCOUNTER — Telehealth: Payer: Self-pay | Admitting: Internal Medicine

## 2013-07-25 VITALS — BP 136/76 | HR 94 | Temp 98.2°F

## 2013-07-25 DIAGNOSIS — C3430 Malignant neoplasm of lower lobe, unspecified bronchus or lung: Secondary | ICD-10-CM

## 2013-07-25 DIAGNOSIS — C3492 Malignant neoplasm of unspecified part of left bronchus or lung: Secondary | ICD-10-CM

## 2013-07-25 MED ORDER — PEGFILGRASTIM INJECTION 6 MG/0.6ML
6.0000 mg | Freq: Once | SUBCUTANEOUS | Status: AC
  Administered 2013-07-25: 6 mg via SUBCUTANEOUS
  Filled 2013-07-25: qty 0.6

## 2013-07-25 NOTE — Telephone Encounter (Signed)
s.w. pt and advised on OCT appts....pt ok and aware °

## 2013-07-31 ENCOUNTER — Other Ambulatory Visit (HOSPITAL_BASED_OUTPATIENT_CLINIC_OR_DEPARTMENT_OTHER): Payer: Medicare Other | Admitting: Lab

## 2013-07-31 DIAGNOSIS — C3430 Malignant neoplasm of lower lobe, unspecified bronchus or lung: Secondary | ICD-10-CM

## 2013-07-31 DIAGNOSIS — C3490 Malignant neoplasm of unspecified part of unspecified bronchus or lung: Secondary | ICD-10-CM

## 2013-07-31 LAB — CBC WITH DIFFERENTIAL/PLATELET
BASO%: 0.5 % (ref 0.0–2.0)
Basophils Absolute: 0.2 10*3/uL — ABNORMAL HIGH (ref 0.0–0.1)
Eosinophils Absolute: 0 10*3/uL (ref 0.0–0.5)
HCT: 29 % — ABNORMAL LOW (ref 38.4–49.9)
HGB: 9.2 g/dL — ABNORMAL LOW (ref 13.0–17.1)
LYMPH%: 21.5 % (ref 14.0–49.0)
MCHC: 31.7 g/dL — ABNORMAL LOW (ref 32.0–36.0)
MCV: 81.8 fL (ref 79.3–98.0)
MONO#: 1.4 10*3/uL — ABNORMAL HIGH (ref 0.1–0.9)
MONO%: 3.8 % (ref 0.0–14.0)
NEUT#: 26.9 10*3/uL — ABNORMAL HIGH (ref 1.5–6.5)
NEUT%: 74.1 % (ref 39.0–75.0)
Platelets: 189 10*3/uL (ref 140–400)
RBC: 3.54 10*6/uL — ABNORMAL LOW (ref 4.20–5.82)
lymph#: 7.8 10*3/uL — ABNORMAL HIGH (ref 0.9–3.3)

## 2013-07-31 LAB — COMPREHENSIVE METABOLIC PANEL (CC13)
Albumin: 3.9 g/dL (ref 3.5–5.0)
Alkaline Phosphatase: 168 U/L — ABNORMAL HIGH (ref 40–150)
Anion Gap: 9 mEq/L (ref 3–11)
BUN: 8.9 mg/dL (ref 7.0–26.0)
CO2: 26 mEq/L (ref 22–29)
Chloride: 103 mEq/L (ref 98–109)
Glucose: 88 mg/dl (ref 70–140)
Potassium: 3.6 mEq/L (ref 3.5–5.1)
Total Bilirubin: 0.29 mg/dL (ref 0.20–1.20)

## 2013-08-04 ENCOUNTER — Telehealth: Payer: Self-pay

## 2013-08-04 NOTE — Telephone Encounter (Signed)
I am trying to find the note where I prescribed bloodl pressure medicine but I cannot locate it.  Which blood pressure medicine are we talking about?  Who prescribed the medicine bid?

## 2013-08-04 NOTE — Telephone Encounter (Signed)
Pt of Dr.L. Is on a blood pressure rx that Dr.L prescribed to take 1x daily. Was recently released from hospital where the pt was told to take the rx 2x daily.  Daughter says that she needs authorization for the rx to be written for 2x daily. Will use [facility] on [address]. Call at [phone].

## 2013-08-04 NOTE — Telephone Encounter (Signed)
Please advise 

## 2013-08-05 NOTE — Telephone Encounter (Signed)
Increase the amlodipine to 10 mg, continue the hctz at 12.5

## 2013-08-05 NOTE — Telephone Encounter (Addendum)
03/02/13  Disp Refills hydrochlorothiazide (MICROZIDE) 12.5 MG capsule 90 capsule 3 03/02/2013 04/14/2013 Take 1 capsule (12.5 mg total) by mouth daily. - Oral amLODipine (NORVASC) 5 MG tablet  I think he needs to return, I am not sure which one he was told to increase. Please advise.

## 2013-08-06 MED ORDER — AMLODIPINE BESYLATE 10 MG PO TABS: 10.0000 mg | ORAL_TABLET | Freq: Every day | ORAL | Status: DC

## 2013-08-06 NOTE — Addendum Note (Signed)
Addended byCaffie Damme on: 08/06/2013 10:37 AM   Modules accepted: Orders

## 2013-08-06 NOTE — Telephone Encounter (Signed)
Sent in the Amlodipine 10mg . Called to advise.

## 2013-08-07 ENCOUNTER — Other Ambulatory Visit (HOSPITAL_BASED_OUTPATIENT_CLINIC_OR_DEPARTMENT_OTHER): Payer: Medicare Other | Admitting: Lab

## 2013-08-07 DIAGNOSIS — C3430 Malignant neoplasm of lower lobe, unspecified bronchus or lung: Secondary | ICD-10-CM

## 2013-08-07 DIAGNOSIS — C3490 Malignant neoplasm of unspecified part of unspecified bronchus or lung: Secondary | ICD-10-CM

## 2013-08-07 LAB — CBC WITH DIFFERENTIAL/PLATELET
BASO%: 0.8 % (ref 0.0–2.0)
Basophils Absolute: 0.2 10*3/uL — ABNORMAL HIGH (ref 0.0–0.1)
Eosinophils Absolute: 0.1 10*3/uL (ref 0.0–0.5)
HCT: 29.9 % — ABNORMAL LOW (ref 38.4–49.9)
LYMPH%: 30.3 % (ref 14.0–49.0)
MCHC: 31.7 g/dL — ABNORMAL LOW (ref 32.0–36.0)
MONO#: 1.2 10*3/uL — ABNORMAL HIGH (ref 0.1–0.9)
NEUT#: 14.8 10*3/uL — ABNORMAL HIGH (ref 1.5–6.5)
NEUT%: 63.5 % (ref 39.0–75.0)
Platelets: 288 10*3/uL (ref 140–400)
RBC: 3.65 10*6/uL — ABNORMAL LOW (ref 4.20–5.82)
WBC: 23.3 10*3/uL — ABNORMAL HIGH (ref 4.0–10.3)
lymph#: 7 10*3/uL — ABNORMAL HIGH (ref 0.9–3.3)

## 2013-08-07 LAB — COMPREHENSIVE METABOLIC PANEL (CC13)
ALT: 11 U/L (ref 0–55)
Albumin: 3.7 g/dL (ref 3.5–5.0)
Alkaline Phosphatase: 146 U/L (ref 40–150)
Anion Gap: 9 mEq/L (ref 3–11)
BUN: 10.3 mg/dL (ref 7.0–26.0)
CO2: 26 mEq/L (ref 22–29)
Calcium: 9.2 mg/dL (ref 8.4–10.4)
Chloride: 106 mEq/L (ref 98–109)
Creatinine: 0.8 mg/dL (ref 0.7–1.3)
Glucose: 83 mg/dl (ref 70–140)
Total Bilirubin: <0.2 mg/dL (ref 0.20–1.20)

## 2013-08-14 ENCOUNTER — Other Ambulatory Visit: Payer: Medicare Other | Admitting: Lab

## 2013-08-14 ENCOUNTER — Encounter: Payer: Self-pay | Admitting: Internal Medicine

## 2013-08-14 ENCOUNTER — Telehealth: Payer: Self-pay | Admitting: Internal Medicine

## 2013-08-14 ENCOUNTER — Ambulatory Visit (HOSPITAL_BASED_OUTPATIENT_CLINIC_OR_DEPARTMENT_OTHER): Payer: Medicare Other

## 2013-08-14 ENCOUNTER — Other Ambulatory Visit (HOSPITAL_BASED_OUTPATIENT_CLINIC_OR_DEPARTMENT_OTHER): Payer: Medicare Other | Admitting: Lab

## 2013-08-14 ENCOUNTER — Ambulatory Visit (HOSPITAL_BASED_OUTPATIENT_CLINIC_OR_DEPARTMENT_OTHER): Payer: Medicare Other | Admitting: Internal Medicine

## 2013-08-14 VITALS — BP 120/64 | HR 100 | Temp 97.0°F | Resp 20 | Ht 67.0 in | Wt 136.9 lb

## 2013-08-14 DIAGNOSIS — C3492 Malignant neoplasm of unspecified part of left bronchus or lung: Secondary | ICD-10-CM

## 2013-08-14 DIAGNOSIS — C787 Secondary malignant neoplasm of liver and intrahepatic bile duct: Secondary | ICD-10-CM

## 2013-08-14 DIAGNOSIS — C3490 Malignant neoplasm of unspecified part of unspecified bronchus or lung: Secondary | ICD-10-CM

## 2013-08-14 DIAGNOSIS — Z23 Encounter for immunization: Secondary | ICD-10-CM

## 2013-08-14 DIAGNOSIS — Z5111 Encounter for antineoplastic chemotherapy: Secondary | ICD-10-CM

## 2013-08-14 DIAGNOSIS — C3430 Malignant neoplasm of lower lobe, unspecified bronchus or lung: Secondary | ICD-10-CM

## 2013-08-14 LAB — CBC WITH DIFFERENTIAL/PLATELET
Basophils Absolute: 0.1 10*3/uL (ref 0.0–0.1)
EOS%: 0.3 % (ref 0.0–7.0)
HCT: 32.3 % — ABNORMAL LOW (ref 38.4–49.9)
HGB: 10 g/dL — ABNORMAL LOW (ref 13.0–17.1)
MCH: 25.2 pg — ABNORMAL LOW (ref 27.2–33.4)
MONO#: 1 10*3/uL — ABNORMAL HIGH (ref 0.1–0.9)
NEUT#: 6.1 10*3/uL (ref 1.5–6.5)
NEUT%: 45.6 % (ref 39.0–75.0)
RDW: 16.9 % — ABNORMAL HIGH (ref 11.0–14.6)
WBC: 13.4 10*3/uL — ABNORMAL HIGH (ref 4.0–10.3)
lymph#: 6.2 10*3/uL — ABNORMAL HIGH (ref 0.9–3.3)
nRBC: 0 % (ref 0–0)

## 2013-08-14 LAB — COMPREHENSIVE METABOLIC PANEL (CC13)
ALT: 8 U/L (ref 0–55)
AST: 19 U/L (ref 5–34)
Albumin: 3.7 g/dL (ref 3.5–5.0)
Alkaline Phosphatase: 116 U/L (ref 40–150)
BUN: 12.7 mg/dL (ref 7.0–26.0)
Calcium: 9.1 mg/dL (ref 8.4–10.4)
Chloride: 107 mEq/L (ref 98–109)
Glucose: 84 mg/dl (ref 70–140)
Potassium: 3.9 mEq/L (ref 3.5–5.1)
Sodium: 139 mEq/L (ref 136–145)
Total Protein: 7 g/dL (ref 6.4–8.3)

## 2013-08-14 MED ORDER — ONDANSETRON 16 MG/50ML IVPB (CHCC)
16.0000 mg | Freq: Once | INTRAVENOUS | Status: AC
  Administered 2013-08-14: 16 mg via INTRAVENOUS

## 2013-08-14 MED ORDER — FAMOTIDINE IN NACL 20-0.9 MG/50ML-% IV SOLN
20.0000 mg | Freq: Once | INTRAVENOUS | Status: AC
  Administered 2013-08-14: 20 mg via INTRAVENOUS

## 2013-08-14 MED ORDER — PNEUMOCOCCAL VAC POLYVALENT 25 MCG/0.5ML IJ INJ
0.5000 mL | INJECTION | Freq: Once | INTRAMUSCULAR | Status: AC
  Administered 2013-08-14: 0.5 mL via INTRAMUSCULAR
  Filled 2013-08-14: qty 0.5

## 2013-08-14 MED ORDER — DIPHENHYDRAMINE HCL 50 MG/ML IJ SOLN
50.0000 mg | Freq: Once | INTRAMUSCULAR | Status: AC
  Administered 2013-08-14: 50 mg via INTRAVENOUS

## 2013-08-14 MED ORDER — ONDANSETRON 16 MG/50ML IVPB (CHCC)
INTRAVENOUS | Status: AC
Start: 2013-08-14 — End: 2013-08-14
  Filled 2013-08-14: qty 16

## 2013-08-14 MED ORDER — SODIUM CHLORIDE 0.9 % IV SOLN
200.0000 mg/m2 | Freq: Once | INTRAVENOUS | Status: AC
  Administered 2013-08-14: 342 mg via INTRAVENOUS
  Filled 2013-08-14: qty 57

## 2013-08-14 MED ORDER — DIPHENHYDRAMINE HCL 50 MG/ML IJ SOLN
INTRAMUSCULAR | Status: AC
  Filled 2013-08-14: qty 1

## 2013-08-14 MED ORDER — DEXAMETHASONE SODIUM PHOSPHATE 20 MG/5ML IJ SOLN
INTRAMUSCULAR | Status: AC
  Filled 2013-08-14: qty 5

## 2013-08-14 MED ORDER — FAMOTIDINE IN NACL 20-0.9 MG/50ML-% IV SOLN
INTRAVENOUS | Status: AC
  Filled 2013-08-14: qty 50

## 2013-08-14 MED ORDER — SODIUM CHLORIDE 0.9 % IV SOLN
478.8000 mg | Freq: Once | INTRAVENOUS | Status: AC
  Administered 2013-08-14: 480 mg via INTRAVENOUS
  Filled 2013-08-14: qty 48

## 2013-08-14 MED ORDER — SODIUM CHLORIDE 0.9 % IV SOLN
Freq: Once | INTRAVENOUS | Status: AC
  Administered 2013-08-14: 11:00:00 via INTRAVENOUS

## 2013-08-14 MED ORDER — INFLUENZA VAC SPLIT QUAD 0.5 ML IM SUSP
0.5000 mL | Freq: Once | INTRAMUSCULAR | Status: AC
  Administered 2013-08-14: 0.5 mL via INTRAMUSCULAR
  Filled 2013-08-14: qty 0.5

## 2013-08-14 MED ORDER — DEXAMETHASONE SODIUM PHOSPHATE 20 MG/5ML IJ SOLN
20.0000 mg | Freq: Once | INTRAMUSCULAR | Status: AC
  Administered 2013-08-14: 20 mg via INTRAVENOUS

## 2013-08-14 NOTE — Progress Notes (Signed)
[facility] [phone]   Fax:(336) [phone]  OFFICE PROGRESS NOTE  No PCP Per Patient [address] [place] [address]  DIAGNOSIS: Metastatic non-small cell lung cancer, squamous cell carcinoma with large left lower lobe lung mass as well as liver metastasis diagnosed in June of 2014   PRIOR THERAPY: Palliative radiotherapy to the left lower lobe obstructing mass under the care of Dr. Roselind Messier   CURRENT THERAPY: Systemic chemotherapy with carboplatin for AUC of 5 and paclitaxel 175 mg/M2 every 3 weeks with Neulasta support. Status post 5 cycles. Starting from cycle #4, his carboplatin would be for AUC of 6 and paclitaxel 200 mg/M2 every 3 weeks   CHEMOTHERAPY INTENT: Palliative  CURRENT # OF CHEMOTHERAPY CYCLES: 5 CURRENT ANTIEMETICS: Zofran, dexamethasone, Compazine  CURRENT SMOKING STATUS: Former smoker, quit 05/14/2013  ORAL CHEMOTHERAPY AND CONSENT: n/a  CURRENT BISPHOSPHONATES USE: none  PAIN MANAGEMENT: none  NARCOTICS INDUCED CONSTIPATION: none  LIVING WILL AND CODE STATUS: no CODE BLUE   INTERVAL HISTORY: Cameron Lam. 79 y.o. male returns to the clinic today for followup visit accompanied by his daughter. The patient is feeling fine today with no specific complaints. He tolerated the last cycle of his systemic chemotherapy with carboplatin and paclitaxel fairly well. The patient denied having any significant nausea or vomiting, no fever or chills. He denied having any peripheral neuropathy. The patient denied having any significant weight loss or night sweats. Has no chest pain, shortness of breath, cough or hemoptysis.   MEDICAL HISTORY: Past Medical History  Diagnosis Date  . Shingles outbreak 06/16/10    Lt anterior and postior thigh/buttocks  . Hypertension   . Tobacco abuse   . Lung cancer   . Hx of radiation therapy 6/26 - 05/07/13    LLL lung 37.5 gray in 15 fractions    ALLERGIES:  has No Known  Allergies.  MEDICATIONS:  Current Outpatient Prescriptions  Medication Sig Dispense Refill  . albuterol (PROVENTIL HFA;VENTOLIN HFA) 108 (90 BASE) MCG/ACT inhaler Inhale 2 puffs into the lungs every 6 (six) hours as needed for wheezing.  1 Inhaler  3  . amLODipine (NORVASC) 10 MG tablet Take 1 tablet (10 mg total) by mouth daily.  30 tablet  0  . aspirin 81 MG tablet Take 1 tablet (81 mg total) by mouth daily.  30 tablet  11  . feeding supplement (ENSURE COMPLETE) LIQD Take 237 mLs by mouth 2 (two) times daily between meals.  60 Bottle  0  . ferrous sulfate 325 (65 FE) MG tablet Take 1 tablet (325 mg total) by mouth 3 (three) times daily with meals.  90 tablet  3  . Multiple Vitamin (MULTIVITAMIN) tablet Take 1 tablet by mouth daily.      . prochlorperazine (COMPAZINE) 10 MG tablet Take 1 tablet (10 mg total) by mouth every 6 (six) hours as needed.  60 tablet  0   No current facility-administered medications for this visit.    SURGICAL HISTORY:  Past Surgical History  Procedure Laterality Date  . Video bronchoscopy Bilateral 04/10/2013    Procedure: VIDEO BRONCHOSCOPY WITH FLUORO;  Surgeon: Merwyn Katos, MD;  Location: [facility];  Service: Cardiopulmonary;  Laterality: Bilateral;    REVIEW OF SYSTEMS:  A comprehensive review of systems was negative.   PHYSICAL EXAMINATION: General appearance: alert, cooperative and no distress Head: Normocephalic, without obvious abnormality, atraumatic Neck: no adenopathy, no JVD, supple, symmetrical, trachea midline and thyroid not enlarged,  symmetric, no tenderness/mass/nodules Lymph nodes: Cervical, supraclavicular, and axillary nodes normal. Resp: clear to auscultation bilaterally Back: symmetric, no curvature. ROM normal. No CVA tenderness. Cardio: regular rate and rhythm, S1, S2 normal, no murmur, click, rub or gallop GI: soft, non-tender; bowel sounds normal; no masses,  no organomegaly Extremities: extremities normal, atraumatic, no  cyanosis or edema  ECOG PERFORMANCE STATUS: 0 - Asymptomatic  Blood pressure 120/64, pulse 100, temperature 97 F (36.1 C), temperature source Oral, resp. rate 20, height 5\' 7"  (1.702 m), weight 136 lb 14.4 oz (62.097 kg).  LABORATORY DATA: Lab Results  Component Value Date   WBC 13.4* 08/14/2013   HGB 10.0* 08/14/2013   HCT 32.3* 08/14/2013   MCV 81.4 08/14/2013   PLT 246 08/14/2013      Chemistry      Component Value Date/Time   NA 141 08/07/2013 1008   NA 140 06/26/2013 0939   K 4.1 08/07/2013 1008   K 3.7 06/26/2013 0939   CL 104 06/26/2013 0939   CO2 26 08/07/2013 1008   CO2 28 06/26/2013 0939   BUN 10.3 08/07/2013 1008   BUN 10 06/26/2013 0939   CREATININE 0.8 08/07/2013 1008   CREATININE 0.78 06/26/2013 0939      Component Value Date/Time   CALCIUM 9.2 08/07/2013 1008   CALCIUM 9.0 06/26/2013 0939   ALKPHOS 146 08/07/2013 1008   ALKPHOS 153* 06/26/2013 0939   AST 19 08/07/2013 1008   AST 20 06/26/2013 0939   ALT 11 08/07/2013 1008   ALT 13 06/26/2013 0939   BILITOT <0.20 08/07/2013 1008   BILITOT 0.1* 06/26/2013 0939       RADIOGRAPHIC STUDIES: No results found.  ASSESSMENT AND PLAN: This is a very pleasant 79 years old African American male with metastatic non-small cell lung cancer, squamous cell carcinoma currently undergoing systemic chemotherapy with carboplatin and paclitaxel is status post 5 cycles. The patient is tolerating his treatment fairly well with no significant adverse effects. We will proceed with cycle #6 today as scheduled.   The patient would come back for followup visit in 3 weeks with repeat CT scan of the chest, abdomen and pelvis for restaging of his disease. He was advised to call immediately if he has any concerning symptoms in the interval. The patient voices understanding of current disease status and treatment options and is in agreement with the current care plan.  All questions were answered. The patient knows to call the clinic with any  problems, questions or concerns. We can certainly see the patient much sooner if necessary.

## 2013-08-14 NOTE — Telephone Encounter (Signed)
Added f/u for 11/13. No further tx orders at this time. S/w Tiffany in central to move ct to 11/10 instead of 11/13 (already on schedule). dtr given appt schedule for October thru December and d/t/prep/instructions for 11/10 ct.

## 2013-08-14 NOTE — Patient Instructions (Signed)
[facility] Discharge Instructions for Patients Receiving Chemotherapy  Today you received the following chemotherapy agents Taxol/Carboplatin To help prevent nausea and vomiting after your treatment, we encourage you to take your nausea medication as prescribed.  If you develop nausea and vomiting that is not controlled by your nausea medication, call the clinic.   BELOW ARE SYMPTOMS THAT SHOULD BE REPORTED IMMEDIATELY:  *FEVER GREATER THAN 100.5 F  *CHILLS WITH OR WITHOUT FEVER  NAUSEA AND VOMITING THAT IS NOT CONTROLLED WITH YOUR NAUSEA MEDICATION  *UNUSUAL SHORTNESS OF BREATH  *UNUSUAL BRUISING OR BLEEDING  TENDERNESS IN MOUTH AND THROAT WITH OR WITHOUT PRESENCE OF ULCERS  *URINARY PROBLEMS  *BOWEL PROBLEMS  UNUSUAL RASH Items with * indicate a potential emergency and should be followed up as soon as possible.  Feel free to call the clinic you have any questions or concerns. The clinic phone number is [phone].    

## 2013-08-14 NOTE — Patient Instructions (Signed)
CURRENT THERAPY: Systemic chemotherapy with carboplatin for AUC of 5 and paclitaxel 175 mg/M2 every 3 weeks with Neulasta support. Status post 5 cycles. Starting from cycle #4, his carboplatin would be for AUC of 6 and paclitaxel 200 mg/M2 every 3 weeks  CHEMOTHERAPY INTENT: Palliative  CURRENT # OF CHEMOTHERAPY CYCLES: 5  CURRENT ANTIEMETICS: Zofran, dexamethasone, Compazine  CURRENT SMOKING STATUS: Former smoker, quit 05/14/2013  ORAL CHEMOTHERAPY AND CONSENT: n/a  CURRENT BISPHOSPHONATES USE: none  PAIN MANAGEMENT: none  NARCOTICS INDUCED CONSTIPATION: none  LIVING WILL AND CODE STATUS: no CODE BLUE

## 2013-08-15 ENCOUNTER — Ambulatory Visit (HOSPITAL_BASED_OUTPATIENT_CLINIC_OR_DEPARTMENT_OTHER): Payer: Medicare Other

## 2013-08-15 VITALS — BP 108/70 | HR 97 | Temp 97.2°F

## 2013-08-15 DIAGNOSIS — C787 Secondary malignant neoplasm of liver and intrahepatic bile duct: Secondary | ICD-10-CM

## 2013-08-15 DIAGNOSIS — C3430 Malignant neoplasm of lower lobe, unspecified bronchus or lung: Secondary | ICD-10-CM

## 2013-08-15 DIAGNOSIS — C3492 Malignant neoplasm of unspecified part of left bronchus or lung: Secondary | ICD-10-CM

## 2013-08-15 MED ORDER — PEGFILGRASTIM INJECTION 6 MG/0.6ML
6.0000 mg | Freq: Once | SUBCUTANEOUS | Status: AC
  Administered 2013-08-15: 6 mg via SUBCUTANEOUS
  Filled 2013-08-15: qty 0.6

## 2013-08-21 ENCOUNTER — Other Ambulatory Visit (HOSPITAL_BASED_OUTPATIENT_CLINIC_OR_DEPARTMENT_OTHER): Payer: Medicare Other

## 2013-08-21 DIAGNOSIS — C3490 Malignant neoplasm of unspecified part of unspecified bronchus or lung: Secondary | ICD-10-CM

## 2013-08-21 DIAGNOSIS — C3430 Malignant neoplasm of lower lobe, unspecified bronchus or lung: Secondary | ICD-10-CM

## 2013-08-21 LAB — CBC WITH DIFFERENTIAL/PLATELET
BASO%: 0.2 % (ref 0.0–2.0)
Eosinophils Absolute: 0.1 10*3/uL (ref 0.0–0.5)
HCT: 27.3 % — ABNORMAL LOW (ref 38.4–49.9)
LYMPH%: 24.7 % (ref 14.0–49.0)
MCHC: 31.5 g/dL — ABNORMAL LOW (ref 32.0–36.0)
MCV: 78.7 fL — ABNORMAL LOW (ref 79.3–98.0)
MONO#: 2 10*3/uL — ABNORMAL HIGH (ref 0.1–0.9)
NEUT%: 68.8 % (ref 39.0–75.0)
Platelets: 180 10*3/uL (ref 140–400)
RBC: 3.47 10*6/uL — ABNORMAL LOW (ref 4.20–5.82)
WBC: 32.6 10*3/uL — ABNORMAL HIGH (ref 4.0–10.3)

## 2013-08-21 LAB — COMPREHENSIVE METABOLIC PANEL (CC13)
Anion Gap: 10 mEq/L (ref 3–11)
BUN: 8.4 mg/dL (ref 7.0–26.0)
CO2: 20 mEq/L — ABNORMAL LOW (ref 22–29)
Chloride: 106 mEq/L (ref 98–109)
Creatinine: 0.8 mg/dL (ref 0.7–1.3)
Glucose: 116 mg/dl (ref 70–140)
Total Bilirubin: 0.21 mg/dL (ref 0.20–1.20)

## 2013-08-28 ENCOUNTER — Other Ambulatory Visit (HOSPITAL_BASED_OUTPATIENT_CLINIC_OR_DEPARTMENT_OTHER): Payer: Medicare Other | Admitting: Lab

## 2013-08-28 DIAGNOSIS — C3492 Malignant neoplasm of unspecified part of left bronchus or lung: Secondary | ICD-10-CM

## 2013-08-28 DIAGNOSIS — C3430 Malignant neoplasm of lower lobe, unspecified bronchus or lung: Secondary | ICD-10-CM

## 2013-08-28 DIAGNOSIS — C787 Secondary malignant neoplasm of liver and intrahepatic bile duct: Secondary | ICD-10-CM

## 2013-08-28 LAB — CBC WITH DIFFERENTIAL/PLATELET
Basophils Absolute: 0.1 10*3/uL (ref 0.0–0.1)
Eosinophils Absolute: 0.1 10*3/uL (ref 0.0–0.5)
HCT: 30.5 % — ABNORMAL LOW (ref 38.4–49.9)
HGB: 9.3 g/dL — ABNORMAL LOW (ref 13.0–17.1)
MONO#: 1.2 10*3/uL — ABNORMAL HIGH (ref 0.1–0.9)
NEUT#: 11.6 10*3/uL — ABNORMAL HIGH (ref 1.5–6.5)
NEUT%: 57.9 % (ref 39.0–75.0)
WBC: 20.1 10*3/uL — ABNORMAL HIGH (ref 4.0–10.3)
lymph#: 7.1 10*3/uL — ABNORMAL HIGH (ref 0.9–3.3)

## 2013-08-28 LAB — COMPREHENSIVE METABOLIC PANEL (CC13)
ALT: 11 U/L (ref 0–55)
Albumin: 3.7 g/dL (ref 3.5–5.0)
Anion Gap: 10 mEq/L (ref 3–11)
BUN: 10.7 mg/dL (ref 7.0–26.0)
CO2: 23 mEq/L (ref 22–29)
Calcium: 9.4 mg/dL (ref 8.4–10.4)
Chloride: 105 mEq/L (ref 98–109)
Creatinine: 0.8 mg/dL (ref 0.7–1.3)
Glucose: 79 mg/dl (ref 70–140)
Total Bilirubin: 0.25 mg/dL (ref 0.20–1.20)

## 2013-08-29 ENCOUNTER — Encounter: Payer: Self-pay | Admitting: Internal Medicine

## 2013-08-29 NOTE — Progress Notes (Signed)
Ct chest @ [facility] 09/01/13 [id number] ap [id number] 08/29/13-10/13/13.

## 2013-09-01 ENCOUNTER — Encounter (HOSPITAL_COMMUNITY): Payer: Self-pay

## 2013-09-01 ENCOUNTER — Ambulatory Visit (HOSPITAL_COMMUNITY)
Admission: RE | Admit: 2013-09-01 | Discharge: 2013-09-01 | Disposition: A | Discharge status: Home / Self Care | Payer: Medicare Other | Source: Ambulatory Visit | Attending: Internal Medicine | Admitting: Internal Medicine

## 2013-09-01 DIAGNOSIS — D739 Disease of spleen, unspecified: Secondary | ICD-10-CM | POA: Insufficient documentation

## 2013-09-01 DIAGNOSIS — C3490 Malignant neoplasm of unspecified part of unspecified bronchus or lung: Secondary | ICD-10-CM | POA: Insufficient documentation

## 2013-09-01 DIAGNOSIS — R918 Other nonspecific abnormal finding of lung field: Secondary | ICD-10-CM | POA: Insufficient documentation

## 2013-09-01 DIAGNOSIS — K7689 Other specified diseases of liver: Secondary | ICD-10-CM | POA: Insufficient documentation

## 2013-09-01 DIAGNOSIS — K4090 Unilateral inguinal hernia, without obstruction or gangrene, not specified as recurrent: Secondary | ICD-10-CM | POA: Insufficient documentation

## 2013-09-01 MED ORDER — IOHEXOL 300 MG/ML  SOLN
100.0000 mL | Freq: Once | INTRAMUSCULAR | Status: AC | PRN
  Administered 2013-09-01: 100 mL via INTRAVENOUS

## 2013-09-04 ENCOUNTER — Encounter: Payer: Self-pay | Admitting: Internal Medicine

## 2013-09-04 ENCOUNTER — Other Ambulatory Visit (HOSPITAL_BASED_OUTPATIENT_CLINIC_OR_DEPARTMENT_OTHER): Payer: Medicare Other | Admitting: Lab

## 2013-09-04 ENCOUNTER — Inpatient Hospital Stay (HOSPITAL_COMMUNITY)
Admission: RE | Admit: 2013-09-04 | Discharge: 2013-09-04 | Disposition: A | Discharge status: Home / Self Care | Payer: Medicare Other | Source: Ambulatory Visit | Attending: Internal Medicine | Admitting: Internal Medicine

## 2013-09-04 ENCOUNTER — Ambulatory Visit (HOSPITAL_BASED_OUTPATIENT_CLINIC_OR_DEPARTMENT_OTHER): Payer: Medicare Other | Admitting: Internal Medicine

## 2013-09-04 VITALS — BP 122/80 | HR 109 | Temp 98.2°F | Resp 18 | Ht 67.0 in | Wt 136.5 lb

## 2013-09-04 DIAGNOSIS — C3430 Malignant neoplasm of lower lobe, unspecified bronchus or lung: Secondary | ICD-10-CM

## 2013-09-04 DIAGNOSIS — C787 Secondary malignant neoplasm of liver and intrahepatic bile duct: Secondary | ICD-10-CM

## 2013-09-04 DIAGNOSIS — C3490 Malignant neoplasm of unspecified part of unspecified bronchus or lung: Secondary | ICD-10-CM

## 2013-09-04 DIAGNOSIS — C3492 Malignant neoplasm of unspecified part of left bronchus or lung: Secondary | ICD-10-CM

## 2013-09-04 LAB — CBC WITH DIFFERENTIAL/PLATELET
BASO%: 0.8 % (ref 0.0–2.0)
Basophils Absolute: 0.1 10*3/uL (ref 0.0–0.1)
EOS%: 0.4 % (ref 0.0–7.0)
HGB: 9.4 g/dL — ABNORMAL LOW (ref 13.0–17.1)
MCH: 24.2 pg — ABNORMAL LOW (ref 27.2–33.4)
MCHC: 31 g/dL — ABNORMAL LOW (ref 32.0–36.0)
Platelets: 290 10*3/uL (ref 140–400)
RBC: 3.87 10*6/uL — ABNORMAL LOW (ref 4.20–5.82)
RDW: 18.4 % — ABNORMAL HIGH (ref 11.0–14.6)
lymph#: 4.9 10*3/uL — ABNORMAL HIGH (ref 0.9–3.3)

## 2013-09-04 LAB — COMPREHENSIVE METABOLIC PANEL (CC13)
ALT: 9 U/L (ref 0–55)
AST: 18 U/L (ref 5–34)
Albumin: 3.7 g/dL (ref 3.5–5.0)
Anion Gap: 8 mEq/L (ref 3–11)
BUN: 12.6 mg/dL (ref 7.0–26.0)
Calcium: 9.2 mg/dL (ref 8.4–10.4)
Chloride: 106 mEq/L (ref 98–109)
Creatinine: 0.8 mg/dL (ref 0.7–1.3)
Potassium: 3.8 mEq/L (ref 3.5–5.1)
Sodium: 139 mEq/L (ref 136–145)
Total Protein: 6.6 g/dL (ref 6.4–8.3)

## 2013-09-04 NOTE — Progress Notes (Signed)
[facility] [phone]   [phone]  OFFICE PROGRESS NOTE  No PCP Per Patient [address] [place] [address]  DIAGNOSIS: Metastatic non-small cell lung cancer, squamous cell carcinoma with large left lower lobe lung mass as well as liver metastasis diagnosed in June of 2014   PRIOR THERAPY:  1) Palliative radiotherapy to the left lower lobe obstructing mass under the care of Dr. Roselind Messier. 2) Systemic chemotherapy with carboplatin for AUC of 5 and paclitaxel 175 mg/M2 every 3 weeks with Neulasta support. Status post 6 cycles, as was given 08/18/2013 discontinued secondary to disease progression. Starting from cycle #4, his carboplatin would be for AUC of 6 and paclitaxel 200 mg/M2 every 3 weeks    CURRENT THERAPY: gemcitabine 1000 mg/M2 on days 1 and 8 every 3 weeks.first cycle on 09/22/2013.  CHEMOTHERAPY INTENT: Palliative  CURRENT # OF CHEMOTHERAPY CYCLES: 1 CURRENT ANTIEMETICS: Zofran, dexamethasone, Compazine  CURRENT SMOKING STATUS: Former smoker, quit 05/14/2013  ORAL CHEMOTHERAPY AND CONSENT: n/a  CURRENT BISPHOSPHONATES USE: none  PAIN MANAGEMENT: none  NARCOTICS INDUCED CONSTIPATION: none  LIVING WILL AND CODE STATUS: no CODE BLUE   INTERVAL HISTORY: [name]. 79 y.o. male returns to the clinic today for followup visit accompanied by his daughter. The patient is feeling fine today with no specific complaints. He tolerated the last cycle of his systemic chemotherapy with carboplatin and paclitaxel fairly well. The patient denied having any significant nausea or vomiting, no fever or chills. He denied having any peripheral neuropathy. The patient denied having any significant weight loss or night sweats. Has no chest pain, shortness of breath, cough or hemoptysis. He had repeat CT scan of the chest, abdomen and pelvis performed recently and he is here for evaluation and discussion of his scan results.   MEDICAL  HISTORY: Past Medical History  Diagnosis Date  . Shingles outbreak 06/16/10    Lt anterior and postior thigh/buttocks  . Hypertension   . Tobacco abuse   . Lung cancer   . Hx of radiation therapy 6/26 - 05/07/13    LLL lung 37.5 gray in 15 fractions    ALLERGIES:  has No Known Allergies.  MEDICATIONS:  Current Outpatient Prescriptions  Medication Sig Dispense Refill  . albuterol (PROVENTIL HFA;VENTOLIN HFA) 108 (90 BASE) MCG/ACT inhaler Inhale 2 puffs into the lungs every 6 (six) hours as needed for wheezing.  1 Inhaler  3  . amLODipine (NORVASC) 10 MG tablet Take 1 tablet (10 mg total) by mouth daily.  30 tablet  0  . aspirin 81 MG tablet Take 1 tablet (81 mg total) by mouth daily.  30 tablet  11  . feeding supplement (ENSURE COMPLETE) LIQD Take 237 mLs by mouth 2 (two) times daily between meals.  60 Bottle  0  . ferrous sulfate 325 (65 FE) MG tablet Take 1 tablet (325 mg total) by mouth 3 (three) times daily with meals.  90 tablet  3  . Multiple Vitamin (MULTIVITAMIN) tablet Take 1 tablet by mouth daily.      . prochlorperazine (COMPAZINE) 10 MG tablet Take 1 tablet (10 mg total) by mouth every 6 (six) hours as needed.  60 tablet  0   No current facility-administered medications for this visit.    SURGICAL HISTORY:  Past Surgical History  Procedure Laterality Date  . Video bronchoscopy Bilateral 04/10/2013    Procedure: VIDEO BRONCHOSCOPY WITH FLUORO;  Surgeon: Merwyn Katos, MD;  Location:  [facility];  Service: Cardiopulmonary;  Laterality: Bilateral;    REVIEW OF SYSTEMS:  Constitutional: negative Eyes: negative Ears, nose, mouth, throat, and face: negative Respiratory: negative Cardiovascular: negative Gastrointestinal: negative Genitourinary:negative Integument/breast: negative Hematologic/lymphatic: negative Musculoskeletal:negative Neurological: negative Behavioral/Psych: negative Endocrine: negative Allergic/Immunologic: negative   PHYSICAL EXAMINATION:  General appearance: alert, cooperative and no distress Head: Normocephalic, without obvious abnormality, atraumatic Neck: no adenopathy, no JVD, supple, symmetrical, trachea midline and thyroid not enlarged, symmetric, no tenderness/mass/nodules Lymph nodes: Cervical, supraclavicular, and axillary nodes normal. Resp: clear to auscultation bilaterally Back: symmetric, no curvature. ROM normal. No CVA tenderness. Cardio: regular rate and rhythm, S1, S2 normal, no murmur, click, rub or gallop GI: soft, non-tender; bowel sounds normal; no masses,  no organomegaly Extremities: extremities normal, atraumatic, no cyanosis or edema Neurologic: Alert and oriented X 3, normal strength and tone. Normal symmetric reflexes. Normal coordination and gait  ECOG PERFORMANCE STATUS: 0 - Asymptomatic  Blood pressure 122/80, pulse 109, temperature 98.2 F (36.8 C), temperature source Oral, resp. rate 18, height 5\' 7"  (1.702 m), weight 136 lb 8 oz (61.916 kg).  LABORATORY DATA: Lab Results  Component Value Date   WBC 10.6* 09/04/2013   HGB 9.4* 09/04/2013   HCT 30.3* 09/04/2013   MCV 78.1* 09/04/2013   PLT 290 09/04/2013      Chemistry      Component Value Date/Time   NA 139 09/04/2013 0841   NA 140 06/26/2013 0939   K 3.8 09/04/2013 0841   K 3.7 06/26/2013 0939   CL 104 06/26/2013 0939   CO2 25 09/04/2013 0841   CO2 28 06/26/2013 0939   BUN 12.6 09/04/2013 0841   BUN 10 06/26/2013 0939   CREATININE 0.8 09/04/2013 0841   CREATININE 0.78 06/26/2013 0939      Component Value Date/Time   CALCIUM 9.2 09/04/2013 0841   CALCIUM 9.0 06/26/2013 0939   ALKPHOS 106 09/04/2013 0841   ALKPHOS 153* 06/26/2013 0939   AST 18 09/04/2013 0841   AST 20 06/26/2013 0939   ALT 9 09/04/2013 0841   ALT 13 06/26/2013 0939   BILITOT 0.28 09/04/2013 0841   BILITOT 0.1* 06/26/2013 0939       RADIOGRAPHIC STUDIES: Ct Chest W Contrast  09/04/2013   CLINICAL DATA:  Squamous cell carcinoma lung.  EXAM: CT CHEST, ABDOMEN, AND  PELVIS WITH CONTRAST  TECHNIQUE: Multidetector CT imaging of the chest, abdomen and pelvis was performed following the standard protocol during bolus administration of intravenous contrast.  CONTRAST:  OMNIPAQUE IOHEXOL 300 MG/ML SOLN  COMPARISON:  CT ABD/PELVIS W CM dated 06/30/2013; CT ABD/PELVIS W CM dated 04/09/2013;   CT CHEST W/CM dated 04/04/2013  FINDINGS: CT CHEST FINDINGS  In the medial aspect of the left lower lobe, there is new cavitation at site of previous mass like pulmonary thickening measuring 15 x 24 mm (image 36) compared to 15 x 33 mm on prior. This is a site of previous consolidative mass on CT of 04/04/2013. Nodule inferior to this cavitation measures 10 mm (image 44) which was at site of increase consolidation on comparison exam which is lessened. There is bullous change the upper lobes. Stable small left apical nodule noted. Within the central right upper lobe 8 mm nodule (image 29) is decreased from 10 mm on prior.    CT ABDOMEN AND PELVIS FINDINGS  Within the posterior aspect of the right hepatic lobe there is a 2.6 x 3.8 cm subcapsular lesion which is increased from 2.2 x 3.2 cm  on prior. Lesion within the medial left hepatic lobe measuring 9 mm is not increased in size. Gallbladder and pancreas are normal. Low-attenuation lesion in the medial aspect of the spleen measures 28 mm in maximal dimension compared to 29 on prior. Mild thickening of the adrenal glands is similar prior. Kidneys are normal.  The stomach, small bowel, and cecum are normal. The colon and rectosigmoid colon are normal. There is a moderate volume stool throughout the colon.  Abdominal or is normal caliber. There are several prominent central mesenteric lymph nodes less than 1 cm short axis (image 86). These lymph nodes are similar to comparison exam.  There is no pelvic lymphadenopathy. Prostate gland is normal. A portion of the bladder extends into a right inguinal hernia. No aggressive osseous lesion.     IMPRESSION: 1. Enlargement of right hepatic lobe subcapsular lesion is most consistent with progression of hepatic metastasis. 2. Interval decrease in volume of medial left lower lobe consolidation. There is new cavitation within this consolidative process which warrants close attention on follow-up. 3. Additional pulmonary nodules are stable or decreased. 4. Medial splenic lesion is stable and indeterminate.   Electronically Signed   By: Genevive Bi M.D.   On: 09/04/2013 08:19    ASSESSMENT AND PLAN: This is a very pleasant 79 years old [place] American male with metastatic non-small cell lung cancer, squamous cell carcinoma currently undergoing systemic chemotherapy with carboplatin and paclitaxel status post 6 cycles.  Unfortunately the recent CT scan of the chest, abdomen and pelvis showed evidence for disease progression especially in the liver. I discussed the scan results and showed the images to the patient and his daughter. I recommended for him to discontinue treatment with carboplatin and paclitaxel at this point. I discussed with the patient other treatment options including second line treatment with single agent gemcitabine 1000 mg/M2 days 1 and 8 versus palliative care and hospice referral. The patient is interested in proceeding with systemic chemotherapy. I discussed the adverse effect of this treatment with the patient and his daughter including but not limited to alopecia, myelosuppression, nausea and vomiting, liver or renal dysfunction. The patient would like to proceed with treatment as planned but he would like to delay the start of this course of treatment until after Thanksgiving. He would come back for follow up visit in 2 weeks with the start of the first cycle of his treatment.  He was advised to call immediately if he has any concerning symptoms in the interval. The patient voices understanding of current disease status and treatment options and is in agreement with the  current care plan.  All questions were answered. The patient knows to call the clinic with any problems, questions or concerns. We can certainly see the patient much sooner if necessary.

## 2013-09-05 ENCOUNTER — Telehealth: Payer: Self-pay | Admitting: Internal Medicine

## 2013-09-05 ENCOUNTER — Telehealth: Payer: Self-pay | Admitting: *Deleted

## 2013-09-05 NOTE — Telephone Encounter (Signed)
Per staff message and POF I have scheduled appts.  JMW  

## 2013-09-05 NOTE — Telephone Encounter (Signed)
Dr Arbutus Ped replied OK to email about adjusting tx dates per pt request fwd his email to [facility] shh

## 2013-09-05 NOTE — Telephone Encounter (Signed)
LVMM for dgt to call me directly to go over schedules for DAD shh

## 2013-09-06 NOTE — Patient Instructions (Signed)
CURRENT THERAPY: gemcitabine 1000 mg/M2 on days 1 and 8 every 3 weeks.first cycle on 09/22/2013.  CHEMOTHERAPY INTENT: Palliative  CURRENT # OF CHEMOTHERAPY CYCLES: 1  CURRENT ANTIEMETICS: Zofran, dexamethasone, Compazine  CURRENT SMOKING STATUS: Former smoker, quit 05/14/2013  ORAL CHEMOTHERAPY AND CONSENT: n/a  CURRENT BISPHOSPHONATES USE: none  PAIN MANAGEMENT: none  NARCOTICS INDUCED CONSTIPATION: none  LIVING WILL AND CODE STATUS: no CODE BLUE  

## 2013-09-08 ENCOUNTER — Other Ambulatory Visit: Payer: Self-pay | Admitting: Internal Medicine

## 2013-09-09 ENCOUNTER — Telehealth: Payer: Self-pay | Admitting: *Deleted

## 2013-09-09 ENCOUNTER — Telehealth: Payer: Self-pay | Admitting: Medical Oncology

## 2013-09-09 NOTE — Telephone Encounter (Signed)
Per scheduler I h ave moved appts

## 2013-09-09 NOTE — Telephone Encounter (Signed)
erroneous

## 2013-09-10 ENCOUNTER — Telehealth: Payer: Self-pay | Admitting: Internal Medicine

## 2013-09-10 ENCOUNTER — Telehealth: Payer: Self-pay | Admitting: *Deleted

## 2013-09-10 NOTE — Telephone Encounter (Signed)
Per scheduler the patient's daughter request to move the treatment dates. Appts moved

## 2013-09-10 NOTE — Telephone Encounter (Signed)
worked w Psychiatric nurse and MW and Dr Arbutus Ped to get sch like pt desires SW Dgt went over sch Mailed Cal to pt shh

## 2013-09-11 ENCOUNTER — Other Ambulatory Visit (HOSPITAL_BASED_OUTPATIENT_CLINIC_OR_DEPARTMENT_OTHER): Payer: Medicare Other | Admitting: Lab

## 2013-09-11 DIAGNOSIS — C3430 Malignant neoplasm of lower lobe, unspecified bronchus or lung: Secondary | ICD-10-CM

## 2013-09-11 DIAGNOSIS — C3492 Malignant neoplasm of unspecified part of left bronchus or lung: Secondary | ICD-10-CM

## 2013-09-11 DIAGNOSIS — C787 Secondary malignant neoplasm of liver and intrahepatic bile duct: Secondary | ICD-10-CM

## 2013-09-11 LAB — COMPREHENSIVE METABOLIC PANEL (CC13)
AST: 20 U/L (ref 5–34)
Albumin: 4 g/dL (ref 3.5–5.0)
Alkaline Phosphatase: 95 U/L (ref 40–150)
Potassium: 3.9 mEq/L (ref 3.5–5.1)
Sodium: 138 mEq/L (ref 136–145)
Total Bilirubin: 0.38 mg/dL (ref 0.20–1.20)
Total Protein: 7 g/dL (ref 6.4–8.3)

## 2013-09-11 LAB — CBC WITH DIFFERENTIAL/PLATELET
EOS%: 1.3 % (ref 0.0–7.0)
Eosinophils Absolute: 0.1 10*3/uL (ref 0.0–0.5)
LYMPH%: 54.1 % — ABNORMAL HIGH (ref 14.0–49.0)
MCH: 23.5 pg — ABNORMAL LOW (ref 27.2–33.4)
MCHC: 30.8 g/dL — ABNORMAL LOW (ref 32.0–36.0)
MCV: 76.4 fL — ABNORMAL LOW (ref 79.3–98.0)
MONO%: 7.6 % (ref 0.0–14.0)
NEUT#: 3.5 10*3/uL (ref 1.5–6.5)
Platelets: 206 10*3/uL (ref 140–400)
RBC: 4.38 10*6/uL (ref 4.20–5.82)
RDW: 18.6 % — ABNORMAL HIGH (ref 11.0–14.6)
lymph#: 5.3 10*3/uL — ABNORMAL HIGH (ref 0.9–3.3)

## 2013-09-11 LAB — TECHNOLOGIST REVIEW

## 2013-09-17 ENCOUNTER — Encounter: Payer: Self-pay | Admitting: Family Medicine

## 2013-09-17 ENCOUNTER — Ambulatory Visit (INDEPENDENT_AMBULATORY_CARE_PROVIDER_SITE_OTHER): Payer: Medicare Other | Admitting: Family Medicine

## 2013-09-17 VITALS — BP 122/82 | HR 95 | Temp 97.9°F | Resp 16 | Ht 68.0 in | Wt 137.2 lb

## 2013-09-17 DIAGNOSIS — I10 Essential (primary) hypertension: Secondary | ICD-10-CM

## 2013-09-17 MED ORDER — AMLODIPINE BESYLATE 10 MG PO TABS: 10.0000 mg | ORAL_TABLET | Freq: Every day | ORAL | Status: AC

## 2013-09-17 NOTE — Progress Notes (Signed)
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 No significant shortness of breath.  Energy good.  Appetite good, sleep good.  Objective:  NAD  Results for orders placed in visit on 09/11/13  TECHNOLOGIST REVIEW      Result Value Range   Technologist Review Few Variant lymphs present    CBC WITH DIFFERENTIAL      Result Value Range   WBC 9.9  4.0 - 10.3 10e3/uL   NEUT# 3.5  1.5 - 6.5 10e3/uL   HGB 10.3 (*) 13.0 - 17.1 g/dL   HCT 16.1 (*) 09.6 - 04.5 %   Platelets 206  140 - 400 10e3/uL   MCV 76.4 (*) 79.3 - 98.0 fL   MCH 23.5 (*) 27.2 - 33.4 pg   MCHC 30.8 (*) 32.0 - 36.0 g/dL   RBC 4.09  8.11 - 9.14 10e6/uL   RDW 18.6 (*) 11.0 - 14.6 %   lymph# 5.3 (*) 0.9 - 3.3 10e3/uL   MONO# 0.8  0.1 - 0.9 10e3/uL   Eosinophils Absolute 0.1  0.0 - 0.5 10e3/uL   Basophils Absolute 0.2 (*) 0.0 - 0.1 10e3/uL   NEUT% 35.2 (*) 39.0 - 75.0 %   LYMPH% 54.1 (*) 14.0 - 49.0 %   MONO% 7.6  0.0 - 14.0 %   EOS% 1.3  0.0 - 7.0 %   BASO% 1.8  0.0 - 2.0 %  COMPREHENSIVE METABOLIC PANEL (CC13)      Result Value Range   Sodium 138  136 - 145 mEq/L   Potassium 3.9  3.5 - 5.1 mEq/L   Chloride 104  98 - 109 mEq/L   CO2 23  22 - 29 mEq/L   Glucose 88  70 - 140 mg/dl   BUN 78.2  7.0 - 95.6 mg/dL   Creatinine 0.8  0.7 - 1.3 mg/dL   Total Bilirubin 2.13  0.20 - 1.20 mg/dL   Alkaline Phosphatase 95  40 - 150 U/L   AST 20  5 - 34 U/L   ALT 10  0 - 55 U/L   Total Protein 7.0  6.4 - 8.3 g/dL   Albumin 4.0  3.5 - 5.0 g/dL   Calcium 9.6  8.4 - 08.6 mg/dL   Anion Gap 11  3 - 11 mEq/L   HEENT:  Edentulous with false teeth Neck: supple, no adeno Chest: few wet ronchi Heart:  Reg, no murmur Abdomen:  Soft, nontender without mass palpated Skin: mild pallor   Hypertension - Plan: amLODipine (NORVASC) 10 MG tablet  Signed, Elvina Sidle, MD

## 2013-09-19 ENCOUNTER — Other Ambulatory Visit: Payer: Medicare Other | Admitting: Lab

## 2013-09-22 ENCOUNTER — Other Ambulatory Visit: Payer: Medicare Other | Admitting: Lab

## 2013-09-25 ENCOUNTER — Ambulatory Visit: Payer: Medicare Other

## 2013-09-25 ENCOUNTER — Other Ambulatory Visit: Payer: Medicare Other | Admitting: Lab

## 2013-09-29 ENCOUNTER — Ambulatory Visit: Payer: Medicare Other

## 2013-09-29 ENCOUNTER — Other Ambulatory Visit: Payer: Medicare Other | Admitting: Lab

## 2013-10-02 ENCOUNTER — Ambulatory Visit (HOSPITAL_BASED_OUTPATIENT_CLINIC_OR_DEPARTMENT_OTHER): Payer: Medicare Other

## 2013-10-02 ENCOUNTER — Other Ambulatory Visit: Payer: Medicare Other | Admitting: Lab

## 2013-10-02 ENCOUNTER — Ambulatory Visit (HOSPITAL_BASED_OUTPATIENT_CLINIC_OR_DEPARTMENT_OTHER): Payer: Medicare Other | Admitting: Physician Assistant

## 2013-10-02 ENCOUNTER — Other Ambulatory Visit (HOSPITAL_BASED_OUTPATIENT_CLINIC_OR_DEPARTMENT_OTHER): Payer: Medicare Other

## 2013-10-02 VITALS — BP 129/75 | HR 92 | Temp 98.3°F | Resp 18 | Ht 68.0 in | Wt 138.1 lb

## 2013-10-02 DIAGNOSIS — C3430 Malignant neoplasm of lower lobe, unspecified bronchus or lung: Secondary | ICD-10-CM

## 2013-10-02 DIAGNOSIS — C3492 Malignant neoplasm of unspecified part of left bronchus or lung: Secondary | ICD-10-CM

## 2013-10-02 DIAGNOSIS — C787 Secondary malignant neoplasm of liver and intrahepatic bile duct: Secondary | ICD-10-CM

## 2013-10-02 DIAGNOSIS — Z5111 Encounter for antineoplastic chemotherapy: Secondary | ICD-10-CM

## 2013-10-02 DIAGNOSIS — C3490 Malignant neoplasm of unspecified part of unspecified bronchus or lung: Secondary | ICD-10-CM

## 2013-10-02 LAB — CBC WITH DIFFERENTIAL/PLATELET
BASO%: 0.6 % (ref 0.0–2.0)
Basophils Absolute: 0 10*3/uL (ref 0.0–0.1)
HCT: 32.4 % — ABNORMAL LOW (ref 38.4–49.9)
HGB: 9.8 g/dL — ABNORMAL LOW (ref 13.0–17.1)
LYMPH%: 51.9 % — ABNORMAL HIGH (ref 14.0–49.0)
MCH: 22.5 pg — ABNORMAL LOW (ref 27.2–33.4)
MCHC: 30.2 g/dL — ABNORMAL LOW (ref 32.0–36.0)
MONO#: 0.5 10*3/uL (ref 0.1–0.9)
NEUT%: 38.1 % — ABNORMAL LOW (ref 39.0–75.0)
Platelets: 207 10*3/uL (ref 140–400)
RBC: 4.36 10*6/uL (ref 4.20–5.82)
WBC: 6.9 10*3/uL (ref 4.0–10.3)
lymph#: 3.6 10*3/uL — ABNORMAL HIGH (ref 0.9–3.3)

## 2013-10-02 LAB — COMPREHENSIVE METABOLIC PANEL (CC13)
ALT: 13 U/L (ref 0–55)
Anion Gap: 10 mEq/L (ref 3–11)
BUN: 11.1 mg/dL (ref 7.0–26.0)
CO2: 22 mEq/L (ref 22–29)
Calcium: 9.2 mg/dL (ref 8.4–10.4)
Chloride: 107 mEq/L (ref 98–109)
Creatinine: 0.8 mg/dL (ref 0.7–1.3)
Glucose: 90 mg/dl (ref 70–140)
Sodium: 139 mEq/L (ref 136–145)
Total Bilirubin: 0.31 mg/dL (ref 0.20–1.20)

## 2013-10-02 MED ORDER — PROCHLORPERAZINE MALEATE 10 MG PO TABS
ORAL_TABLET | ORAL | Status: AC
  Filled 2013-10-02: qty 1

## 2013-10-02 MED ORDER — PROCHLORPERAZINE MALEATE 10 MG PO TABS
10.0000 mg | ORAL_TABLET | Freq: Once | ORAL | Status: AC
  Administered 2013-10-02: 10 mg via ORAL

## 2013-10-02 MED ORDER — SODIUM CHLORIDE 0.9 % IV SOLN
1000.0000 mg/m2 | Freq: Once | INTRAVENOUS | Status: AC
  Administered 2013-10-02: 1710 mg via INTRAVENOUS
  Filled 2013-10-02: qty 44.97

## 2013-10-02 MED ORDER — SODIUM CHLORIDE 0.9 % IV SOLN
Freq: Once | INTRAVENOUS | Status: AC
  Administered 2013-10-02: 10:00:00 via INTRAVENOUS

## 2013-10-02 NOTE — Patient Instructions (Signed)
[facility] Discharge Instructions for Patients Receiving Chemotherapy  Today you received the following chemotherapy agents:  Gemzar  To help prevent nausea and vomiting after your treatment, we encourage you to take your nausea medication as ordered per MD.   If you develop nausea and vomiting that is not controlled by your nausea medication, call the clinic.   BELOW ARE SYMPTOMS THAT SHOULD BE REPORTED IMMEDIATELY:  *FEVER GREATER THAN 100.5 F  *CHILLS WITH OR WITHOUT FEVER  NAUSEA AND VOMITING THAT IS NOT CONTROLLED WITH YOUR NAUSEA MEDICATION  *UNUSUAL SHORTNESS OF BREATH  *UNUSUAL BRUISING OR BLEEDING  TENDERNESS IN MOUTH AND THROAT WITH OR WITHOUT PRESENCE OF ULCERS  *URINARY PROBLEMS  *BOWEL PROBLEMS  UNUSUAL RASH Items with * indicate a potential emergency and should be followed up as soon as possible.  Feel free to call the clinic you have any questions or concerns. The clinic phone number is [phone].    

## 2013-10-03 ENCOUNTER — Telehealth: Payer: Self-pay | Admitting: *Deleted

## 2013-10-03 NOTE — Progress Notes (Signed)
[facility] Telephone:(336) [phone]   Fax:(336) [phone]  OFFICE PROGRESS NOTE  No PCP Per Patient [address] [place] [address]  DIAGNOSIS: Metastatic non-small cell lung cancer, squamous cell carcinoma with large left lower lobe lung mass as well as liver metastasis diagnosed in June of 2014   PRIOR THERAPY:  1) Palliative radiotherapy to the left lower lobe obstructing mass under the care of Dr. Roselind Messier. 2) Systemic chemotherapy with carboplatin for AUC of 5 and paclitaxel 175 mg/M2 every 3 weeks with Neulasta support. Status post 6 cycles, as was given 08/18/2013 discontinued secondary to disease progression. Starting from cycle #4, his carboplatin would be for AUC of 6 and paclitaxel 200 mg/M2 every 3 weeks    CURRENT THERAPY: gemcitabine 1000 mg/M2 on days 1 and 8 every 3 weeks.first cycle on 10/02/2013.  CHEMOTHERAPY INTENT: Palliative  CURRENT # OF CHEMOTHERAPY CYCLES: 1 CURRENT ANTIEMETICS: Zofran, dexamethasone, Compazine  CURRENT SMOKING STATUS: Former smoker, quit 05/14/2013  ORAL CHEMOTHERAPY AND CONSENT: n/a  CURRENT BISPHOSPHONATES USE: none  PAIN MANAGEMENT: none  NARCOTICS INDUCED CONSTIPATION: none  LIVING WILL AND CODE STATUS: no CODE BLUE   INTERVAL HISTORY: Cameron Lam. 79 y.o. male returns to the clinic today for followup visit accompanied by his daughter. The patient is feeling fine today with no specific complaints. He presents to proceed with his first cycle of systemic chemotherapy with gemcitabine. He continues to have a good appetite and eat well. He is sleeping well and continues to work.  The patient denied having any significant nausea or vomiting, no fever or chills. He denied having any peripheral neuropathy. The patient denied having any significant weight loss or night sweats. Has no chest pain, shortness of breath, cough or hemoptysis.   MEDICAL HISTORY: Past Medical History  Diagnosis Date  . Shingles outbreak  06/16/10    Lt anterior and postior thigh/buttocks  . Hypertension   . Tobacco abuse   . Lung cancer   . Hx of radiation therapy 6/26 - 05/07/13    LLL lung 37.5 gray in 15 fractions    ALLERGIES:  has No Known Allergies.  MEDICATIONS:  Current Outpatient Prescriptions  Medication Sig Dispense Refill  . albuterol (PROVENTIL HFA;VENTOLIN HFA) 108 (90 BASE) MCG/ACT inhaler Inhale 2 puffs into the lungs every 6 (six) hours as needed for wheezing.  1 Inhaler  3  . amLODipine (NORVASC) 10 MG tablet Take 1 tablet (10 mg total) by mouth daily.  90 tablet  3  . aspirin 81 MG tablet Take 1 tablet (81 mg total) by mouth daily.  30 tablet  11  . feeding supplement (ENSURE COMPLETE) LIQD Take 237 mLs by mouth 2 (two) times daily between meals.  60 Bottle  0  . ferrous sulfate 325 (65 FE) MG tablet Take 1 tablet (325 mg total) by mouth 3 (three) times daily with meals.  90 tablet  3  . Multiple Vitamin (MULTIVITAMIN) tablet Take 1 tablet by mouth daily.      . prochlorperazine (COMPAZINE) 10 MG tablet Take 1 tablet (10 mg total) by mouth every 6 (six) hours as needed.  60 tablet  0   No current facility-administered medications for this visit.    SURGICAL HISTORY:  Past Surgical History  Procedure Laterality Date  . Video bronchoscopy Bilateral 04/10/2013    Procedure: VIDEO BRONCHOSCOPY WITH FLUORO;  Surgeon: Merwyn Katos, MD;  Location: [facility];  Service: Cardiopulmonary;  Laterality: Bilateral;  REVIEW OF SYSTEMS:  Constitutional: negative Eyes: negative Ears, nose, mouth, throat, and face: negative Respiratory: negative Cardiovascular: negative Gastrointestinal: negative Genitourinary:negative Integument/breast: negative Hematologic/lymphatic: negative Musculoskeletal:negative Neurological: negative Behavioral/Psych: negative Endocrine: negative Allergic/Immunologic: negative   PHYSICAL EXAMINATION: General appearance: alert, cooperative and no distress Head:  Normocephalic, without obvious abnormality, atraumatic Neck: no adenopathy, no JVD, supple, symmetrical, trachea midline and thyroid not enlarged, symmetric, no tenderness/mass/nodules Lymph nodes: Cervical, supraclavicular, and axillary nodes normal. Resp: clear to auscultation bilaterally Back: symmetric, no curvature. ROM normal. No CVA tenderness. Cardio: regular rate and rhythm, S1, S2 normal, no murmur, click, rub or gallop GI: soft, non-tender; bowel sounds normal; no masses,  no organomegaly Extremities: extremities normal, atraumatic, no cyanosis or edema Neurologic: Alert and oriented X 3, normal strength and tone. Normal symmetric reflexes. Normal coordination and gait  ECOG PERFORMANCE STATUS: 0 - Asymptomatic  Blood pressure 129/75, pulse 92, temperature 98.3 F (36.8 C), temperature source Oral, resp. rate 18, height 5\' 8"  (1.727 m), weight 138 lb 1.6 oz (62.642 kg), SpO2 97.00%.  LABORATORY DATA: Lab Results  Component Value Date   WBC 6.9 10/02/2013   HGB 9.8* 10/02/2013   HCT 32.4* 10/02/2013   MCV 74.3* 10/02/2013   PLT 207 10/02/2013      Chemistry      Component Value Date/Time   NA 139 10/02/2013 0848   NA 140 06/26/2013 0939   K 3.9 10/02/2013 0848   K 3.7 06/26/2013 0939   CL 104 06/26/2013 0939   CO2 22 10/02/2013 0848   CO2 28 06/26/2013 0939   BUN 11.1 10/02/2013 0848   BUN 10 06/26/2013 0939   CREATININE 0.8 10/02/2013 0848   CREATININE 0.78 06/26/2013 0939      Component Value Date/Time   CALCIUM 9.2 10/02/2013 0848   CALCIUM 9.0 06/26/2013 0939   ALKPHOS 86 10/02/2013 0848   ALKPHOS 153* 06/26/2013 0939   AST 23 10/02/2013 0848   AST 20 06/26/2013 0939   ALT 13 10/02/2013 0848   ALT 13 06/26/2013 0939   BILITOT 0.31 10/02/2013 0848   BILITOT 0.1* 06/26/2013 0939       RADIOGRAPHIC STUDIES: Ct Chest W Contrast  09/04/2013   CLINICAL DATA:  Squamous cell carcinoma lung.  EXAM: CT CHEST, ABDOMEN, AND PELVIS WITH CONTRAST  TECHNIQUE: Multidetector CT  imaging of the chest, abdomen and pelvis was performed following the standard protocol during bolus administration of intravenous contrast.  CONTRAST:  OMNIPAQUE IOHEXOL 300 MG/ML SOLN  COMPARISON:  CT ABD/PELVIS W CM dated 06/30/2013; CT ABD/PELVIS W CM dated 04/09/2013;   CT CHEST W/CM dated 04/04/2013  FINDINGS: CT CHEST FINDINGS  In the medial aspect of the left lower lobe, there is new cavitation at site of previous mass like pulmonary thickening measuring 15 x 24 mm (image 36) compared to 15 x 33 mm on prior. This is a site of previous consolidative mass on CT of 04/04/2013. Nodule inferior to this cavitation measures 10 mm (image 44) which was at site of increase consolidation on comparison exam which is lessened. There is bullous change the upper lobes. Stable small left apical nodule noted. Within the central right upper lobe 8 mm nodule (image 29) is decreased from 10 mm on prior.    CT ABDOMEN AND PELVIS FINDINGS  Within the posterior aspect of the right hepatic lobe there is a 2.6 x 3.8 cm subcapsular lesion which is increased from 2.2 x 3.2 cm on prior. Lesion within the medial left hepatic lobe  measuring 9 mm is not increased in size. Gallbladder and pancreas are normal. Low-attenuation lesion in the medial aspect of the spleen measures 28 mm in maximal dimension compared to 29 on prior. Mild thickening of the adrenal glands is similar prior. Kidneys are normal.  The stomach, small bowel, and cecum are normal. The colon and rectosigmoid colon are normal. There is a moderate volume stool throughout the colon.  Abdominal or is normal caliber. There are several prominent central mesenteric lymph nodes less than 1 cm short axis (image 86). These lymph nodes are similar to comparison exam.  There is no pelvic lymphadenopathy. Prostate gland is normal. A portion of the bladder extends into a right inguinal hernia. No aggressive osseous lesion.    IMPRESSION: 1. Enlargement of right hepatic lobe  subcapsular lesion is most consistent with progression of hepatic metastasis. 2. Interval decrease in volume of medial left lower lobe consolidation. There is new cavitation within this consolidative process which warrants close attention on follow-up. 3. Additional pulmonary nodules are stable or decreased. 4. Medial splenic lesion is stable and indeterminate.   Electronically Signed   By: Genevive Bi M.D.   On: 09/04/2013 08:19    ASSESSMENT AND PLAN: This is a very pleasant 79 years old [place] American male with metastatic non-small cell lung cancer, squamous cell carcinoma currently undergoing systemic chemotherapy with carboplatin and paclitaxel status post 6 cycles.  Unfortunately the recent CT scan of the chest, abdomen and pelvis showed evidence for disease progression especially in the liver. Patient was discussed with  Dr. Arbutus Ped. He will proceed with day 1 of cycle 1 of his systemic chemotherapy with gemcitabine 1000 mg per meter square given on days 1 and 8 every 3 weeks. He'll followup in 3 weeks prior to the start of cycle #2.  He was advised to call immediately if he has any concerning symptoms in the interval. The patient voices understanding of current disease status and treatment options and is in agreement with the current care plan.  All questions were answered. The patient knows to call the clinic with any problems, questions or concerns. We can certainly see the patient much sooner if necessary.  Laural Benes, Danely Bayliss E, PA-C

## 2013-10-03 NOTE — Telephone Encounter (Signed)
Compazine tablet took care of his mild nausea. Able to eat and drink wnl. No rash or fever or adverse effect. Understands to call with problems or questions.

## 2013-10-05 NOTE — Patient Instructions (Signed)
Continue with lab and chemotherapy as scheduled Followup in 3 weeks prior to the start of the next scheduled cycle of chemotherapy

## 2013-10-06 ENCOUNTER — Other Ambulatory Visit: Payer: Medicare Other | Admitting: Lab

## 2013-10-06 ENCOUNTER — Ambulatory Visit: Payer: Medicare Other

## 2013-10-07 ENCOUNTER — Telehealth: Payer: Self-pay | Admitting: Internal Medicine

## 2013-10-07 NOTE — Telephone Encounter (Signed)
sw. pt and advised on 12.18, 12.31 and 1.2.14 appt...pt ok and aware

## 2013-10-09 ENCOUNTER — Ambulatory Visit (HOSPITAL_BASED_OUTPATIENT_CLINIC_OR_DEPARTMENT_OTHER): Payer: Medicare Other

## 2013-10-09 ENCOUNTER — Other Ambulatory Visit (HOSPITAL_BASED_OUTPATIENT_CLINIC_OR_DEPARTMENT_OTHER): Payer: Medicare Other

## 2013-10-09 ENCOUNTER — Other Ambulatory Visit: Payer: Self-pay | Admitting: Physician Assistant

## 2013-10-09 VITALS — BP 148/87 | HR 93 | Temp 97.7°F

## 2013-10-09 DIAGNOSIS — C3492 Malignant neoplasm of unspecified part of left bronchus or lung: Secondary | ICD-10-CM

## 2013-10-09 DIAGNOSIS — C3490 Malignant neoplasm of unspecified part of unspecified bronchus or lung: Secondary | ICD-10-CM

## 2013-10-09 DIAGNOSIS — C3430 Malignant neoplasm of lower lobe, unspecified bronchus or lung: Secondary | ICD-10-CM

## 2013-10-09 DIAGNOSIS — Z5111 Encounter for antineoplastic chemotherapy: Secondary | ICD-10-CM

## 2013-10-09 LAB — COMPREHENSIVE METABOLIC PANEL (CC13)
AST: 16 U/L (ref 5–34)
Albumin: 3.6 g/dL (ref 3.5–5.0)
Anion Gap: 10 mEq/L (ref 3–11)
BUN: 9.6 mg/dL (ref 7.0–26.0)
CO2: 23 mEq/L (ref 22–29)
Calcium: 9.3 mg/dL (ref 8.4–10.4)
Chloride: 104 mEq/L (ref 98–109)
Creatinine: 0.7 mg/dL (ref 0.7–1.3)
Potassium: 4 mEq/L (ref 3.5–5.1)
Sodium: 137 mEq/L (ref 136–145)
Total Bilirubin: 0.28 mg/dL (ref 0.20–1.20)

## 2013-10-09 LAB — CBC WITH DIFFERENTIAL/PLATELET
Basophils Absolute: 0.1 10*3/uL (ref 0.0–0.1)
EOS%: 0.8 % (ref 0.0–7.0)
Eosinophils Absolute: 0.1 10*3/uL (ref 0.0–0.5)
HCT: 29.1 % — ABNORMAL LOW (ref 38.4–49.9)
HGB: 8.7 g/dL — ABNORMAL LOW (ref 13.0–17.1)
MCH: 22 pg — ABNORMAL LOW (ref 27.2–33.4)
MONO#: 0.3 10*3/uL (ref 0.1–0.9)
NEUT#: 1.2 10*3/uL — ABNORMAL LOW (ref 1.5–6.5)
NEUT%: 19.4 % — ABNORMAL LOW (ref 39.0–75.0)
RBC: 3.95 10*6/uL — ABNORMAL LOW (ref 4.20–5.82)
RDW: 17.4 % — ABNORMAL HIGH (ref 11.0–14.6)
WBC: 6.1 10*3/uL (ref 4.0–10.3)
lymph#: 4.6 10*3/uL — ABNORMAL HIGH (ref 0.9–3.3)

## 2013-10-09 MED ORDER — PROCHLORPERAZINE MALEATE 10 MG PO TABS
ORAL_TABLET | ORAL | Status: AC
  Filled 2013-10-09: qty 1

## 2013-10-09 MED ORDER — SODIUM CHLORIDE 0.9 % IV SOLN
1000.0000 mg/m2 | Freq: Once | INTRAVENOUS | Status: AC
  Administered 2013-10-09: 1710 mg via INTRAVENOUS
  Filled 2013-10-09: qty 44.97

## 2013-10-09 MED ORDER — SODIUM CHLORIDE 0.9 % IV SOLN
Freq: Once | INTRAVENOUS | Status: AC
  Administered 2013-10-09: 09:00:00 via INTRAVENOUS

## 2013-10-09 MED ORDER — PROCHLORPERAZINE MALEATE 10 MG PO TABS
10.0000 mg | ORAL_TABLET | Freq: Once | ORAL | Status: AC
  Administered 2013-10-09: 10 mg via ORAL

## 2013-10-09 NOTE — Progress Notes (Signed)
Ok to treat per Dr. Mohamed. 

## 2013-10-09 NOTE — Patient Instructions (Signed)
[facility] Discharge Instructions for Patients Receiving Chemotherapy  Today you received the following chemotherapy agents Gemzar.  To help prevent nausea and vomiting after your treatment, we encourage you to take your nausea medication as prescribed.   If you develop nausea and vomiting that is not controlled by your nausea medication, call the clinic.   BELOW ARE SYMPTOMS THAT SHOULD BE REPORTED IMMEDIATELY:  *FEVER GREATER THAN 100.5 F  *CHILLS WITH OR WITHOUT FEVER  NAUSEA AND VOMITING THAT IS NOT CONTROLLED WITH YOUR NAUSEA MEDICATION  *UNUSUAL SHORTNESS OF BREATH  *UNUSUAL BRUISING OR BLEEDING  TENDERNESS IN MOUTH AND THROAT WITH OR WITHOUT PRESENCE OF ULCERS  *URINARY PROBLEMS  *BOWEL PROBLEMS  UNUSUAL RASH Items with * indicate a potential emergency and should be followed up as soon as possible.  Feel free to call the clinic you have any questions or concerns. The clinic phone number is [phone].    

## 2013-10-11 ENCOUNTER — Other Ambulatory Visit: Payer: Self-pay | Admitting: Hematology & Oncology

## 2013-10-11 MED ORDER — PROCHLORPERAZINE 25 MG RE SUPP: 25.0000 mg | Freq: Three times a day (TID) | RECTAL | Status: DC | PRN

## 2013-10-13 ENCOUNTER — Other Ambulatory Visit: Payer: Medicare Other

## 2013-10-14 ENCOUNTER — Telehealth: Payer: Self-pay | Admitting: Medical Oncology

## 2013-10-14 NOTE — Telephone Encounter (Signed)
Gay reports pt has mild swelling in right foot . Denies pain or redness. I instructed gay to have pt elevate feet.

## 2013-10-17 ENCOUNTER — Other Ambulatory Visit: Payer: Medicare Other | Admitting: Lab

## 2013-10-17 ENCOUNTER — Ambulatory Visit: Payer: Medicare Other

## 2013-10-20 ENCOUNTER — Telehealth: Payer: Self-pay | Admitting: *Deleted

## 2013-10-20 ENCOUNTER — Ambulatory Visit: Payer: Medicare Other

## 2013-10-20 ENCOUNTER — Other Ambulatory Visit: Payer: Medicare Other

## 2013-10-20 DIAGNOSIS — C3490 Malignant neoplasm of unspecified part of unspecified bronchus or lung: Secondary | ICD-10-CM

## 2013-10-20 NOTE — Telephone Encounter (Signed)
Pt's daughter Galen Daft called stating that pt has been on and off dizzy x 2 weeks.  Pt is eating and drinking.  Per Dr Donnald Garre, will evaluate patient at f/u on 12/31. Will add type and hold to be drawn with lab work on 12/31.   SLJ

## 2013-10-22 ENCOUNTER — Other Ambulatory Visit (HOSPITAL_BASED_OUTPATIENT_CLINIC_OR_DEPARTMENT_OTHER): Payer: Medicare Other

## 2013-10-22 ENCOUNTER — Telehealth: Payer: Self-pay | Admitting: Internal Medicine

## 2013-10-22 ENCOUNTER — Ambulatory Visit (HOSPITAL_BASED_OUTPATIENT_CLINIC_OR_DEPARTMENT_OTHER): Payer: Medicare Other | Admitting: Internal Medicine

## 2013-10-22 VITALS — BP 170/106 | HR 94 | Temp 97.3°F | Resp 17 | Ht 68.0 in

## 2013-10-22 DIAGNOSIS — C3430 Malignant neoplasm of lower lobe, unspecified bronchus or lung: Secondary | ICD-10-CM

## 2013-10-22 DIAGNOSIS — C787 Secondary malignant neoplasm of liver and intrahepatic bile duct: Secondary | ICD-10-CM

## 2013-10-22 DIAGNOSIS — D6481 Anemia due to antineoplastic chemotherapy: Secondary | ICD-10-CM

## 2013-10-22 DIAGNOSIS — C3492 Malignant neoplasm of unspecified part of left bronchus or lung: Secondary | ICD-10-CM

## 2013-10-22 DIAGNOSIS — C3490 Malignant neoplasm of unspecified part of unspecified bronchus or lung: Secondary | ICD-10-CM

## 2013-10-22 LAB — CBC WITH DIFFERENTIAL/PLATELET
Basophils Absolute: 0.2 10*3/uL — ABNORMAL HIGH (ref 0.0–0.1)
EOS%: 0.6 % (ref 0.0–7.0)
HGB: 9 g/dL — ABNORMAL LOW (ref 13.0–17.1)
LYMPH%: 70.8 % — ABNORMAL HIGH (ref 14.0–49.0)
MCH: 22.5 pg — ABNORMAL LOW (ref 27.2–33.4)
MCHC: 30.6 g/dL — ABNORMAL LOW (ref 32.0–36.0)
MCV: 73.6 fL — ABNORMAL LOW (ref 79.3–98.0)
MONO%: 6.9 % (ref 0.0–14.0)
Platelets: 632 10*3/uL — ABNORMAL HIGH (ref 140–400)
RBC: 4 10*6/uL — ABNORMAL LOW (ref 4.20–5.82)
RDW: 20.4 % — ABNORMAL HIGH (ref 11.0–14.6)
lymph#: 10.1 10*3/uL — ABNORMAL HIGH (ref 0.9–3.3)

## 2013-10-22 LAB — COMPREHENSIVE METABOLIC PANEL (CC13)
ALT: 7 U/L (ref 0–55)
AST: 17 U/L (ref 5–34)
Albumin: 3.6 g/dL (ref 3.5–5.0)
Alkaline Phosphatase: 81 U/L (ref 40–150)
BUN: 10.8 mg/dL (ref 7.0–26.0)
Chloride: 104 mEq/L (ref 98–109)
Potassium: 3.8 mEq/L (ref 3.5–5.1)
Sodium: 138 mEq/L (ref 136–145)
Total Bilirubin: 0.29 mg/dL (ref 0.20–1.20)

## 2013-10-22 LAB — TECHNOLOGIST REVIEW

## 2013-10-22 LAB — HOLD TUBE, BLOOD BANK

## 2013-10-22 NOTE — Telephone Encounter (Signed)
gv and printed appt sched and avs for pt for Jan 2015  sed added tx. °

## 2013-10-22 NOTE — Progress Notes (Signed)
[facility] Telephone:([phone]) [phone]   Fax:([phone]) [phone]  OFFICE PROGRESS NOTE  No PCP Per Patient [address] [place] [address]  DIAGNOSIS: Metastatic non-small cell lung cancer, squamous cell carcinoma with large left lower lobe lung mass as well as liver metastasis diagnosed in June of 2014   PRIOR THERAPY:  1) Palliative radiotherapy to the left lower lobe obstructing mass under the care of Dr. Roselind Messier. 2) Systemic chemotherapy with carboplatin for AUC of 5 and paclitaxel 175 mg/M2 every 3 weeks with Neulasta support. Status post 6 cycles, as was given 08/18/2013 discontinued secondary to disease progression. Starting from cycle #4, his carboplatin would be for AUC of 6 and paclitaxel 200 mg/M2 every 3 weeks    CURRENT THERAPY: gemcitabine 1000 mg/M2 on days 1 and 8 every 3 weeks.first cycle on 09/22/2013.  CHEMOTHERAPY INTENT: Palliative  CURRENT # OF CHEMOTHERAPY CYCLES: 1 CURRENT ANTIEMETICS: Zofran, dexamethasone, Compazine  CURRENT SMOKING STATUS: Former smoker, quit 05/14/2013  ORAL CHEMOTHERAPY AND CONSENT: n/a  CURRENT BISPHOSPHONATES USE: none  PAIN MANAGEMENT: none  NARCOTICS INDUCED CONSTIPATION: none  LIVING WILL AND CODE STATUS: no CODE BLUE   INTERVAL HISTORY: Cameron Lam. 79 y.o. male returns to the clinic today for followup visit accompanied by his daughter. The patient is feeling fine today except for increasing fatigue from the recent chemotherapy, partially secondary to chemotherapy-induced anemia. The patient denied having any significant nausea or vomiting, no fever or chills. He denied having any peripheral neuropathy. The patient denied having any significant weight loss or night sweats. Has no chest pain, shortness of breath, cough or hemoptysis. He would like to delay the start of cycle #2 by 1 week to gain more energy.  MEDICAL HISTORY: Past Medical History  Diagnosis Date  . Shingles outbreak 06/16/10    Lt anterior  and postior thigh/buttocks  . Hypertension   . Tobacco abuse   . Lung cancer   . Hx of radiation therapy 6/26 - 05/07/13    LLL lung 37.5 gray in 15 fractions    ALLERGIES:  has No Known Allergies.  MEDICATIONS:  Current Outpatient Prescriptions  Medication Sig Dispense Refill  . albuterol (PROVENTIL HFA;VENTOLIN HFA) 108 (90 BASE) MCG/ACT inhaler Inhale 2 puffs into the lungs every 6 (six) hours as needed for wheezing.  1 Inhaler  3  . amLODipine (NORVASC) 10 MG tablet Take 1 tablet (10 mg total) by mouth daily.  90 tablet  3  . aspirin 81 MG tablet Take 1 tablet (81 mg total) by mouth daily.  30 tablet  11  . feeding supplement (ENSURE COMPLETE) LIQD Take 237 mLs by mouth 2 (two) times daily between meals.  60 Bottle  0  . ferrous sulfate 325 (65 FE) MG tablet Take 1 tablet (325 mg total) by mouth 3 (three) times daily with meals.  90 tablet  3  . Multiple Vitamin (MULTIVITAMIN) tablet Take 1 tablet by mouth daily.      . prochlorperazine (COMPAZINE) 10 MG tablet Take 1 tablet (10 mg total) by mouth every 6 (six) hours as needed.  60 tablet  0  . prochlorperazine (COMPAZINE) 25 MG suppository Place 1 suppository (25 mg total) rectally every 8 (eight) hours as needed for nausea or vomiting.  20 suppository  2   No current facility-administered medications for this visit.    SURGICAL HISTORY:  Past Surgical History  Procedure Laterality Date  . Video bronchoscopy Bilateral 04/10/2013  Procedure: VIDEO BRONCHOSCOPY WITH FLUORO;  Surgeon: Merwyn Katos, MD;  Location: [facility];  Service: Cardiopulmonary;  Laterality: Bilateral;    REVIEW OF SYSTEMS:  Constitutional: positive for fatigue Eyes: negative Ears, nose, mouth, throat, and face: negative Respiratory: negative Cardiovascular: negative Gastrointestinal: negative Genitourinary:negative Integument/breast: negative Hematologic/lymphatic: negative Musculoskeletal:negative Neurological: negative Behavioral/Psych:  negative Endocrine: negative Allergic/Immunologic: negative   PHYSICAL EXAMINATION: General appearance: alert, cooperative and no distress Head: Normocephalic, without obvious abnormality, atraumatic Neck: no adenopathy, no JVD, supple, symmetrical, trachea midline and thyroid not enlarged, symmetric, no tenderness/mass/nodules Lymph nodes: Cervical, supraclavicular, and axillary nodes normal. Resp: clear to auscultation bilaterally Back: symmetric, no curvature. ROM normal. No CVA tenderness. Cardio: regular rate and rhythm, S1, S2 normal, no murmur, click, rub or gallop GI: soft, non-tender; bowel sounds normal; no masses,  no organomegaly Extremities: extremities normal, atraumatic, no cyanosis or edema Neurologic: Alert and oriented X 3, normal strength and tone. Normal symmetric reflexes. Normal coordination and gait  ECOG PERFORMANCE STATUS: 1 - Symptomatic but completely ambulatory  Blood pressure 170/106, pulse 94, temperature 97.3 F (36.3 C), temperature source Oral, resp. rate 17, height 5\' 8"  (1.727 m), weight 0 lb (0 kg), SpO2 98.00%.  LABORATORY DATA: Lab Results  Component Value Date   WBC 14.2* 10/22/2013   HGB 9.0* 10/22/2013   HCT 29.4* 10/22/2013   MCV 73.6* 10/22/2013   PLT 632* 10/22/2013      Chemistry      Component Value Date/Time   NA 137 10/09/2013 0821   NA 140 06/26/2013 0939   K 4.0 10/09/2013 0821   K 3.7 06/26/2013 0939   CL 104 06/26/2013 0939   CO2 23 10/09/2013 0821   CO2 28 06/26/2013 0939   BUN 9.6 10/09/2013 0821   BUN 10 06/26/2013 0939   CREATININE 0.7 10/09/2013 0821   CREATININE 0.78 06/26/2013 0939      Component Value Date/Time   CALCIUM 9.3 10/09/2013 0821   CALCIUM 9.0 06/26/2013 0939   ALKPHOS 89 10/09/2013 0821   ALKPHOS 153* 06/26/2013 0939   AST 16 10/09/2013 0821   AST 20 06/26/2013 0939   ALT 7 10/09/2013 0821   ALT 13 06/26/2013 0939   BILITOT 0.28 10/09/2013 0821   BILITOT 0.1* 06/26/2013 0939       RADIOGRAPHIC  STUDIES:   ASSESSMENT AND PLAN: This is a very pleasant 79 years old African American male with metastatic non-small cell lung cancer, squamous cell carcinoma currently undergoing systemic chemotherapy with carboplatin and paclitaxel status post 6 cycles. He has evidence for disease progression after cycle #6. The patient is currently on systemic chemotherapy with single agent gemcitabine is status post 1 cycle. He has increasing fatigue and weakness after the first cycle of his treatment most likely secondary to chemotherapy-induced anemia. I would delay the start of cycle #2 until next week based on the patient's wishes. I would reduce the dose of gemcitabine to 800 mg/M2 on days 1 and 8 every 3 weeks because of the significant weakness and fatigue. I will arrange for the patient to have repeat CT scan of the chest, abdomen and pelvis after cycle #2 for restaging of his disease. He would come back for follow up visit in 4 weeks. He was advised to call immediately if he has any concerning symptoms in the interval. The patient voices understanding of current disease status and treatment options and is in agreement with the current care plan.  All questions were answered. The patient knows to call  the clinic with any problems, questions or concerns. We can certainly see the patient much sooner if necessary.

## 2013-10-23 ENCOUNTER — Encounter: Payer: Self-pay | Admitting: Internal Medicine

## 2013-10-23 NOTE — Patient Instructions (Signed)
CURRENT THERAPY: gemcitabine 1000 mg/M2 on days 1 and 8 every 3 weeks.first cycle on 09/22/2013.  CHEMOTHERAPY INTENT: Palliative  CURRENT # OF CHEMOTHERAPY CYCLES: 1  CURRENT ANTIEMETICS: Zofran, dexamethasone, Compazine  CURRENT SMOKING STATUS: Former smoker, quit 05/14/2013  ORAL CHEMOTHERAPY AND CONSENT: n/a  CURRENT BISPHOSPHONATES USE: none  PAIN MANAGEMENT: none  NARCOTICS INDUCED CONSTIPATION: none  LIVING WILL AND CODE STATUS: no CODE BLUE

## 2013-10-24 ENCOUNTER — Ambulatory Visit: Payer: Medicare Other

## 2013-10-24 ENCOUNTER — Other Ambulatory Visit: Payer: Medicare Other

## 2013-10-27 ENCOUNTER — Encounter: Payer: Self-pay | Admitting: Internal Medicine

## 2013-10-27 ENCOUNTER — Other Ambulatory Visit: Payer: Medicare Other

## 2013-10-27 ENCOUNTER — Ambulatory Visit: Payer: Medicare Other | Admitting: Internal Medicine

## 2013-10-27 ENCOUNTER — Ambulatory Visit: Payer: Medicare Other

## 2013-10-27 NOTE — Progress Notes (Signed)
Put daughter's fmla form on nurse's desk.

## 2013-10-29 ENCOUNTER — Encounter: Payer: Self-pay | Admitting: Internal Medicine

## 2013-10-29 ENCOUNTER — Telehealth: Payer: Self-pay | Admitting: *Deleted

## 2013-10-29 NOTE — Telephone Encounter (Signed)
Pt's daughter Dannielle Karvonen called stating that pt is interesting in taking a break for the month of January.  He wants to pick back up on February with chemo, then get his CT scan and f/u at the end of February.  Onc tx schedule filled out.  SLJ

## 2013-10-29 NOTE — Progress Notes (Signed)
Faxed daughter's fmla form to [facility] @ [phone].

## 2013-10-30 ENCOUNTER — Telehealth: Payer: Self-pay | Admitting: Internal Medicine

## 2013-10-30 ENCOUNTER — Other Ambulatory Visit: Payer: Self-pay | Admitting: *Deleted

## 2013-10-30 NOTE — Telephone Encounter (Signed)
s.w. pt daughter and advised on all adjust appts...Marland Kitchenok and aware

## 2013-10-31 ENCOUNTER — Other Ambulatory Visit: Payer: Medicare Other

## 2013-10-31 ENCOUNTER — Ambulatory Visit: Payer: Medicare Other

## 2013-10-31 ENCOUNTER — Telehealth: Payer: Self-pay | Admitting: *Deleted

## 2013-10-31 NOTE — Telephone Encounter (Signed)
Gay called stating that pt is still having trouble balancing.  Per Dr Vista Mink, okay to move MRI of the brain to earlier.  Called and left msg on Gay's cell phone that schedulers will be in touch.  SLJ

## 2013-11-03 ENCOUNTER — Telehealth: Payer: Self-pay | Admitting: Internal Medicine

## 2013-11-03 ENCOUNTER — Telehealth: Payer: Self-pay | Admitting: *Deleted

## 2013-11-03 DIAGNOSIS — C3490 Malignant neoplasm of unspecified part of unspecified bronchus or lung: Secondary | ICD-10-CM

## 2013-11-03 MED ORDER — LORAZEPAM 1 MG PO TABS: 1.0000 mg | ORAL_TABLET | Freq: Once | ORAL | Status: DC

## 2013-11-03 NOTE — Telephone Encounter (Signed)
Per 1/9 pof move mri to within 1wk. Not other orders. S/w dtr Abner Greenspan re new mri appt d/t for 1/16 @ 8pm @ [facility]. Pt to arrive 7:30pm (evening).

## 2013-11-03 NOTE — Telephone Encounter (Signed)
Gay called asking for something for claustrophobia for pt p/t MRI on Friday 11/07/13.  Per Dr Vista Mink, okay to give lorazepam 1mg  30 minutes prior to MRI.  Gay verbalized understanding. SLJ

## 2013-11-07 ENCOUNTER — Ambulatory Visit (HOSPITAL_COMMUNITY)
Admission: RE | Admit: 2013-11-07 | Discharge: 2013-11-07 | Disposition: A | Discharge status: Home / Self Care | Payer: Medicare Other | Source: Ambulatory Visit | Attending: Internal Medicine | Admitting: Internal Medicine

## 2013-11-07 ENCOUNTER — Other Ambulatory Visit: Payer: Medicare Other

## 2013-11-07 ENCOUNTER — Ambulatory Visit: Payer: Medicare Other

## 2013-11-07 DIAGNOSIS — G936 Cerebral edema: Secondary | ICD-10-CM | POA: Insufficient documentation

## 2013-11-07 DIAGNOSIS — G939 Disorder of brain, unspecified: Secondary | ICD-10-CM | POA: Insufficient documentation

## 2013-11-07 DIAGNOSIS — J3489 Other specified disorders of nose and nasal sinuses: Secondary | ICD-10-CM | POA: Insufficient documentation

## 2013-11-07 DIAGNOSIS — Z8673 Personal history of transient ischemic attack (TIA), and cerebral infarction without residual deficits: Secondary | ICD-10-CM | POA: Insufficient documentation

## 2013-11-07 DIAGNOSIS — G319 Degenerative disease of nervous system, unspecified: Secondary | ICD-10-CM | POA: Insufficient documentation

## 2013-11-07 DIAGNOSIS — C3492 Malignant neoplasm of unspecified part of left bronchus or lung: Secondary | ICD-10-CM

## 2013-11-07 DIAGNOSIS — C3490 Malignant neoplasm of unspecified part of unspecified bronchus or lung: Secondary | ICD-10-CM | POA: Insufficient documentation

## 2013-11-07 DIAGNOSIS — R42 Dizziness and giddiness: Secondary | ICD-10-CM | POA: Insufficient documentation

## 2013-11-07 MED ORDER — GADOBENATE DIMEGLUMINE 529 MG/ML IV SOLN
15.0000 mL | Freq: Once | INTRAVENOUS | Status: AC | PRN
  Administered 2013-11-07: 15 mL via INTRAVENOUS

## 2013-11-08 ENCOUNTER — Telehealth: Payer: Self-pay | Admitting: Internal Medicine

## 2013-11-08 ENCOUNTER — Other Ambulatory Visit: Payer: Self-pay | Admitting: Internal Medicine

## 2013-11-08 DIAGNOSIS — C3490 Malignant neoplasm of unspecified part of unspecified bronchus or lung: Secondary | ICD-10-CM

## 2013-11-08 MED ORDER — DEXAMETHASONE 4 MG PO TABS: 4.0000 mg | ORAL_TABLET | Freq: Four times a day (QID) | ORAL | Status: AC

## 2013-11-08 NOTE — Telephone Encounter (Signed)
I called the patient and his daughter to inform them about the results of the MRI of the brain. There was no answer. I left a message for his daughter to call the [facility] so I can inform her or her father about the results. I made stat referral to Dr. Sondra Come for consideration of palliative radiotherapy to his brain lesion. I will also E-scribe Decadron 4 mg by mouth 4 times a day and his dose will be tapered by Dr. Sondra Come as he received radiation therapy.

## 2013-11-10 ENCOUNTER — Telehealth: Payer: Self-pay | Admitting: *Deleted

## 2013-11-10 ENCOUNTER — Other Ambulatory Visit: Payer: Self-pay | Admitting: Radiation Therapy

## 2013-11-10 DIAGNOSIS — C7931 Secondary malignant neoplasm of brain: Secondary | ICD-10-CM

## 2013-11-10 DIAGNOSIS — C7949 Secondary malignant neoplasm of other parts of nervous system: Principal | ICD-10-CM

## 2013-11-10 NOTE — Telephone Encounter (Signed)
THE RESULTS WERE CALLED AND FAXED. THIS REPORT WAS GIVEN TO DR.MOHAMED.

## 2013-11-11 ENCOUNTER — Other Ambulatory Visit: Payer: Self-pay | Admitting: Neurosurgery

## 2013-11-12 ENCOUNTER — Encounter (HOSPITAL_COMMUNITY): Payer: Self-pay | Admitting: Pharmacy Technician

## 2013-11-12 ENCOUNTER — Ambulatory Visit
Admission: RE | Admit: 2013-11-12 | Discharge: 2013-11-12 | Disposition: A | Discharge status: Home / Self Care | Payer: Medicare Other | Source: Ambulatory Visit | Attending: Radiation Oncology | Admitting: Radiation Oncology

## 2013-11-12 DIAGNOSIS — C7931 Secondary malignant neoplasm of brain: Secondary | ICD-10-CM

## 2013-11-12 DIAGNOSIS — C7949 Secondary malignant neoplasm of other parts of nervous system: Principal | ICD-10-CM

## 2013-11-12 MED ORDER — GADOBENATE DIMEGLUMINE 529 MG/ML IV SOLN
12.0000 mL | Freq: Once | INTRAVENOUS | Status: AC | PRN
  Administered 2013-11-12: 12 mL via INTRAVENOUS

## 2013-11-12 NOTE — Progress Notes (Signed)
Location/Histology of Brain Tumor:  Left cerebellar mass ,( Reconsult primary LLL lung Cancer , as well as liver mets dx 03/2013 )  Patient presented with symptoms of:  Loss balance and dizziness   Past or anticipated interventions, if any, per neurosurgery:   Past or anticipated interventions, if any, per medical oncology: 1) Palliative radiotherapy to the left lower lobe obstructing mass under the care of Dr. Sondra Come.  2) Systemic chemotherapy with carboplatin for AUC of 5 and paclitaxel 175 mg/M2 every 3 weeks with Neulasta support. Status post 6 cycles, as was given 08/18/2013 discontinued secondary to disease progression. Starting from cycle #4, his carboplatin would be for AUC of 6 and paclitaxel 200 mg/M2 every 3 weeks  CURRENT THERAPY: gemcitabine 1000 mg/M2 on days 1 and 8 every 3 weeks.first cycle on 09/22/2013.  CHEMOTHERAPY INTENT: Palliative     Dose of Decadron, if applicable: 8/52/77:  I will also E-scribe Decadron 4 mg by mouth 4 times a day and his dose will be tapered by Dr. Sondra Come ( Dr.moody)as he received radiation therapy. Dr.Mohamed    Recent neurologic symptoms, if any:   Seizures: no  Headaches: no  Nausea: no  Dizziness/ataxia: yes  Difficulty with hand coordination: no  Focal numbness/weakness: no   Visual deficits/changes: no  Confusion/Memory deficits: no  Painful bone metastases at present, if any: no  SAFETY ISSUES: Yes,   Prior radiation? Yes,Left lower lung  04/18/15-05/07/13 37.5Gy/43fx  Pacemaker/ICD?   Is the patient on methotrexate?   Additional Complaints / other details:  Married, 25 children    Smoker ,cigarettes  3-4 cigarettes daily still ,claustophobia

## 2013-11-13 ENCOUNTER — Encounter: Payer: Self-pay | Admitting: Internal Medicine

## 2013-11-13 ENCOUNTER — Ambulatory Visit
Admission: RE | Admit: 2013-11-13 | Discharge: 2013-11-13 | Disposition: A | Discharge status: Home / Self Care | Payer: Medicare Other | Source: Ambulatory Visit | Attending: Radiation Oncology | Admitting: Radiation Oncology

## 2013-11-13 ENCOUNTER — Encounter: Payer: Self-pay | Admitting: Radiation Oncology

## 2013-11-13 ENCOUNTER — Ambulatory Visit: Payer: Medicare Other

## 2013-11-13 ENCOUNTER — Other Ambulatory Visit: Payer: Medicare Other

## 2013-11-13 VITALS — BP 157/91 | HR 85 | Temp 97.8°F | Resp 20 | Ht 68.0 in | Wt 135.4 lb

## 2013-11-13 DIAGNOSIS — C7949 Secondary malignant neoplasm of other parts of nervous system: Principal | ICD-10-CM

## 2013-11-13 DIAGNOSIS — Z923 Personal history of irradiation: Secondary | ICD-10-CM | POA: Insufficient documentation

## 2013-11-13 DIAGNOSIS — Z510 Encounter for antineoplastic radiation therapy: Secondary | ICD-10-CM | POA: Insufficient documentation

## 2013-11-13 DIAGNOSIS — C7931 Secondary malignant neoplasm of brain: Secondary | ICD-10-CM

## 2013-11-13 DIAGNOSIS — Z8673 Personal history of transient ischemic attack (TIA), and cerebral infarction without residual deficits: Secondary | ICD-10-CM | POA: Insufficient documentation

## 2013-11-13 DIAGNOSIS — Z87891 Personal history of nicotine dependence: Secondary | ICD-10-CM | POA: Insufficient documentation

## 2013-11-13 DIAGNOSIS — C787 Secondary malignant neoplasm of liver and intrahepatic bile duct: Secondary | ICD-10-CM | POA: Insufficient documentation

## 2013-11-13 DIAGNOSIS — G319 Degenerative disease of nervous system, unspecified: Secondary | ICD-10-CM | POA: Insufficient documentation

## 2013-11-13 DIAGNOSIS — C3490 Malignant neoplasm of unspecified part of unspecified bronchus or lung: Secondary | ICD-10-CM

## 2013-11-13 DIAGNOSIS — Z79899 Other long term (current) drug therapy: Secondary | ICD-10-CM | POA: Insufficient documentation

## 2013-11-13 DIAGNOSIS — I10 Essential (primary) hypertension: Secondary | ICD-10-CM | POA: Insufficient documentation

## 2013-11-13 DIAGNOSIS — J3489 Other specified disorders of nose and nasal sinuses: Secondary | ICD-10-CM | POA: Insufficient documentation

## 2013-11-13 DIAGNOSIS — G936 Cerebral edema: Secondary | ICD-10-CM | POA: Insufficient documentation

## 2013-11-13 MED ORDER — SODIUM CHLORIDE 0.9 % IJ SOLN: 10.0000 mL | Freq: Once | INTRAMUSCULAR | Status: DC

## 2013-11-13 NOTE — Progress Notes (Signed)
patient had given name and dob as identification, not allergic to IV dye, not a diabetic , attempted 2 different sites rfa and lac both unsuccessful, Shawnie Dapper,  Attempted also 2 sites unsuccessful, AMY RN Camera operator med/onc  Placed #22g 1in cIV catheter x 1 attempt right hand, excellent blood return, ithanked Amy, called CT sim, patient is ready for CT simulation, Jehnna,RT escorted patient to CT sim 10:13 AM

## 2013-11-13 NOTE — Progress Notes (Signed)
Put daughter's fmla form on nurse's desk.

## 2013-11-14 DIAGNOSIS — C7949 Secondary malignant neoplasm of other parts of nervous system: Principal | ICD-10-CM

## 2013-11-14 DIAGNOSIS — C7931 Secondary malignant neoplasm of brain: Secondary | ICD-10-CM | POA: Insufficient documentation

## 2013-11-14 NOTE — Progress Notes (Signed)
Radiation Oncology         [phone]) [phone] ________________________________  Name: Cameron Lam. MRN: [id number]  Date: 11/13/2013  DOB: 05-30-1934  CC:No PCP Per Patient  Curt Bears, MD   Gery Pray, MD  REFERRING PHYSICIAN: Curt Bears, MD   DIAGNOSIS: The primary encounter diagnosis was Brain metastases. A diagnosis of Secondary malignant neoplasm of brain and spinal cord was also pertinent to this visit.   HISTORY OF PRESENT ILLNESS::Cameron Lam. is a 80 y.o. male who is seen for an initial consultation visit. The patient is seen for reevaluation today. He has a diagnosis of metastatic non-small cell lung cancer, squamous cell carcinoma. The patient presented with a large left lower lobe lung mass as well as liver metastases diagnosed in June of 2014. The patient received palliative radiation treatment to the left lung through Dr. Duke Salvia and subsequently has proceeded with chemotherapy. The patient recently complained of loss of balance and dizziness. He proceeded to undergo an MRI scan on 11/07/2013. This showed a new 4.7 cm left cerebellar mass which was with metastasis. Moderate surrounding vasogenic edema was present. The patient has begun Decadron. The patient saw Dr. Vertell Limber yesterday to discuss resection.   PREVIOUS RADIATION THERAPY: Yes: Noted as above to the left lung through Dr. Sondra Come   PAST MEDICAL HISTORY:  has a past medical history of Shingles outbreak (06/16/10); Hypertension; Tobacco abuse; Lung cancer; and radiation therapy (6/26 - 05/07/13).     PAST SURGICAL HISTORY: Past Surgical History  Procedure Laterality Date  . Video bronchoscopy Bilateral 04/10/2013    Procedure: VIDEO BRONCHOSCOPY WITH FLUORO;  Surgeon: Wilhelmina Mcardle, MD;  Location: [facility];  Service: Cardiopulmonary;  Laterality: Bilateral;     FAMILY HISTORY: family history is not on file.   SOCIAL HISTORY:  reports that he quit smoking about 7 months ago. His smoking use  included Cigarettes. He has a 90 pack-year smoking history. He does not have any smokeless tobacco history on file. He reports that he does not drink alcohol or use illicit drugs.   ALLERGIES: Review of patient's allergies indicates no known allergies.   MEDICATIONS:  Current Outpatient Prescriptions  Medication Sig Dispense Refill  . albuterol (PROVENTIL HFA;VENTOLIN HFA) 108 (90 BASE) MCG/ACT inhaler Inhale 2 puffs into the lungs every 6 (six) hours as needed for wheezing.  1 Inhaler  3  . amLODipine (NORVASC) 10 MG tablet Take 1 tablet (10 mg total) by mouth daily.  90 tablet  3  . aspirin 81 MG tablet Take 1 tablet (81 mg total) by mouth daily.  30 tablet  11  . dexamethasone (DECADRON) 4 MG tablet Take 1 tablet (4 mg total) by mouth 4 (four) times daily.  40 tablet  1  . feeding supplement (ENSURE COMPLETE) LIQD Take 237 mLs by mouth 2 (two) times daily between meals.  60 Bottle  0  . ferrous sulfate 325 (65 FE) MG tablet Take 325 mg by mouth 2 (two) times daily with a meal.      . Multiple Vitamin (MULTIVITAMIN) tablet Take 1 tablet by mouth daily.      . prochlorperazine (COMPAZINE) 10 MG tablet Take 10 mg by mouth every 6 (six) hours as needed for nausea or vomiting.       No current facility-administered medications for this encounter.     REVIEW OF SYSTEMS:  A 15 point review of systems is documented in the electronic medical record. This was obtained by the nursing staff.  However, I reviewed this with the patient to discuss relevant findings and make appropriate changes.  Pertinent items are noted in HPI.    PHYSICAL EXAM:  height is 5\' 8"  (1.727 m) and weight is 135 lb 6.4 oz (61.417 kg). His oral temperature is 97.8 F (36.6 C). His blood pressure is 157/91 and his pulse is 85. His respiration is 20 and oxygen saturation is 97%.   ECOG = 1  0 - Asymptomatic (Fully active, able to carry on all predisease activities without restriction)  1 - Symptomatic but completely  ambulatory (Restricted in physically strenuous activity but ambulatory and able to carry out work of a light or sedentary nature. For example, light housework, office work)  2 - Symptomatic, <50% in bed during the day (Ambulatory and capable of all self care but unable to carry out any work activities. Up and about more than 50% of waking hours)  3 - Symptomatic, >50% in bed, but not bedbound (Capable of only limited self-care, confined to bed or chair 50% or more of waking hours)  4 - Bedbound (Completely disabled. Cannot carry on any self-care. Totally confined to bed or chair)  5 - Death   Eustace Pen MM, Creech RH, Tormey DC, et al. [phone]). "Toxicity and response criteria of the [facility]". [place] Oncol. 5 (6): 649-55  General: Well-developed, in no acute distress HEENT: Normocephalic, atraumatic Cardiovascular: Regular rate and rhythm Respiratory: Clear to auscultation bilaterally GI: Soft, nontender, normal bowel sounds Extremities: No edema present Neuro: 5 out of 5 strength bilaterally in the upper and lower extremities   LABORATORY DATA:  Lab Results  Component Value Date   WBC 14.2* 10/22/2013   HGB 9.0* 10/22/2013   HCT 29.4* 10/22/2013   MCV 73.6* 10/22/2013   PLT 632* 10/22/2013   Lab Results  Component Value Date   NA 138 10/22/2013   K 3.8 10/22/2013   CL 104 06/26/2013   CO2 22 10/22/2013   Lab Results  Component Value Date   ALT 7 10/22/2013   AST 17 10/22/2013   ALKPHOS 81 10/22/2013   BILITOT 0.29 10/22/2013      RADIOGRAPHY: Mr Jeri Cos Wo Contrast  11/13/2013   CLINICAL DATA:  Stealth protocol.  Brain tumor.  EXAM: MRI HEAD WITHOUT AND WITH CONTRAST  TECHNIQUE: Multiplanar, multiecho pulse sequences of the brain and surrounding structures were obtained without and with intravenous contrast.  CONTRAST:  62mL MULTIHANCE GADOBENATE DIMEGLUMINE 529 MG/ML IV SOLN  COMPARISON:  Most recent 11/07/2013.  FINDINGS: Again noted is a 4.4 x  4.5 x 3.0 cm left cerebellar hemispheric mass involving inferior vermis and fourth ventricle slight midline shift. Impending obstructive hydrocephalus. Peripheral enhancement with central necrosis. Mild surrounding vasogenic edema. No appreciable worsening from priors.  Re-demonstrated is a similar gyriform enhancement right parietal lobe not typical for metastasis, likely representing subacute ischemia.  No other interval changes from 11/07/2013.  No new lesions are seen.  IMPRESSION: Solitary left cerebellar metastasis. Significant involvement of the vermis with tumor extending into the fourth ventricle. The patient is at risk for acute obstructive hydrocephalus with only slight increased in size of the mass.  Suspect subacute right parietal MCA territory gyriform infarct.   Electronically Signed   By: Rolla Flatten M.D.   On: 11/13/2013 07:48   Mr Jeri Cos LP Contrast  11/08/2013   CLINICAL DATA:  Lung cancer.  Loss of balance and dizziness.  EXAM: MRI HEAD WITHOUT AND WITH CONTRAST  TECHNIQUE: Multiplanar, multiecho pulse sequences of the brain and surrounding structures were obtained without and with intravenous contrast.  CONTRAST:  48mL MULTIHANCE GADOBENATE DIMEGLUMINE 529 MG/ML IV SOLN  COMPARISON:  Head CT with and without contrast 04/14/2013  FINDINGS: There is no evidence of acute infarct. There is no intracranial hemorrhage. There is a new heterogeneously enhancing mass in the left cerebellum which measures 4.7 x 4.1 x 3.1 cm (transverse by AP by craniocaudal. T2 hyperintense, nonenhancing portions of the mass may represent necrosis. There is partial effacement of the fourth ventricle and mild mass effect on the left posterior lateral aspect of the pons and medulla. There is moderate surrounding vasogenic edema throughout the left cerebellar hemisphere extending into the vermis, medial aspect of the right cerebellum, and pons. There is no evidence of hydrocephalus.  There is a small focus of gyriform  enhancement in the right parietal lobe (series 11, image 39 and series 12, image 7) with associated cortical and subcortical T2 hyperintensity. No other areas of abnormal enhancement are identified elsewhere. There is moderate cerebral atrophy. Numerous foci of T2 hyperintensity within the subcortical and deep cerebral white matter are compatible with advanced chronic small vessel ischemic disease. Old right cerebellar infarct is noted. There is no evidence of intracranial hemorrhage, midline shift, or extra-axial fluid collection. Supra clinoid internal carotid artery flow voids appear mildly ectatic. Orbits are unremarkable. Moderate right maxillary sinus mucosal thickening is noted. There is also mild right sphenoid sinus mucosal thickening.  IMPRESSION: 1. New 4.7 cm left cerebellar mass with areas of internal cystic change/necrosis, concerning for metastasis. There is moderate surrounding vasogenic edema with mass effect on the brainstem and partial effacement of the fourth ventricle. No hydrocephalus. 2. Small focus of gyriform enhancement in the right parietal lobe. This may to represent an area of subacute ischemia. Close follow-up is recommended to exclude underlying metastasis. These results will be called to the ordering clinician or representative by the Radiologist Assistant, and communication documented in the PACS Dashboard.   Electronically Signed   By: Logan Bores   On: 11/08/2013 09:37       IMPRESSION: The patient has a history of metastatic non-small cell lung cancer with a new left cerebellar metastasis. This is a fairly large solitary lesion measuring 4.7 cm. The patient did undergo a SRS protocol brain MRI scan and this did not show any additional lesions. The tumor is adjacent to the brainstem.  I believe that the patient is an appropriate candidate for preoperative radiation treatment. I discussed with the patient the rationale of such a treatment in terms of local control. We also  discussed the possible side effects and risks of treatment as well. The patient looks fairly good today and given his performance status and degree of systemic treatment, I believe that proceeding with aggressive treatment to the solitary mass is warranted.   PLAN: The patient will proceed with a simulation later today such that we can planned for his course of radiosurgery. I anticipate one fraction to 14 gray followed by surgery the following day.    I spent 30 minutes face to face with the patient and more than 50% of that time was spent in counseling and/or coordination of care.    ________________________________   Jodelle Gross, MD, PhD

## 2013-11-17 ENCOUNTER — Ambulatory Visit (HOSPITAL_COMMUNITY): Payer: Medicare Other

## 2013-11-17 ENCOUNTER — Encounter (HOSPITAL_COMMUNITY)
Admission: RE | Admit: 2013-11-17 | Discharge: 2013-11-17 | Disposition: A | Discharge status: Home / Self Care | Payer: Medicare Other | Source: Ambulatory Visit | Attending: Neurosurgery | Admitting: Neurosurgery

## 2013-11-17 ENCOUNTER — Ambulatory Visit
Admission: RE | Admit: 2013-11-17 | Discharge: 2013-11-17 | Disposition: A | Discharge status: Home / Self Care | Payer: Medicare Other | Source: Ambulatory Visit | Attending: Radiation Oncology | Admitting: Radiation Oncology

## 2013-11-17 ENCOUNTER — Ambulatory Visit: Payer: Medicare Other

## 2013-11-17 ENCOUNTER — Ambulatory Visit: Payer: Medicare Other | Admitting: Radiation Oncology

## 2013-11-17 ENCOUNTER — Ambulatory Visit (HOSPITAL_COMMUNITY)
Admission: RE | Admit: 2013-11-17 | Discharge: 2013-11-17 | Disposition: A | Discharge status: Home / Self Care | Payer: Medicare Other | Source: Ambulatory Visit | Attending: Anesthesiology | Admitting: Anesthesiology

## 2013-11-17 ENCOUNTER — Encounter (HOSPITAL_COMMUNITY): Payer: Self-pay

## 2013-11-17 VITALS — BP 144/89 | HR 90 | Temp 97.3°F

## 2013-11-17 DIAGNOSIS — C3490 Malignant neoplasm of unspecified part of unspecified bronchus or lung: Secondary | ICD-10-CM | POA: Insufficient documentation

## 2013-11-17 DIAGNOSIS — J984 Other disorders of lung: Secondary | ICD-10-CM

## 2013-11-17 DIAGNOSIS — D649 Anemia, unspecified: Secondary | ICD-10-CM | POA: Insufficient documentation

## 2013-11-17 DIAGNOSIS — Z87891 Personal history of nicotine dependence: Secondary | ICD-10-CM

## 2013-11-17 DIAGNOSIS — I10 Essential (primary) hypertension: Secondary | ICD-10-CM

## 2013-11-17 DIAGNOSIS — G939 Disorder of brain, unspecified: Secondary | ICD-10-CM | POA: Insufficient documentation

## 2013-11-17 DIAGNOSIS — C7931 Secondary malignant neoplasm of brain: Secondary | ICD-10-CM

## 2013-11-17 DIAGNOSIS — C7949 Secondary malignant neoplasm of other parts of nervous system: Principal | ICD-10-CM

## 2013-11-17 DIAGNOSIS — C787 Secondary malignant neoplasm of liver and intrahepatic bile duct: Secondary | ICD-10-CM | POA: Insufficient documentation

## 2013-11-17 HISTORY — DX: Anemia, unspecified: D64.9

## 2013-11-17 HISTORY — DX: Chronic obstructive pulmonary disease, unspecified: J44.9

## 2013-11-17 LAB — BASIC METABOLIC PANEL
BUN: 16 mg/dL (ref 6–23)
CO2: 21 meq/L (ref 19–32)
CREATININE: 0.68 mg/dL (ref 0.50–1.35)
Calcium: 8.6 mg/dL (ref 8.4–10.5)
Chloride: 96 mEq/L (ref 96–112)
GFR calc Af Amer: >90 mL/min (ref >90)
GFR calc non Af Amer: 90 mL/min — ABNORMAL LOW (ref >90)
GLUCOSE: 169 mg/dL — AB (ref 70–99)
Potassium: 4.5 mEq/L (ref 3.7–5.3)
SODIUM: 133 meq/L — AB (ref 137–147)

## 2013-11-17 LAB — CBC
HCT: 33.5 % — ABNORMAL LOW (ref 39.0–52.0)
Hemoglobin: 10.3 g/dL — ABNORMAL LOW (ref 13.0–17.0)
MCH: 22.9 pg — AB (ref 26.0–34.0)
MCHC: 30.7 g/dL (ref 30.0–36.0)
MCV: 74.6 fL — AB (ref 78.0–100.0)
Platelets: 304 10*3/uL (ref 150–400)
RBC: 4.49 MIL/uL (ref 4.22–5.81)
RDW: 20.4 % — ABNORMAL HIGH (ref 11.5–15.5)
WBC: 35.8 10*3/uL — ABNORMAL HIGH (ref 4.0–10.5)

## 2013-11-17 LAB — ABO/RH: ABO/RH(D): O POS

## 2013-11-17 LAB — TYPE AND SCREEN
ABO/RH(D): O POS
Antibody Screen: NEGATIVE

## 2013-11-17 MED ORDER — CEFAZOLIN SODIUM-DEXTROSE 2-3 GM-% IV SOLR
2.0000 g | INTRAVENOUS | Status: AC
  Administered 2013-11-18: 2 g via INTRAVENOUS
  Filled 2013-11-17: qty 50

## 2013-11-17 NOTE — Pre-Procedure Instructions (Signed)
Mackey Birchwood.  11/17/2013   Your procedure is scheduled on:  Tuesday, November 18, 2013 @ 4:21 PM  Report to [facility] Short Stay (use Main Entrance "A'') at 1:00 PM.  Call this number if you have problems the morning of surgery: [phone]   Remember:   Do not eat food or drink liquids after midnight.   Take these medicines the morning of surgery with A SIP OF WATER: amLODipine (NORVASC) 10 MG tablet, dexamethasone (DECADRON) 4 MG tablet, if needed: albuterol (PROVENTIL HFA;VENTOLIN HFA) 108 (90 BASE) MCG/ACT inhaler ( Bring inhaler in with you on the day of the procedure ).  Do not wear jewelry, make-up or nail polish.  Do not wear lotions, powders, or perfumes. You may wear deodorant.  Do not shave 48 hours prior to surgery. Men may shave face and neck.  Do not bring valuables to the hospital.  [facility] is not responsible for any belongings or valuables.               Contacts, dentures or bridgework may not be worn into surgery.  Leave suitcase in the car. After surgery it may be brought to your room.  For patients admitted to the hospital, discharge time is determined by your treatment team.               Patients discharged the day of surgery will not be allowed to drive home.  Name and phone number of your driver:   Special Instructions: Shower using CHG 2 nights before surgery and the night before surgery.  If you shower the day of surgery use CHG.  Use special wash - you have one bottle of CHG for all showers.  You should use approximately 1/3 of the bottle for each shower.   Please read over the following fact sheets that you were given: Pain Booklet, Blood Transfusion Information and Surgical Site Infection Prevention

## 2013-11-17 NOTE — H&P (Signed)
[place], [place] [address] Phone: [phone]   Patient ID:   [id number] Patient: Cameron Lam  Date of Birth: 03/22/1934 Visit Type: Office Visit   Date: 11/12/2013 08:45 AM Provider: Marchia Meiers. Vertell Limber MD   This 80 year old male presents for abnormal study.  HISTORY OF PRESENT ILLNESS: 1.  abnormal study   [name], [phone]. male, visits in follow up of brain MRI.  Lung CA dx June 2014, Hepatic & Spenic lesions discovered with f/u imaging late 2014, and brain mass Jan 2015 after pt c/o headaches & balance issues in Dec.  Chemo since June 2014.  Hx: HTN, COPD, Lung CA June '14, Appendectomy 7yrs ago.  (Pt continues to smoke 3cigarettes/day, acknowledging ill health effects.)  Decadron 4mg  QID has relieved headaches  MRI & CT's on [place]  Patient has noted increased in balance after chemotherapy which led to the MRI of his brain in which a large left cerebellar metastatic deposit was identified.  Patient is not eager to have surgery but I explained to him that without surgery he would likely die from this large metastasis in the posterior fossa and that our best potential for control of his brain metastases would be a comminution of stereotactic radiosurgery followed by surgery.  I did not think that radiation alone would be sufficient to control this.  He says he is doing better since he started steroids.     PAST MEDICAL/SURGICAL HISTORY  (Detailed)  Disease/disorder Onset Date Management Date Comments  Cancer, lung      Hypertension          PAST MEDICAL HISTORY, SURGICAL HISTORY, FAMILY HISTORY, SOCIAL HISTORY AND REVIEW OF SYSTEMS I have reviewed the patient's past medical, surgical, family and social history as well as the comprehensive review of systems as included on the [facility] history form dated 11/12/2013, which I have signed.  Family History  (Detailed)  Relationship Family Member Name  Deceased Age at Death Condition Onset Age Cause of Death      Family history of Hypertension  N   SOCIAL HISTORY  (Detailed) Tobacco use reviewed. Preferred language is [place].   Smoking status: Light tobacco smoker.  SMOKING STATUS Use Status Type Smoking Status Usage Per Day Years Used Total Pack Years  yes Cigarette Light tobacco smoker 3 Cigarettes     TOBACCO CESSATION INFORMATION Date Counseled By Order Status Description Code Tobacco Cessation Information  11/12/2013      Smoking cessation education          MEDICATIONS(added, continued or stopped this visit):   Medication Dose Prescribed Else Ind Started Stopped  albuterol sulfate HFA 90 mcg/actuation aerosol inhaler 90 mcg Y    amlodipine 10 mg tablet 10 mg Y    aspirin 81 mg tablet,delayed release 81 mg Y    dexamethasone 4 mg tablet 4 mg Y    ferrous sulfate 325 mg (65 mg iron) tablet 325 mg (65 mg iron) Y    multivitamin tablet  Y    prochlorperazine maleate 10 mg tablet 10 mg Y      ALLERGIES:  Ingredient Reaction Medication Name Comment  NO KNOWN ALLERGIES     No known allergies.  REVIEW OF SYSTEMS: System Neg/Pos Details  Constitutional Negative Chills, fatigue, fever, malaise, night sweats, weight gain and weight loss.  ENMT Negative Ear drainage, hearing loss, nasal drainage, otalgia, sinus pressure and sore throat.  Eyes Negative Eye discharge, eye pain and  vision changes.  Respiratory Positive Diminished lung sounds bilat.  Cardio Negative Chest pain, claudication, edema and irregular heartbeat/palpitations.  GI Negative Abdominal pain, blood in stool, change in stool pattern, constipation, decreased appetite, diarrhea, heartburn, nausea and vomiting.  GU Negative Dribbling, dysuria, erectile dysfunction, hematuria, polyuria, slow stream, urinary frequency, urinary incontinence and urinary retention.  Endocrine Negative Cold intolerance, heat intolerance, polydipsia and polyphagia.  Neuro  Positive Dizziness.  Psych Negative Anxiety, depression and insomnia.  Integumentary Negative Brittle hair, brittle nails, change in shape/size of mole(s), hair loss, hirsutism, hives, pruritus, rash and skin lesion.  MS Negative Back pain, joint pain, joint swelling, muscle weakness and neck pain.  Hema/Lymph Negative Easy bleeding, easy bruising and lymphadenopathy.  Allergic/Immuno Negative Contact allergy, environmental allergies, food allergies and seasonal allergies.  Reproductive Negative Penile discharge and sexual dysfunction.    Vitals Date Temp F BP Pulse Ht In Wt Lb BMI BSA Pain Score  11/12/2013  151/88 88 70 135 19.37  0/10     PHYSICAL EXAM General Level of Distress: no acute distress Overall Appearance: normal  Head and Face  Right Left  Fundoscopic Exam:  normal normal    Cardiovascular Cardiac: regular rate and rhythm without murmur  Right Left  Carotid Pulses: normal normal  Respiratory Lungs: clear to auscultation  Neurological Orientation: normal Recent and Remote Memory: normal Attention Span and Concentration:   normal Language: normal Fund of Knowledge: normal  Right Left Sensation: normal normal Upper Extremity Coordination: normal dysmetria  Lower Extremity Coordination: normal dysmetria  Musculoskeletal Gait and Station: normal  Right Left Upper Extremity Muscle Strength: normal normal Lower Extremity Muscle Strength: normal normal Upper Extremity Muscle Tone:  normal normal Lower Extremity Muscle Tone: normal normal  Motor Strength Upper and lower extremity motor strength was tested in the clinically pertinent muscles.   Deep Tendon Reflexes  Right Left Biceps: normal normal Triceps: normal normal Brachiloradialis: normal normal Patellar: normal normal Achilles: normal normal  Cranial Nerves II. Optic Nerve/Visual Fields: normal III. Oculomotor: normal IV. Trochlear: normal V. Trigeminal: normal VI.  Abducens: normal VII. Facial: normal VIII. Acoustic/Vestibular: normal IX. Glossopharyngeal: normal X. Vagus: normal XI. Spinal Accessory: normal XII. Hypoglossal: normal  Motor and other Tests Lhermittes: negative Rhomberg: negative Pronator drift: absent     Right Left Hoffman's: normal normal Clonus: normal normal Babinski: normal normal      DIAGNOSTIC RESULTS Diagnostic report text  CLINICAL DATA: Stealth protocol. Brain tumor.  EXAM: MRI HEAD WITHOUT AND WITH CONTRAST  TECHNIQUE: Multiplanar, multiecho pulse sequences of the brain and surrounding structures were obtained without and with intravenous contrast.  CONTRAST: 26mL MULTIHANCE GADOBENATE DIMEGLUMINE 529 MG/ML IV SOLN  COMPARISON: Most recent 11/07/2013.  FINDINGS: Again noted is a 4.4 x 4.5 x 3.0 cm left cerebellar hemispheric mass involving inferior vermis and fourth ventricle slight midline shift. Impending obstructive hydrocephalus. Peripheral enhancement with central necrosis. Mild surrounding vasogenic edema. No appreciable worsening from priors.  Re-demonstrated is a similar gyriform enhancement right parietal lobe not typical for metastasis, likely representing subacute ischemia.  No other interval changes from 11/07/2013. No new lesions are seen.  IMPRESSION: Solitary left cerebellar metastasis. Significant involvement of the vermis with tumor extending into the fourth ventricle. The patient is at risk for acute obstructive hydrocephalus with only slight increased in size of the mass.  Suspect subacute right parietal MCA territory gyriform infarct.   Electronically Signed By: Rolla Flatten M.D. On: 11/13/2013 07:48    IMPRESSION Patient has a large left cerebellar  metastasis.  He has an area of normal enhancement in the right parietal MCA territory which was felt to most likely represent an infarct.  Completed Orders (this encounter) Order Details Reason Side Interpretation  Result Initial Treatment Date Region  Lifestyle education f/u with pcp         Assessment/Plan # Detail Type Description   1. Assessment Hypertension, Unspecified (401.9).       2. Assessment Lung cancer (162.9).       3. Assessment Brain tumor (239.6).        Pain Assessment/Treatment Pain Scale: 0/10. Method: Numeric Pain Intensity Scale.  Stereotactic radiosurgery preoperatively to the large left cerebellar lesion followed by suboccipital craniectomy and tumor resection.  Risks and benefits were discussed in detail with the patient and with his daughters.  They wish to proceed.  Orders: Instruction(s)/Education: Assessment Instruction  401.9 Lifestyle education             Provider:  Marchia Meiers. Vertell Limber MD  11/15/2013 04:53 PM Dictation edited by: Marchia Meiers. Vertell Limber    CC Providers: [facility] [place], [place] [address]- ----------------------------------------------------------------------------------------------------------------------------------------------------------------------         Electronically signed by Marchia Meiers. Vertell Limber MD on 11/15/2013 04:53 PM

## 2013-11-17 NOTE — Progress Notes (Signed)
Dr. Oletta Lamas ( anesthesia) made aware that pt WBC was 35.8; MD advised that I notify Dr. Vertell Limber. Left message with answering service for Dr. Ellene Route who is on call.

## 2013-11-17 NOTE — Progress Notes (Addendum)
Anesthesia PAT Evaluation:  Patient is a 80 year old male scheduled for craniotomy for tumor excision tomorrow.  His PAT visit was 3 PM today. He presents with his daughter.  History includes stage IV NSC lung cancer with large left lower lobe lung mass and liver metastasis diagnosed in 03/2013.  He is s/p chemoradiation. Brain MRI was done on 11/07/13 for c/o of dysequilibrium and a 4.7 cm left cerebellar mass was noted.  He was started on Decadron and referred to neurosurgery for resection. Other history includes 90 pack years smoking (quit ~ 9 months ago), HTN. PCP is Dr. Joseph Art from [facility].  HEM-ONC is Dr. Julien Nordmann. Pulmonologist is Dr. Halford Chessman.   EKG on 04/05/13 showed SR, PAC's, borderline right axis deviation, consider LVH, consider anterior infarct (age undetermined). Currently, there are no comparison EKGs available.   CXR on 11/17/13 showed: 1. Hyperinflation with coarse left lower lung interstitial opacities, no focal consolidation or mass.   He denies chest pain, SOB, significant DOE, edema, known CAD/MI/CHF, or DM.  He denies fever, recent URI, hemoptysis, or persistent cough.  He denies prior cardiac testing.  Within the past several months he has been able to do yard work such as trimming shrubs without any CV symptoms. On exam, he is a very pleasant man in NAD.  Heart RRR, no murmur noted.  Lungs diminished, but otherwise clear.  No LE edema noted.  Preoperative labs noted. H/H 10.3/33.5, up from 9.0/29.4.  T&S already done.  Glucose is 169.  He has no history of DM, but has been on Decadron. LFTS were WNL 10/22/13.  Patient with stage IV lung cancer with brain and liver mets.  He denies any acute CV symptoms.  Anemia is stable. WBC is pending--PAT RN to follow-up. Anticipate that he can proceed as planned.  George Hugh [facility] Short Stay Center/Anesthesiology Phone [phone] 11/17/2013 5:29 PM

## 2013-11-17 NOTE — Progress Notes (Signed)
Patient to nursing for one hour observation post WVP:XTGGY.Tolerated well.Denies pain, headache or nausea."I feel normal."Takes dexamethasone 4 mg qid.Appetite good.Given a coke.Daughter sitting with patient and instructed to push call bell or come to nursing station if patient has any needs.

## 2013-11-18 ENCOUNTER — Inpatient Hospital Stay (HOSPITAL_COMMUNITY)
Admission: RE | Admit: 2013-11-18 | Discharge: 2013-11-27 | DRG: 025 | Disposition: A | Discharge status: Home / Self Care | Payer: Medicare Other | Source: Ambulatory Visit | Attending: Neurosurgery | Admitting: Neurosurgery

## 2013-11-18 ENCOUNTER — Ambulatory Visit (HOSPITAL_COMMUNITY): Payer: Medicare Other

## 2013-11-18 ENCOUNTER — Inpatient Hospital Stay (HOSPITAL_COMMUNITY): Payer: Medicare Other

## 2013-11-18 ENCOUNTER — Encounter (HOSPITAL_COMMUNITY): Payer: Medicare Other | Admitting: Vascular Surgery

## 2013-11-18 ENCOUNTER — Inpatient Hospital Stay (HOSPITAL_COMMUNITY): Payer: Medicare Other | Admitting: Certified Registered"

## 2013-11-18 ENCOUNTER — Encounter (HOSPITAL_COMMUNITY)
Admission: RE | Disposition: A | Discharge status: Home / Self Care | Payer: Self-pay | Source: Ambulatory Visit | Attending: Neurosurgery

## 2013-11-18 DIAGNOSIS — C7931 Secondary malignant neoplasm of brain: Secondary | ICD-10-CM | POA: Diagnosis present

## 2013-11-18 DIAGNOSIS — Z923 Personal history of irradiation: Secondary | ICD-10-CM

## 2013-11-18 DIAGNOSIS — Z515 Encounter for palliative care: Secondary | ICD-10-CM

## 2013-11-18 DIAGNOSIS — J449 Chronic obstructive pulmonary disease, unspecified: Secondary | ICD-10-CM

## 2013-11-18 DIAGNOSIS — J441 Chronic obstructive pulmonary disease with (acute) exacerbation: Secondary | ICD-10-CM | POA: Diagnosis present

## 2013-11-18 DIAGNOSIS — R51 Headache: Secondary | ICD-10-CM | POA: Diagnosis present

## 2013-11-18 DIAGNOSIS — Z66 Do not resuscitate: Secondary | ICD-10-CM | POA: Diagnosis present

## 2013-11-18 DIAGNOSIS — C7949 Secondary malignant neoplasm of other parts of nervous system: Principal | ICD-10-CM

## 2013-11-18 DIAGNOSIS — J690 Pneumonitis due to inhalation of food and vomit: Secondary | ICD-10-CM | POA: Diagnosis present

## 2013-11-18 DIAGNOSIS — R0902 Hypoxemia: Secondary | ICD-10-CM | POA: Diagnosis present

## 2013-11-18 DIAGNOSIS — I10 Essential (primary) hypertension: Secondary | ICD-10-CM | POA: Diagnosis present

## 2013-11-18 DIAGNOSIS — J9600 Acute respiratory failure, unspecified whether with hypoxia or hypercapnia: Secondary | ICD-10-CM | POA: Diagnosis not present

## 2013-11-18 DIAGNOSIS — C787 Secondary malignant neoplasm of liver and intrahepatic bile duct: Secondary | ICD-10-CM | POA: Diagnosis present

## 2013-11-18 DIAGNOSIS — R7309 Other abnormal glucose: Secondary | ICD-10-CM | POA: Diagnosis not present

## 2013-11-18 DIAGNOSIS — I4891 Unspecified atrial fibrillation: Secondary | ICD-10-CM | POA: Diagnosis not present

## 2013-11-18 DIAGNOSIS — R1310 Dysphagia, unspecified: Secondary | ICD-10-CM

## 2013-11-18 DIAGNOSIS — C3490 Malignant neoplasm of unspecified part of unspecified bronchus or lung: Secondary | ICD-10-CM | POA: Diagnosis present

## 2013-11-18 DIAGNOSIS — F17200 Nicotine dependence, unspecified, uncomplicated: Secondary | ICD-10-CM | POA: Diagnosis present

## 2013-11-18 DIAGNOSIS — E871 Hypo-osmolality and hyponatremia: Secondary | ICD-10-CM | POA: Diagnosis present

## 2013-11-18 DIAGNOSIS — J189 Pneumonia, unspecified organism: Secondary | ICD-10-CM

## 2013-11-18 DIAGNOSIS — D638 Anemia in other chronic diseases classified elsewhere: Secondary | ICD-10-CM | POA: Diagnosis present

## 2013-11-18 HISTORY — PX: CRANIOTOMY: SHX93

## 2013-11-18 SURGERY — CRANIOTOMY TUMOR EXCISION
Anesthesia: General | Site: Head

## 2013-11-18 MED ORDER — DEXAMETHASONE SODIUM PHOSPHATE 4 MG/ML IJ SOLN
4.0000 mg | Freq: Four times a day (QID) | INTRAMUSCULAR | Status: AC
  Administered 2013-11-20 (×4): 4 mg via INTRAVENOUS
  Filled 2013-11-18 (×4): qty 1

## 2013-11-18 MED ORDER — PROMETHAZINE HCL 25 MG PO TABS: 12.5000 mg | ORAL_TABLET | ORAL | Status: DC | PRN

## 2013-11-18 MED ORDER — BUPIVACAINE HCL (PF) 0.5 % IJ SOLN
INTRAMUSCULAR | Status: DC | PRN
  Administered 2013-11-18: 10 mL

## 2013-11-18 MED ORDER — HEMOSTATIC AGENTS (NO CHARGE) OPTIME
TOPICAL | Status: DC | PRN
  Administered 2013-11-18: 1 via TOPICAL

## 2013-11-18 MED ORDER — ENSURE COMPLETE PO LIQD
237.0000 mL | Freq: Two times a day (BID) | ORAL | Status: DC
  Administered 2013-11-19 – 2013-11-27 (×15): 237 mL via ORAL

## 2013-11-18 MED ORDER — FLEET ENEMA 7-19 GM/118ML RE ENEM
1.0000 | ENEMA | Freq: Once | RECTAL | Status: AC | PRN
  Filled 2013-11-18: qty 1

## 2013-11-18 MED ORDER — THROMBIN 20000 UNITS EX SOLR
CUTANEOUS | Status: DC | PRN
  Administered 2013-11-18: 19:00:00 via TOPICAL

## 2013-11-18 MED ORDER — LIDOCAINE HCL (CARDIAC) 20 MG/ML IV SOLN
INTRAVENOUS | Status: DC | PRN
  Administered 2013-11-18: 40 mg via INTRAVENOUS

## 2013-11-18 MED ORDER — DEXAMETHASONE SODIUM PHOSPHATE 10 MG/ML IJ SOLN
INTRAMUSCULAR | Status: DC | PRN
  Administered 2013-11-18: 10 mg via INTRAVENOUS

## 2013-11-18 MED ORDER — NEOSTIGMINE METHYLSULFATE 1 MG/ML IJ SOLN
INTRAMUSCULAR | Status: DC | PRN
  Administered 2013-11-18: 5 mg via INTRAVENOUS

## 2013-11-18 MED ORDER — PANTOPRAZOLE SODIUM 40 MG IV SOLR
40.0000 mg | Freq: Every day | INTRAVENOUS | Status: DC
  Administered 2013-11-18 – 2013-11-22 (×5): 40 mg via INTRAVENOUS
  Filled 2013-11-18 (×8): qty 40

## 2013-11-18 MED ORDER — OXYCODONE HCL 5 MG/5ML PO SOLN: 5.0000 mg | Freq: Once | ORAL | Status: DC | PRN

## 2013-11-18 MED ORDER — CEFAZOLIN SODIUM 1-5 GM-% IV SOLN
1.0000 g | Freq: Three times a day (TID) | INTRAVENOUS | Status: AC
  Administered 2013-11-19 (×2): 1 g via INTRAVENOUS
  Filled 2013-11-18 (×2): qty 50

## 2013-11-18 MED ORDER — FENTANYL CITRATE 0.05 MG/ML IJ SOLN
INTRAMUSCULAR | Status: AC
  Filled 2013-11-18: qty 5

## 2013-11-18 MED ORDER — HYDROMORPHONE HCL PF 1 MG/ML IJ SOLN: 0.2500 mg | INTRAMUSCULAR | Status: DC | PRN

## 2013-11-18 MED ORDER — STERILE WATER FOR INJECTION IJ SOLN
INTRAMUSCULAR | Status: AC
  Filled 2013-11-18: qty 10

## 2013-11-18 MED ORDER — LIDOCAINE HCL (CARDIAC) 20 MG/ML IV SOLN
INTRAVENOUS | Status: AC
  Filled 2013-11-18: qty 5

## 2013-11-18 MED ORDER — DEXAMETHASONE SODIUM PHOSPHATE 10 MG/ML IJ SOLN
INTRAMUSCULAR | Status: AC
  Filled 2013-11-18: qty 1

## 2013-11-18 MED ORDER — LACTATED RINGERS IV SOLN
INTRAVENOUS | Status: DC
  Administered 2013-11-18: 14:00:00 via INTRAVENOUS

## 2013-11-18 MED ORDER — SODIUM CHLORIDE 0.9 % IR SOLN
Status: DC | PRN
  Administered 2013-11-18: 1000 mL

## 2013-11-18 MED ORDER — DOCUSATE SODIUM 100 MG PO CAPS
100.0000 mg | ORAL_CAPSULE | Freq: Two times a day (BID) | ORAL | Status: DC
  Administered 2013-11-19 – 2013-11-26 (×13): 100 mg via ORAL
  Filled 2013-11-18 (×19): qty 1

## 2013-11-18 MED ORDER — ONDANSETRON HCL 4 MG/2ML IJ SOLN
INTRAMUSCULAR | Status: AC
  Filled 2013-11-18: qty 2

## 2013-11-18 MED ORDER — DEXAMETHASONE SODIUM PHOSPHATE 4 MG/ML IJ SOLN
4.0000 mg | Freq: Three times a day (TID) | INTRAMUSCULAR | Status: DC
  Administered 2013-11-20 – 2013-11-25 (×14): 4 mg via INTRAVENOUS
  Filled 2013-11-18 (×19): qty 1

## 2013-11-18 MED ORDER — BISACODYL 10 MG RE SUPP: 10.0000 mg | Freq: Every day | RECTAL | Status: DC | PRN

## 2013-11-18 MED ORDER — LACTATED RINGERS IV SOLN
INTRAVENOUS | Status: DC | PRN
  Administered 2013-11-18: 18:00:00 via INTRAVENOUS

## 2013-11-18 MED ORDER — BACITRACIN ZINC 500 UNIT/GM EX OINT
TOPICAL_OINTMENT | CUTANEOUS | Status: DC | PRN
  Administered 2013-11-18: 1 via TOPICAL

## 2013-11-18 MED ORDER — POTASSIUM CHLORIDE IN NACL 20-0.9 MEQ/L-% IV SOLN
INTRAVENOUS | Status: DC
  Administered 2013-11-18 – 2013-11-20 (×4): via INTRAVENOUS
  Filled 2013-11-18 (×7): qty 1000

## 2013-11-18 MED ORDER — SENNA 8.6 MG PO TABS
1.0000 | ORAL_TABLET | Freq: Two times a day (BID) | ORAL | Status: DC
  Administered 2013-11-19 – 2013-11-25 (×9): 8.6 mg via ORAL
  Filled 2013-11-18 (×14): qty 1

## 2013-11-18 MED ORDER — FERROUS SULFATE 325 (65 FE) MG PO TABS
325.0000 mg | ORAL_TABLET | Freq: Two times a day (BID) | ORAL | Status: DC
  Administered 2013-11-19 – 2013-11-25 (×13): 325 mg via ORAL
  Filled 2013-11-18 (×16): qty 1

## 2013-11-18 MED ORDER — SODIUM CHLORIDE 0.9 % IV SOLN
500.0000 mg | Freq: Two times a day (BID) | INTRAVENOUS | Status: DC
  Administered 2013-11-18 – 2013-11-24 (×11): 500 mg via INTRAVENOUS
  Filled 2013-11-18 (×13): qty 5

## 2013-11-18 MED ORDER — MIDAZOLAM HCL 2 MG/2ML IJ SOLN
INTRAMUSCULAR | Status: AC
Start: 2013-11-18 — End: 2013-11-18
  Filled 2013-11-18: qty 2

## 2013-11-18 MED ORDER — SODIUM CHLORIDE 0.9 % IV SOLN
10.0000 mg | INTRAVENOUS | Status: DC | PRN
  Administered 2013-11-18: 5 ug/min via INTRAVENOUS

## 2013-11-18 MED ORDER — ROCURONIUM BROMIDE 50 MG/5ML IV SOLN
INTRAVENOUS | Status: AC
  Filled 2013-11-18: qty 1

## 2013-11-18 MED ORDER — PROPOFOL 10 MG/ML IV BOLUS
INTRAVENOUS | Status: AC
  Filled 2013-11-18: qty 20

## 2013-11-18 MED ORDER — LABETALOL HCL 5 MG/ML IV SOLN
INTRAVENOUS | Status: DC | PRN
  Administered 2013-11-18: 2.5 mg via INTRAVENOUS
  Administered 2013-11-18: 5 mg via INTRAVENOUS
  Administered 2013-11-18: 2.5 mg via INTRAVENOUS

## 2013-11-18 MED ORDER — LACTATED RINGERS IV SOLN
INTRAVENOUS | Status: DC | PRN
  Administered 2013-11-18 (×2): via INTRAVENOUS

## 2013-11-18 MED ORDER — ONDANSETRON HCL 4 MG/2ML IJ SOLN
INTRAMUSCULAR | Status: DC | PRN
  Administered 2013-11-18: 4 mg via INTRAVENOUS

## 2013-11-18 MED ORDER — THROMBIN 5000 UNITS EX SOLR
OROMUCOSAL | Status: DC | PRN
  Administered 2013-11-18: 20:00:00 via TOPICAL

## 2013-11-18 MED ORDER — GLYCOPYRROLATE 0.2 MG/ML IJ SOLN
INTRAMUSCULAR | Status: DC | PRN
  Administered 2013-11-18: .8 mg via INTRAVENOUS

## 2013-11-18 MED ORDER — LIDOCAINE-EPINEPHRINE 1 %-1:100000 IJ SOLN
INTRAMUSCULAR | Status: DC | PRN
  Administered 2013-11-18: 10 mL

## 2013-11-18 MED ORDER — OXYCODONE HCL 5 MG PO TABS
5.0000 mg | ORAL_TABLET | Freq: Once | ORAL | Status: DC | PRN
Start: 2013-11-18 — End: 2013-11-18

## 2013-11-18 MED ORDER — ACETAMINOPHEN 650 MG RE SUPP: 650.0000 mg | RECTAL | Status: DC | PRN

## 2013-11-18 MED ORDER — DEXAMETHASONE SODIUM PHOSPHATE 10 MG/ML IJ SOLN
6.0000 mg | Freq: Four times a day (QID) | INTRAMUSCULAR | Status: AC
  Administered 2013-11-18 – 2013-11-19 (×4): 6 mg via INTRAVENOUS
  Filled 2013-11-18: qty 0.6
  Filled 2013-11-18: qty 1
  Filled 2013-11-18: qty 0.6
  Filled 2013-11-18: qty 1

## 2013-11-18 MED ORDER — AMLODIPINE BESYLATE 10 MG PO TABS
10.0000 mg | ORAL_TABLET | Freq: Every day | ORAL | Status: DC
  Administered 2013-11-19 – 2013-11-24 (×6): 10 mg via ORAL
  Filled 2013-11-18 (×8): qty 1

## 2013-11-18 MED ORDER — ALBUTEROL SULFATE (2.5 MG/3ML) 0.083% IN NEBU: 2.5000 mg | INHALATION_SOLUTION | Freq: Four times a day (QID) | RESPIRATORY_TRACT | Status: DC | PRN

## 2013-11-18 MED ORDER — FENTANYL CITRATE 0.05 MG/ML IJ SOLN
INTRAMUSCULAR | Status: DC | PRN
  Administered 2013-11-18 (×2): 50 ug via INTRAVENOUS
  Administered 2013-11-18: 25 ug via INTRAVENOUS
  Administered 2013-11-18 (×2): 50 ug via INTRAVENOUS
  Administered 2013-11-18: 100 ug via INTRAVENOUS

## 2013-11-18 MED ORDER — 0.9 % SODIUM CHLORIDE (POUR BTL) OPTIME
TOPICAL | Status: DC | PRN
  Administered 2013-11-18 (×2): 1000 mL

## 2013-11-18 MED ORDER — DEXAMETHASONE 4 MG PO TABS: 4.0000 mg | ORAL_TABLET | Freq: Four times a day (QID) | ORAL | Status: DC

## 2013-11-18 MED ORDER — ONDANSETRON HCL 4 MG PO TABS
4.0000 mg | ORAL_TABLET | ORAL | Status: DC | PRN
  Administered 2013-11-22: 4 mg via ORAL
  Filled 2013-11-18: qty 1

## 2013-11-18 MED ORDER — ADULT MULTIVITAMIN W/MINERALS CH
1.0000 | ORAL_TABLET | Freq: Every day | ORAL | Status: DC
  Administered 2013-11-19 – 2013-11-25 (×7): 1 via ORAL
  Filled 2013-11-18 (×8): qty 1

## 2013-11-18 MED ORDER — ROCURONIUM BROMIDE 100 MG/10ML IV SOLN
INTRAVENOUS | Status: DC | PRN
  Administered 2013-11-18: 10 mg via INTRAVENOUS
  Administered 2013-11-18: 50 mg via INTRAVENOUS
  Administered 2013-11-18: 5 mg via INTRAVENOUS

## 2013-11-18 MED ORDER — MIDAZOLAM HCL 5 MG/5ML IJ SOLN
INTRAMUSCULAR | Status: DC | PRN
  Administered 2013-11-18: .5 mg via INTRAVENOUS

## 2013-11-18 MED ORDER — PROPOFOL 10 MG/ML IV BOLUS
INTRAVENOUS | Status: DC | PRN
  Administered 2013-11-18: 150 mg via INTRAVENOUS
  Administered 2013-11-18: 50 mg via INTRAVENOUS

## 2013-11-18 MED ORDER — MORPHINE SULFATE 2 MG/ML IJ SOLN
1.0000 mg | INTRAMUSCULAR | Status: DC | PRN
  Administered 2013-11-19: 1 mg via INTRAVENOUS
  Administered 2013-11-19 – 2013-11-20 (×3): 2 mg via INTRAVENOUS
  Filled 2013-11-18 (×5): qty 1

## 2013-11-18 MED ORDER — LABETALOL HCL 5 MG/ML IV SOLN
10.0000 mg | INTRAVENOUS | Status: DC | PRN
  Administered 2013-11-18: 10 mg via INTRAVENOUS
  Administered 2013-11-18: 5 mg via INTRAVENOUS
  Administered 2013-11-19: 10 mg via INTRAVENOUS
  Filled 2013-11-18 (×2): qty 8
  Filled 2013-11-18: qty 4

## 2013-11-18 MED ORDER — PROCHLORPERAZINE MALEATE 10 MG PO TABS
10.0000 mg | ORAL_TABLET | Freq: Four times a day (QID) | ORAL | Status: DC | PRN
  Filled 2013-11-18: qty 1

## 2013-11-18 MED ORDER — ACETAMINOPHEN 325 MG PO TABS: 650.0000 mg | ORAL_TABLET | ORAL | Status: DC | PRN

## 2013-11-18 MED ORDER — ONDANSETRON HCL 4 MG/2ML IJ SOLN
4.0000 mg | INTRAMUSCULAR | Status: DC | PRN
  Administered 2013-11-18 – 2013-11-23 (×9): 4 mg via INTRAVENOUS
  Filled 2013-11-18 (×9): qty 2

## 2013-11-18 MED ORDER — HYDROCODONE-ACETAMINOPHEN 5-325 MG PO TABS
1.0000 | ORAL_TABLET | ORAL | Status: DC | PRN
  Administered 2013-11-19 – 2013-11-27 (×8): 1 via ORAL
  Filled 2013-11-18 (×8): qty 1

## 2013-11-18 MED ORDER — POLYETHYLENE GLYCOL 3350 17 G PO PACK
17.0000 g | PACK | Freq: Every day | ORAL | Status: DC | PRN
  Filled 2013-11-18: qty 1

## 2013-11-18 MED ORDER — MICROFIBRILLAR COLL HEMOSTAT EX PADS
MEDICATED_PAD | CUTANEOUS | Status: DC | PRN
  Administered 2013-11-18: 1 via TOPICAL

## 2013-11-18 SURGICAL SUPPLY — 100 items
BANDAGE GAUZE 4  KLING STR (GAUZE/BANDAGES/DRESSINGS) IMPLANT
BIT DRILL WIRE PASS 1.3MM (BIT) IMPLANT
BLADE SURG ROTATE 9660 (MISCELLANEOUS) ×3 IMPLANT
BRUSH SCRUB EZ 1% IODOPHOR (MISCELLANEOUS) IMPLANT
BRUSH SCRUB EZ PLAIN DRY (MISCELLANEOUS) ×3 IMPLANT
BUR ACORN 6.0 PRECISION (BURR) ×2 IMPLANT
BUR ACORN 6.0MM PRECISION (BURR) ×1
BUR ADDG 1.1 (BURR) IMPLANT
BUR ADDG 1.1MM (BURR)
BUR ROUND FLUTED 5 RND (BURR) ×2 IMPLANT
BUR ROUND FLUTED 5MM RND (BURR) ×1
BUR ROUTER D-58 CRANI (BURR) ×3 IMPLANT
CANISTER SUCT 3000ML (MISCELLANEOUS) ×6 IMPLANT
CLIP TI MEDIUM 6 (CLIP) IMPLANT
CONT SPEC 4OZ CLIKSEAL STRL BL (MISCELLANEOUS) ×6 IMPLANT
CORDS BIPOLAR (ELECTRODE) ×3 IMPLANT
COVER MAYO STAND STRL (DRAPES) IMPLANT
DRAIN SNY WOU 7FLT (WOUND CARE) IMPLANT
DRAPE LAPAROTOMY 100X72 PEDS (DRAPES) ×3 IMPLANT
DRAPE MICROSCOPE LEICA (MISCELLANEOUS) ×3 IMPLANT
DRAPE NEUROLOGICAL W/INCISE (DRAPES) IMPLANT
DRAPE PROXIMA HALF (DRAPES) ×3 IMPLANT
DRAPE STERI IOBAN 125X83 (DRAPES) IMPLANT
DRAPE WARM FLUID 44X44 (DRAPE) ×3 IMPLANT
DRESSING TELFA 8X3 (GAUZE/BANDAGES/DRESSINGS) IMPLANT
DRILL WIRE PASS 1.3MM (BIT)
DRSG OPSITE 4X5.5 SM (GAUZE/BANDAGES/DRESSINGS) IMPLANT
DRSG OPSITE POSTOP 4X6 (GAUZE/BANDAGES/DRESSINGS) ×3 IMPLANT
DURAPREP 6ML APPLICATOR 50/CS (WOUND CARE) ×6 IMPLANT
ELECT REM PT RETURN 9FT ADLT (ELECTROSURGICAL) ×3
ELECTRODE REM PT RTRN 9FT ADLT (ELECTROSURGICAL) ×1 IMPLANT
EVACUATOR 1/8 PVC DRAIN (DRAIN) IMPLANT
EVACUATOR SILICONE 100CC (DRAIN) IMPLANT
FORCEPS BIPOLAR SPETZLER 8 1.0 (NEUROSURGERY SUPPLIES) ×3 IMPLANT
GAUZE SPONGE 4X4 16PLY XRAY LF (GAUZE/BANDAGES/DRESSINGS) IMPLANT
GLOVE BIO SURGEON STRL SZ 6.5 (GLOVE) ×4 IMPLANT
GLOVE BIO SURGEON STRL SZ8 (GLOVE) ×3 IMPLANT
GLOVE BIO SURGEONS STRL SZ 6.5 (GLOVE) ×2
GLOVE BIOGEL PI IND STRL 7.0 (GLOVE) ×2 IMPLANT
GLOVE BIOGEL PI IND STRL 8 (GLOVE) ×1 IMPLANT
GLOVE BIOGEL PI IND STRL 8.5 (GLOVE) ×1 IMPLANT
GLOVE BIOGEL PI INDICATOR 7.0 (GLOVE) ×4
GLOVE BIOGEL PI INDICATOR 8 (GLOVE) ×2
GLOVE BIOGEL PI INDICATOR 8.5 (GLOVE) ×2
GLOVE ECLIPSE 6.5 STRL STRAW (GLOVE) ×3 IMPLANT
GLOVE ECLIPSE 7.5 STRL STRAW (GLOVE) ×3 IMPLANT
GLOVE ECLIPSE 8.0 STRL XLNG CF (GLOVE) IMPLANT
GLOVE EXAM NITRILE LRG STRL (GLOVE) IMPLANT
GLOVE EXAM NITRILE MD LF STRL (GLOVE) ×6 IMPLANT
GLOVE EXAM NITRILE XL STR (GLOVE) IMPLANT
GLOVE EXAM NITRILE XS STR PU (GLOVE) IMPLANT
GLOVE SURG SS PI 6.5 STRL IVOR (GLOVE) ×6 IMPLANT
GOWN BRE IMP SLV AUR LG STRL (GOWN DISPOSABLE) IMPLANT
GOWN BRE IMP SLV AUR XL STRL (GOWN DISPOSABLE) IMPLANT
GOWN L4 LG 24 PK N/S (GOWN DISPOSABLE) ×12 IMPLANT
GOWN STRL REIN 2XL LVL4 (GOWN DISPOSABLE) IMPLANT
GOWN STRL REUS W/TWL 2XL LVL3 (GOWN DISPOSABLE) ×3 IMPLANT
HEMOSTAT SURGICEL 2X14 (HEMOSTASIS) ×3 IMPLANT
HOOK DURA (MISCELLANEOUS) ×3 IMPLANT
IV NS 1000ML (IV SOLUTION) ×2
IV NS 1000ML BAXH (IV SOLUTION) ×1 IMPLANT
KIT BASIN OR (CUSTOM PROCEDURE TRAY) ×3 IMPLANT
KIT ROOM TURNOVER OR (KITS) ×3 IMPLANT
MARKER SKIN DUAL TIP RULER LAB (MISCELLANEOUS) IMPLANT
NEEDLE HYPO 25X1 1.5 SAFETY (NEEDLE) ×3 IMPLANT
NS IRRIG 1000ML POUR BTL (IV SOLUTION) ×6 IMPLANT
PACK CRANIOTOMY (CUSTOM PROCEDURE TRAY) ×3 IMPLANT
PAD ARMBOARD 7.5X6 YLW CONV (MISCELLANEOUS) ×6 IMPLANT
PAD EYE OVAL STERILE LF (GAUZE/BANDAGES/DRESSINGS) IMPLANT
PATTIES SURGICAL .25X.25 (GAUZE/BANDAGES/DRESSINGS) IMPLANT
PATTIES SURGICAL .5 X.5 (GAUZE/BANDAGES/DRESSINGS) IMPLANT
PATTIES SURGICAL .5 X3 (DISPOSABLE) IMPLANT
PATTIES SURGICAL 1/4 X 3 (GAUZE/BANDAGES/DRESSINGS) IMPLANT
PATTIES SURGICAL 1X1 (DISPOSABLE) IMPLANT
PIN MAYFIELD SKULL DISP (PIN) ×3 IMPLANT
RUBBERBAND STERILE (MISCELLANEOUS) ×6 IMPLANT
SET TUBING W/EXT DISP (INSTRUMENTS) ×3 IMPLANT
SPECIMEN JAR SMALL (MISCELLANEOUS) IMPLANT
SPONGE GAUZE 4X4 12PLY (GAUZE/BANDAGES/DRESSINGS) IMPLANT
SPONGE NEURO XRAY DETECT 1X3 (DISPOSABLE) IMPLANT
SPONGE SURGIFOAM ABS GEL 100 (HEMOSTASIS) ×3 IMPLANT
STAPLER SKIN PROX WIDE 3.9 (STAPLE) ×3 IMPLANT
SUT ETHILON 3 0 FSL (SUTURE) ×3 IMPLANT
SUT NURALON 4 0 TR CR/8 (SUTURE) ×6 IMPLANT
SUT SILK 2 0 FS (SUTURE) IMPLANT
SUT VIC AB 0 CT1 18XCR BRD8 (SUTURE) ×1 IMPLANT
SUT VIC AB 0 CT1 8-18 (SUTURE) ×2
SUT VIC AB 2-0 CP2 18 (SUTURE) ×6 IMPLANT
SYR 20ML ECCENTRIC (SYRINGE) ×3 IMPLANT
SYR CONTROL 10ML LL (SYRINGE) ×3 IMPLANT
TIP SONASTAR STD MISONIX 1.9 (TRAY / TRAY PROCEDURE) IMPLANT
TIP STRAIGHT 25KHZ (INSTRUMENTS) ×3 IMPLANT
TOWEL OR 17X24 6PK STRL BLUE (TOWEL DISPOSABLE) ×3 IMPLANT
TOWEL OR 17X26 10 PK STRL BLUE (TOWEL DISPOSABLE) ×3 IMPLANT
TRAY FOLEY CATH 14FRSI W/METER (CATHETERS) IMPLANT
TRAY FOLEY CATH 16FRSI W/METER (SET/KITS/TRAYS/PACK) ×3 IMPLANT
TUBE CONNECTING 12'X1/4 (SUCTIONS) ×1
TUBE CONNECTING 12X1/4 (SUCTIONS) ×2 IMPLANT
UNDERPAD 30X30 INCONTINENT (UNDERPADS AND DIAPERS) ×3 IMPLANT
WATER STERILE IRR 1000ML POUR (IV SOLUTION) ×3 IMPLANT

## 2013-11-18 NOTE — Op Note (Signed)
11/18/2013  8:29 PM  PATIENT:  Cameron Lam  80 y.o. male  PRE-OPERATIVE DIAGNOSIS:  Brain tumor  POST-OPERATIVE DIAGNOSIS:  Brain tumor  PROCEDURE:  Procedure(s) with comments: CRANIOTOMY TUMOR EXCISION (N/A) - Craniotomy for tumor excision Suboccipital craniectomy with resection of posterior fossa tumor and microdissection  SURGEON:  Surgeon(s) and Role:    * Erline Levine, MD - Primary    * Winfield Cunas, MD - Assisting  PHYSICIAN ASSISTANT:   ASSISTANTS: none   ANESTHESIA:   general  EBL:  Total I/O In: 1800 [I.V.:1800] Out: 550 [Urine:500; Blood:50]  BLOOD ADMINISTERED:none  DRAINS: none   LOCAL MEDICATIONS USED:  LIDOCAINE   SPECIMEN:  Excision  DISPOSITION OF SPECIMEN:  PATHOLOGY  COUNTS:  YES  TOURNIQUET:  * No tourniquets in log *  DICTATION: DICTATION: Patient is 80 year old man with a large cerebellar metastasis.  He has undergone preoperative Stereotactic Radiosurgery.  It was elected to take patient to surgery for sub-occipital craniectomy for tumor.  Procedure:  Following smooth intubation, patient was placed in prone position on chest rolls with 3 pin head fixation. Occipital region was shaved and prepped and draped in usual sterile fashion with betadine scrub and Duraprep.  Area of planned incision was infiltrated with lidocaine. A linear incision was made in the suboccipital region to expose calvarium. High speed drill was used to perform bilateral suboccipital craniectomy to expose the dura.  Dura was opened over both cerebellar hemispheres.  A corticotomy was created in the leftt cerebellar hemisphere and carried to the large tumor.  This was dense, white, cystic and solid.  The tumor was carefully dissected and removed from the cerebellum with Sonapet Ultrasonic Aspirator and debulked, then mobilized and removed.  This was done with microdissection.   Hemostasis was assured with irrigation and cotton balls and Surgifoam.  Hemostasis was assured.  The  brain was considerably more relaxed after tumor resection.  The evacuation cavity was lined with Surgifoam. The dura was sutured and covered with Gelfoam and  the fascia was closed with 0 vicryl sutures, subcutaneous tissues were reapproximated with 2-0 vicryl sutures and the skin was re approximated with  A 3-0 Nylon sutures.  A sterile occlusive dressing was placed.  Patient was returned to a supine position and taken out of pins and extubated in the operating room and taken to Recovery having tolerated his surgery well.  Counts were correct at the end of the case.  PLAN OF CARE: Admit to inpatient   PATIENT DISPOSITION:  PACU - hemodynamically stable.   Delay start of Pharmacological VTE agent (>24hrs) due to surgical blood loss or risk of bleeding: yes

## 2013-11-18 NOTE — Progress Notes (Signed)
Awake, extubated, somnolent, but arousable.  Moving all extremities spontaneously.  Doing well.

## 2013-11-18 NOTE — Interval H&P Note (Signed)
History and Physical Interval Note:  11/18/2013 7:19 AM  Cameron Lam  has presented today for surgery, with the diagnosis of Brain tumor  The various methods of treatment have been discussed with the patient and family. After consideration of risks, benefits and other options for treatment, the patient has consented to  Procedure(s) with comments: CRANIOTOMY TUMOR EXCISION (N/A) - Craniotomy for tumor excision as a surgical intervention .  The patient's history has been reviewed, patient examined, no change in status, stable for surgery.  I have reviewed the patient's chart and labs.  Questions were answered to the patient's satisfaction.     Pria Klosinski D

## 2013-11-18 NOTE — Transfer of Care (Signed)
Immediate Anesthesia Transfer of Care Note  Patient: Cameron Lam  Procedure(s) Performed: Procedure(s) with comments: CRANIOTOMY TUMOR EXCISION (N/A) - Craniotomy for tumor excision  Patient Location: ICU  Anesthesia Type:General  Level of Consciousness: sedated  Airway & Oxygen Therapy: Patient Spontanous Breathing and Patient connected to face mask oxygen  Post-op Assessment: Report given to PACU RN and Post -op Vital signs reviewed and stable  Post vital signs: Reviewed and stable  Complications: No apparent anesthesia complications

## 2013-11-18 NOTE — Preoperative (Signed)
Beta Blockers   Reason not to administer Beta Blockers:Not Applicable 

## 2013-11-18 NOTE — Anesthesia Preprocedure Evaluation (Addendum)
Anesthesia Evaluation  Patient identified by MRN, date of birth, ID band Patient awake    Reviewed: Allergy & Precautions, H&P , NPO status , Patient's Chart, lab work & pertinent test results  Airway Mallampati: I TM Distance: >3 FB Neck ROM: Full    Dental  (+) Edentulous Upper and Dental Advisory Given Few bottom teeth poor bottom dentition:   Pulmonary COPDCurrent Smoker,  Lung ca with liver and brain mets + rhonchi   + wheezing      Cardiovascular hypertension, Rhythm:Regular Rate:Normal     Neuro/Psych  Headaches, Large cerebellar brain tumor    GI/Hepatic   Endo/Other    Renal/GU      Musculoskeletal   Abdominal   Peds  Hematology  (+) anemia ,   Anesthesia Other Findings   Reproductive/Obstetrics                         Anesthesia Physical Anesthesia Plan  ASA: IV  Anesthesia Plan: General   Post-op Pain Management:    Induction: Intravenous  Airway Management Planned: Oral ETT  Additional Equipment: Arterial line and CVP  Intra-op Plan:   Post-operative Plan: Possible Post-op intubation/ventilation  Informed Consent: I have reviewed the patients History and Physical, chart, labs and discussed the procedure including the risks, benefits and alternatives for the proposed anesthesia with the patient or authorized representative who has indicated his/her understanding and acceptance.     Plan Discussed with: CRNA and Surgeon  Anesthesia Plan Comments:         Anesthesia Quick Evaluation

## 2013-11-18 NOTE — Interval H&P Note (Signed)
History and Physical Interval Note:  11/18/2013 7:19 AM  Cameron Lam  has presented today for surgery, with the diagnosis of Brain tumor  The various methods of treatment have been discussed with the patient and family. After consideration of risks, benefits and other options for treatment, the patient has consented to  Procedure(s) with comments: CRANIOTOMY TUMOR EXCISION (N/A) - Craniotomy for tumor excision as a surgical intervention .  The patient's history has been reviewed, patient examined, no change in status, stable for surgery.  I have reviewed the patient's chart and labs.  Questions were answered to the patient's satisfaction.     Deng Kemler D

## 2013-11-18 NOTE — Progress Notes (Signed)
Report to [employer] as primary caregiver.

## 2013-11-18 NOTE — Anesthesia Postprocedure Evaluation (Signed)
  Anesthesia Post-op Note  Patient: Cameron Lam  Procedure(s) Performed: Procedure(s) with comments: CRANIOTOMY TUMOR EXCISION (N/A) - Craniotomy for tumor excision  Patient Location: PACU and NICU  Anesthesia Type:General  Level of Consciousness: awake  Airway and Oxygen Therapy: Patient Spontanous Breathing and Patient connected to face mask oxygen  Post-op Pain: mild  Post-op Assessment: Post-op Vital signs reviewed, Patient's Cardiovascular Status Stable, Respiratory Function Stable, Patent Airway, No signs of Nausea or vomiting and Pain level controlled  Post-op Vital Signs: Reviewed and stable  Complications: No apparent anesthesia complications

## 2013-11-18 NOTE — Anesthesia Procedure Notes (Signed)
Procedure Name: Intubation Date/Time: 11/18/2013 6:30 PM Performed by: Izora Gala Pre-anesthesia Checklist: Patient identified, Timeout performed, Emergency Drugs available, Suction available and Patient being monitored Patient Re-evaluated:Patient Re-evaluated prior to inductionOxygen Delivery Method: Circle system utilized Preoxygenation: Pre-oxygenation with 100% oxygen Intubation Type: IV induction Ventilation: Mask ventilation without difficulty Laryngoscope Size: Miller and 2 Grade View: Grade I Tube type: Oral Tube size: 7.5 mm Number of attempts: 1 Airway Equipment and Method: Stylet Placement Confirmation: breath sounds checked- equal and bilateral,  ETT inserted through vocal cords under direct vision and positive ETCO2 Secured at: 22 cm Tube secured with: Tape Dental Injury: Teeth and Oropharynx as per pre-operative assessment  Comments: IV induction Fredrick- intubation by Lynnea Ferrier CRNA atraumatic

## 2013-11-18 NOTE — Brief Op Note (Signed)
11/18/2013  8:29 PM  PATIENT:  Feliz Beam  80 y.o. male  PRE-OPERATIVE DIAGNOSIS:  Brain tumor  POST-OPERATIVE DIAGNOSIS:  Brain tumor  PROCEDURE:  Procedure(s) with comments: CRANIOTOMY TUMOR EXCISION (N/A) - Craniotomy for tumor excision Suboccipital craniectomy with resection of posterior fossa tumor and microdissection  SURGEON:  Surgeon(s) and Role:    * Erline Levine, MD - Primary    * Winfield Cunas, MD - Assisting  PHYSICIAN ASSISTANT:   ASSISTANTS: none   ANESTHESIA:   general  EBL:  Total I/O In: 1800 [I.V.:1800] Out: 550 [Urine:500; Blood:50]  BLOOD ADMINISTERED:none  DRAINS: none   LOCAL MEDICATIONS USED:  LIDOCAINE   SPECIMEN:  Excision  DISPOSITION OF SPECIMEN:  PATHOLOGY  COUNTS:  YES  TOURNIQUET:  * No tourniquets in log *  DICTATION: DICTATION: Patient is 80 year old man with a large cerebellar metastasis.  He has undergone preoperative Stereotactic Radiosurgery.  It was elected to take patient to surgery for sub-occipital craniectomy for tumor.  Procedure:  Following smooth intubation, patient was placed in prone position on chest rolls with 3 pin head fixation. Occipital region was shaved and prepped and draped in usual sterile fashion with betadine scrub and Duraprep.  Area of planned incision was infiltrated with lidocaine. A linear incision was made in the suboccipital region to expose calvarium. High speed drill was used to perform bilateral suboccipital craniectomy to expose the dura.  Dura was opened over both cerebellar hemispheres.  A corticotomy was created in the leftt cerebellar hemisphere and carried to the large tumor.  This was dense, white, cystic and solid.  The tumor was carefully dissected and removed from the cerebellum with Sonapet Ultrasonic Aspirator and debulked, then mobilized and removed.  This was done with microdissection.   Hemostasis was assured with irrigation and cotton balls and Surgifoam.  Hemostasis was assured.  The  brain was considerably more relaxed after tumor resection.  The evacuation cavity was lined with Surgifoam. The dura was sutured and covered with Gelfoam and  the fascia was closed with 0 vicryl sutures, subcutaneous tissues were reapproximated with 2-0 vicryl sutures and the skin was re approximated with  A 3-0 Nylon sutures.  A sterile occlusive dressing was placed.  Patient was returned to a supine position and taken out of pins and extubated in the operating room and taken to Recovery having tolerated his surgery well.  Counts were correct at the end of the case.  PLAN OF CARE: Admit to inpatient   PATIENT DISPOSITION:  PACU - hemodynamically stable.   Delay start of Pharmacological VTE agent (>24hrs) due to surgical blood loss or risk of bleeding: yes

## 2013-11-18 NOTE — Op Note (Signed)
  Name: Cameron Lam  MRN: [id number]  Date: 11/11/2013   DOB: 16-Jul-1934  Stereotactic Radiosurgery Operative Note  PRE-OPERATIVE DIAGNOSIS:  Solitary Brain Metastasis  POST-OPERATIVE DIAGNOSIS:  Solitary Brain Metastasis  PROCEDURE:  Stereotactic Radiosurgery  SURGEON:  Peggyann Shoals, MD  NARRATIVE: The patient underwent a radiation treatment planning session in the radiation oncology simulation suite under the care of the radiation oncology physician and physicist.  I participated closely in the radiation treatment planning afterwards. The patient underwent planning CT which was fused to 3T high resolution MRI with 1 mm axial slices.  These images were fused on the planning system.  Radiation oncology contoured the gross target volume and subsequently expanded this to yield the Planning Target Volume. I actively participated in the planning process.  I helped to define and review the target contours and also the contours of the optic pathway, eyes, brainstem and selected nearby organs at risk.  All the dose constraints for critical structures were reviewed and compared to AAPM Task Group 101.  The prescription dose conformity was reviewed.  I approved the plan electronically.    Accordingly, Cameron Lam was brought to the TrueBeam stereotactic radiation treatment linac and placed in the custom immobilization mask.  The patient was aligned according to the IR fiducial markers with BrainLab Exactrac, then orthogonal x-rays were used in ExacTrac with the 6DOF robotic table and the shifts were made to align the patient  This lesion was complex because it was 3.5 cm or greater, adjacent (within 20mm) of the optic nerve, optic chasm, optic tract, and/or within the brainstem is complex  Cameron Lam received stereotactic radiosurgery uneventfully.  The detailed description of the procedure is recorded in the radiation oncology procedure note.  I was present for the duration of the  procedure.  DISPOSITION:  Following delivery, the patient was transported to nursing in stable condition and monitored for possible acute effects to be discharged to home in stable condition with follow-up in one month.  Peggyann Shoals, MD 11/18/2013 1:52 PM

## 2013-11-19 ENCOUNTER — Encounter: Payer: Self-pay | Admitting: Internal Medicine

## 2013-11-19 LAB — MRSA PCR SCREENING: MRSA by PCR: NEGATIVE

## 2013-11-19 MED ORDER — MIDAZOLAM HCL 2 MG/2ML IJ SOLN: 1.0000 mg | INTRAMUSCULAR | Status: DC | PRN

## 2013-11-19 MED ORDER — FENTANYL CITRATE 0.05 MG/ML IJ SOLN: 50.0000 ug | Freq: Once | INTRAMUSCULAR | Status: DC

## 2013-11-19 NOTE — Progress Notes (Signed)
Faxed daughter's fmla form to [phone].

## 2013-11-19 NOTE — Progress Notes (Signed)
  Radiation Oncology         (336) [phone] ________________________________  Name: Cameron Lam MRN: [id number]  Date: 11/17/2013  DOB: 10-Sep-1934   SPECIAL TREATMENT PROCEDURE   3D TREATMENT PLANNING AND DOSIMETRY: The patient's radiation plan was reviewed and approved by Dr. Vertell Limber from neurosurgery and radiation oncology prior to treatment. It showed 3-dimensional radiation distributions overlaid onto the planning CT/MRI image set. The [facility] for the target structures as well as the organs at risk were reviewed. The documentation of the 3D plan and dosimetry are filed in the radiation oncology EMR.   NARRATIVE: The patient was brought to the TrueBeam stereotactic radiation treatment machine and placed supine on the CT couch. The head frame was applied, and the patient was set up for stereotactic radiosurgery. Neurosurgery was present for the set-up and delivery   SIMULATION VERIFICATION: In the couch zero-angle position, the patient underwent Exactrac imaging using the Brainlab system with orthogonal KV images. These were carefully aligned and repeated to confirm treatment position for each of the isocenters. The Exactrac snap film verification was repeated at each couch angle.   SPECIAL TREATMENT PROCEDURE: The patient received stereotactic radiosurgery to the following targets:  PTV1 L cerebellar 45 mm target was treated using 3 VMAT fields to a prescription dose of 14 Gy. ExacTrac Snap verification was performed for each couch angle.   STEREOTACTIC TREATMENT MANAGEMENT: Following delivery, the patient was transported to nursing in stable condition and monitored for possible acute effects. Vital signs were recorded . The patient tolerated treatment without significant acute effects, and was discharged to home in stable condition.  PLAN: Surgery tomorrow. The patient will follow-up in our clinic in 1 month.   ------------------------------------------------  Jodelle Gross, MD, PhD

## 2013-11-19 NOTE — Progress Notes (Signed)
  Radiation Oncology         (336) [phone] ________________________________  Name: Cameron Lam MRN: [id number]  Date: 11/13/2013  DOB: [birth year]  SIMULATION AND TREATMENT PLANNING NOTE  DIAGNOSIS:  Metastatic lung cancer with brain metastasis  NARRATIVE:  The patient was brought to the [facility] suite.  Identity was confirmed.  All relevant records and images related to the planned course of therapy were reviewed.  The patient freely provided informed written consent to proceed with treatment after reviewing the details related to the planned course of therapy. The consent form was witnessed and verified by the simulation staff. Intravenous access was established for contrast administration. Then, the patient was set-up in a stable reproducible supine position for radiation therapy.  A relocatable thermoplastic stereotactic head frame was fabricated for precise immobilization.  CT images were obtained.  Surface markings were placed.  The CT images were loaded into the planning software and fused with the patient's targeting MRI scan.  Then the target and avoidance structures were contoured.  Treatment planning then occurred.  The radiation prescription was entered and confirmed.  I have requested 3D planning  I have requested a DVH of the following structures: Brain stem, brain, left eye, right I, lenses, optic chiasm, target volumes, uninvolved brain, and normal tissue.    PLAN:  The patient will receive 14 Gy in 1 fraction.  ________________________________  Jodelle Gross, MD, PhD

## 2013-11-19 NOTE — Progress Notes (Signed)
UR completed.  Sevanna Ballengee, RN BSN MHA CCM Trauma/Neuro ICU Case Manager [phone]  

## 2013-11-19 NOTE — Progress Notes (Signed)
  Radiation Oncology         (336) [phone] ________________________________  Name: Cameron Lam MRN: [id number]  Date: 11/17/2013  DOB: [birth year]  End of Treatment Note  Diagnosis:   Metastatic non-small cell lung cancer     Indication for treatment:  Palliative       Radiation treatment dates:   11/17/2013  Site/dose:    PTV1 L cerebellar 45 mm target was treated using 3 VMAT fields to a prescription dose of 14 Gy. ExacTrac Snap verification was performed for each couch angle.   Narrative: The patient tolerated radiation treatment relatively well.   He did not exhibit any acute toxicity after treatment.  Plan: The patient has completed radiation treatment. The patient will receive for surgery tomorrow with Dr.  Vertell Limber and then he will return to radiation oncology clinic for routine followup in one month. I advised the patient to call or return sooner if they have any questions or concerns related to their recovery or treatment. ________________________________  Jodelle Gross, M.D., Ph.D.

## 2013-11-19 NOTE — Addendum Note (Signed)
Encounter addended by: Marye Round, MD on: 11/19/2013 10:50 AM<BR>     Documentation filed: Notes Section

## 2013-11-19 NOTE — Progress Notes (Signed)
Subjective: Patient reports doing well  Objective: Vital signs in last 24 hours: Temp:  [97 F (36.1 C)-98 F (36.7 C)] 98 F (36.7 C) (01/28 0325) Pulse Rate:  [60-91] 89 (01/28 0443) Resp:  [12-28] 16 (01/28 0443) BP: (140-171)/(78-97) 148/94 mmHg (01/28 0400) SpO2:  [90 %-100 %] 96 % (01/28 0443) Arterial Line BP: (137-168)/(60-85) 162/66 mmHg (01/28 0443) Weight:  [61.9 kg (136 lb 7.4 oz)] 61.9 kg (136 lb 7.4 oz) (01/27 2200)  Intake/Output from previous day: 01/27 0701 - 01/28 0700 In: 2146.3 [I.V.:1991.3; IV Piggyback:155] Out: 1350 [Urine:1300; Blood:50] Intake/Output this shift: Total I/O In: 2146.3 [I.V.:1991.3; IV Piggyback:155] Out: 1350 [Urine:1300; Blood:50]  Physical Exam: Dressing CDI.  Up to chair  Lab Results:  Recent Labs  11/17/13 1554  WBC 35.8*  HGB 10.3*  HCT 33.5*  PLT 304   BMET  Recent Labs  11/17/13 1554  NA 133*  K 4.5  CL 96  CO2 21  GLUCOSE 169*  BUN 16  CREATININE 0.68  CALCIUM 8.6    Studies/Results: X-ray Chest Pa Or Ap  11/18/2013   CLINICAL DATA:  Right subclavian central line insertion  EXAM: CHEST - 1 VIEW  COMPARISON:  11/17/2013  FINDINGS: New right subclavian central line tip at the upper SVC level. Background COPD/ emphysema with hyperinflation. Increased basilar opacities with chronic appearing background interstitial fibrosis. Suspect worsening basilar atelectasis. No edema. Negative for pneumothorax or effusion. Heart is enlarged. Trachea is midline.  IMPRESSION: New right IJ central line tip upper SVC.  No pneumothorax.  COPD/emphysema with basilar fibrosis  Increased basilar atelectasis   Electronically Signed   By: Daryll Brod M.D.   On: 11/18/2013 21:35   Dg Chest 2 View  11/17/2013   CLINICAL DATA:  Lung carcinoma  EXAM: CHEST - 2 VIEW  COMPARISON:  04/14/2013  FINDINGS: Lungs are hyperinflated with attenuated upper lobe bronchovascular markings. Left perihilar scarring without discrete mass or consolidation.  Linear subsegmental atelectasis or scarring in both lower lobes, left greater than right. Heart size upper limits normal. No effusion. Regional bones unremarkable. Marland Kitchen  IMPRESSION: 1. Hyperinflation with coarse left lower lung interstitial opacities, no focal consolidation or mass.   Electronically Signed   By: Arne Cleveland M.D.   On: 11/17/2013 17:18    Assessment/Plan: Mobilize.  Doing well POD #1 posterior fossa craniotomy and tumor resection.  Will need to work with PT.  Will obtain postop MRI.  D/C A line.    LOS: 1 day    Peggyann Shoals, MD 11/19/2013, 6:54 AM

## 2013-11-20 ENCOUNTER — Encounter (HOSPITAL_COMMUNITY): Payer: Self-pay | Admitting: Neurosurgery

## 2013-11-20 ENCOUNTER — Other Ambulatory Visit: Payer: Medicare Other

## 2013-11-20 ENCOUNTER — Ambulatory Visit: Payer: Medicare Other

## 2013-11-20 ENCOUNTER — Ambulatory Visit: Payer: Medicare Other | Admitting: Internal Medicine

## 2013-11-20 ENCOUNTER — Inpatient Hospital Stay (HOSPITAL_COMMUNITY): Payer: Medicare Other

## 2013-11-20 MED ORDER — METHOCARBAMOL 500 MG PO TABS
500.0000 mg | ORAL_TABLET | Freq: Three times a day (TID) | ORAL | Status: DC | PRN
  Administered 2013-11-20 (×2): 500 mg via ORAL
  Filled 2013-11-20 (×2): qty 1

## 2013-11-20 MED ORDER — OXYCODONE-ACETAMINOPHEN 5-325 MG PO TABS
1.0000 | ORAL_TABLET | ORAL | Status: DC | PRN
  Administered 2013-11-20 – 2013-11-21 (×4): 2 via ORAL
  Administered 2013-11-23 – 2013-11-25 (×3): 1 via ORAL
  Filled 2013-11-20: qty 2
  Filled 2013-11-20: qty 1
  Filled 2013-11-20: qty 2
  Filled 2013-11-20: qty 1
  Filled 2013-11-20: qty 2
  Filled 2013-11-20: qty 1
  Filled 2013-11-20: qty 2

## 2013-11-20 MED ORDER — GADOBENATE DIMEGLUMINE 529 MG/ML IV SOLN
12.0000 mL | Freq: Once | INTRAVENOUS | Status: AC | PRN
  Administered 2013-11-20: 12 mL via INTRAVENOUS

## 2013-11-20 MED ORDER — DIAZEPAM 5 MG PO TABS: 5.0000 mg | ORAL_TABLET | Freq: Four times a day (QID) | ORAL | Status: DC | PRN

## 2013-11-20 NOTE — Evaluation (Signed)
Physical Therapy Evaluation Patient Details Name: Cameron Lam MRN: [id number] DOB: May 27, 1934 Today's Date: 11/20/2013 Time: 6433-2951 PT Time Calculation (min): 18 min  PT Assessment / Plan / Recommendation History of Present Illness  Cameron Lam, 80y.o. male, visits in follow up of brain MRI.  Lung CA dx June 2014, Hepatic & Spenic lesions discovered with f/u imaging late 2014, and brain mass Jan 2015 after pt c/o headaches & balance issues in Dec.  Chemo since June 2014. Pt is s/p craniotomy for tumor resection.   Clinical Impression  Pt is s/p  Craniotomy tumor excision. Pt presents with limitations in independence with functional mobility secondary to deficits indicated below. Pt is a fall risk at this time secondary to cognitive and balance deficits. Pt will benefit from skilled acute PT to address deficits indicated below and increase independence with mobility prior to returning home with wife. Pt may benefit from ambulating with AD for a period of time to reduce risk of falls.   PT Assessment  Patient needs continued PT services    Follow Up Recommendations  No PT follow up;Supervision/Assistance - 24 hour    Does the patient have the potential to tolerate intense rehabilitation      Barriers to Discharge        Equipment Recommendations  Other (comment) (TBD; least restrictive device)    Recommendations for Other Services OT consult   Frequency Min 3X/week    Precautions / Restrictions Precautions Precautions: Fall Restrictions Weight Bearing Restrictions: No   Pertinent Vitals/Pain C/o "head being sore"; turned light off and pt asleep before therapist was out of the room. RN notified.       Mobility  Bed Mobility General bed mobility comments: not assessed; pt in recliner and returned to recliner  Transfers Overall transfer level: Needs assistance Equipment used: 1 person hand held assist Transfers: Sit to/from Stand Sit to Stand: Min assist General  transfer comment: initial transfer pt demo good stability; 2nd sit to stand pt became off balance to Lt and required (A) to maintain upright position; cues for hand placement and sequencing  Ambulation/Gait Ambulation/Gait assistance: Mod assist Ambulation Distance (Feet): 8 Feet Assistive device: 1 person hand held assist Gait Pattern/deviations: Decreased stride length;Narrow base of support;Drifts right/left Gait velocity: decreased Gait velocity interpretation: <1.8 ft/sec, indicative of risk for recurrent falls General Gait Details: pt drifting Rt and Lt; unsteady with ambulation; required UE support to maintain balance; suspect due to lethargic state; pt vitals stable and did not complain of dizziness; pt seemed unaware of drifting during ambulation; will plan to attempt ambulation with AD upon next visit          PT Diagnosis: Abnormality of gait;Generalized weakness;Acute pain  PT Problem List: Decreased activity tolerance;Decreased balance;Decreased mobility;Decreased cognition;Decreased knowledge of use of DME;Decreased safety awareness;Pain PT Treatment Interventions: DME instruction;Gait training;Stair training;Functional mobility training;Therapeutic activities;Therapeutic exercise;Balance training;Neuromuscular re-education;Patient/family education     PT Goals(Current goals can be found in the care plan section) Acute Rehab PT Goals Patient Stated Goal: to go home tomorrow PT Goal Formulation: With patient Time For Goal Achievement: 12/04/13 Potential to Achieve Goals: Good  Visit Information  Last PT Received On: 11/20/13 Assistance Needed: +1 History of Present Illness: Cameron Lam, [id number].o. male, visits in follow up of brain MRI.  Lung CA dx June 2014, Hepatic & Spenic lesions discovered with f/u imaging late 2014, and brain mass Jan 2015 after pt c/o headaches & balance issues in Dec.  Chemo since June 2014. Pt  is s/p craniotomy for tumor resection.        Prior  [place] expects to be discharged to:: Private residence Living Arrangements: Spouse/significant other Available Help at Discharge: Family;Available 24 hours/day Type of Home: House Home Access: Stairs to enter [employer] of Steps: 5-7 Entrance Stairs-Rails: Can reach both Home Layout: Two level;Bed/bath upstairs Alternate Level Stairs-Number of Steps: 9-12 Alternate Level Stairs-Rails: Right Home Equipment: Walker - 2 wheels;Cane - single point;Grab bars - tub/shower Prior Function Level of Independence: Independent Communication Communication: No difficulties Dominant Hand: Right    Cognition  Cognition Arousal/Alertness: Lethargic;Suspect due to medications Behavior During Therapy: [facility] for tasks assessed/performed Overall Cognitive Status: Impaired/Different from baseline Area of Impairment: Problem solving;Attention;Safety/judgement Current Attention Level: Sustained Safety/Judgement: Decreased awareness of deficits;Decreased awareness of safety Problem Solving: Slow processing;Difficulty sequencing;Requires verbal cues;Requires tactile cues General Comments: suspect cognition deficits due to lethargic state; pt arousable during session but requires multimodal cues and stimuli     Extremity/Trunk Assessment Upper Extremity Assessment Upper Extremity Assessment: Defer to OT evaluation Lower Extremity Assessment Lower Extremity Assessment: Overall WFL for tasks assessed Cervical / Trunk Assessment Cervical / Trunk Assessment: Normal   Balance Balance Overall balance assessment: Needs assistance Standing balance support: During functional activity;Single extremity supported Standing balance-Leahy Scale: Poor Standing balance comment: unsteady and requires UE support at all times   End of Session PT - End of Session Equipment Utilized During Treatment: Gait belt;Oxygen (5L O2) Activity Tolerance: Patient limited by pain;Patient  limited by lethargy Patient left: in chair;with call bell/phone within reach Nurse Communication: Mobility status;Precautions  GP     Cameron Lam, [place] [phone] 11/20/2013, 3:14 PM

## 2013-11-20 NOTE — Progress Notes (Signed)
Wasted 1 mg morphine in sink. Witnessed by Arcola Jansky, RN

## 2013-11-20 NOTE — Progress Notes (Signed)
Subjective: Patient reports doing well, but still feeling weak  Objective: Vital signs in last 24 hours: Temp:  [98.3 F (36.8 C)-99.6 F (37.6 C)] 99.2 F (37.3 C) (01/29 0420) Pulse Rate:  [80-101] 92 (01/29 0600) Resp:  [13-31] 13 (01/29 0600) BP: (125-176)/(68-94) 136/83 mmHg (01/29 0600) SpO2:  [93 %-100 %] 96 % (01/29 0600) Arterial Line BP: (67-166)/(48-93) 166/93 mmHg (01/28 1800)  Intake/Output from previous day: 01/28 0701 - 01/29 0700 In: 1980 [I.V.:1725; IV Piggyback:255] Out: 7253 [Urine:1515] Intake/Output this shift:    Physical Exam: Dressing CDI.  Speech fluent, good coordination.  MAEW  Lab Results:  Recent Labs  11/17/13 1554  WBC 35.8*  HGB 10.3*  HCT 33.5*  PLT 304   BMET  Recent Labs  11/17/13 1554  NA 133*  K 4.5  CL 96  CO2 21  GLUCOSE 169*  BUN 16  CREATININE 0.68  CALCIUM 8.6    Studies/Results: X-ray Chest Pa Or Ap  11/18/2013   CLINICAL DATA:  Right subclavian central line insertion  EXAM: CHEST - 1 VIEW  COMPARISON:  11/17/2013  FINDINGS: New right subclavian central line tip at the upper SVC level. Background COPD/ emphysema with hyperinflation. Increased basilar opacities with chronic appearing background interstitial fibrosis. Suspect worsening basilar atelectasis. No edema. Negative for pneumothorax or effusion. Heart is enlarged. Trachea is midline.  IMPRESSION: New right IJ central line tip upper SVC.  No pneumothorax.  COPD/emphysema with basilar fibrosis  Increased basilar atelectasis   Electronically Signed   By: Daryll Brod M.D.   On: 11/18/2013 21:35    Assessment/Plan: Continuing to improve.  Post operative MRI shows gross total resection of cerebellar metastasis.  Continue to mobilize with PT.    LOS: 2 days    Peggyann Shoals, MD 11/20/2013, 7:41 AM

## 2013-11-20 NOTE — Progress Notes (Signed)
Subjective: Patient reports "i'm ready to go home"  Objective: Vital signs in last 24 hours: Temp:  [98.5 F (36.9 C)-99.6 F (37.6 C)] 98.9 F (37.2 C) (01/29 1200) Pulse Rate:  [89-101] 94 (01/29 1200) Resp:  [13-27] 18 (01/29 1200) BP: (125-176)/(68-94) 139/84 mmHg (01/29 1200) SpO2:  [87 %-100 %] 87 % (01/29 1200) Arterial Line BP: (74-166)/(48-93) 166/93 mmHg (01/28 1800)  Intake/Output from previous day: 01/28 0701 - 01/29 0700 In: 2055 [I.V.:1800; IV Piggyback:255] Out: 3474 [Urine:1715] Intake/Output this shift: Total I/O In: 720 [P.O.:240; I.V.:375; IV Piggyback:105] Out: 275 [Urine:275]  Sitting in chair, Sats improved from earlier. Conversant, w/o c/o pain. Drsg intact, incision without erythema, swelling, or drainage. MAEW.  Lab Results:  Recent Labs  11/17/13 1554  WBC 35.8*  HGB 10.3*  HCT 33.5*  PLT 304   BMET  Recent Labs  11/17/13 1554  NA 133*  K 4.5  CL 96  CO2 21  GLUCOSE 169*  BUN 16  CREATININE 0.68  CALCIUM 8.6    Studies/Results: X-ray Chest Pa Or Ap  11/18/2013   CLINICAL DATA:  Right subclavian central line insertion  EXAM: CHEST - 1 VIEW  COMPARISON:  11/17/2013  FINDINGS: New right subclavian central line tip at the upper SVC level. Background COPD/ emphysema with hyperinflation. Increased basilar opacities with chronic appearing background interstitial fibrosis. Suspect worsening basilar atelectasis. No edema. Negative for pneumothorax or effusion. Heart is enlarged. Trachea is midline.  IMPRESSION: New right IJ central line tip upper SVC.  No pneumothorax.  COPD/emphysema with basilar fibrosis  Increased basilar atelectasis   Electronically Signed   By: Daryll Brod M.D.   On: 11/18/2013 21:35    Assessment/Plan: Improving   LOS: 2 days  Mobilize with PT as tolerated, advance diet as tolerated. MRI reviewed by DrStern: good tumor resection.   Verdis Prime 11/20/2013, 1:00 PM

## 2013-11-21 ENCOUNTER — Inpatient Hospital Stay (HOSPITAL_COMMUNITY): Payer: Medicare Other

## 2013-11-21 DIAGNOSIS — J441 Chronic obstructive pulmonary disease with (acute) exacerbation: Secondary | ICD-10-CM | POA: Diagnosis present

## 2013-11-21 DIAGNOSIS — J449 Chronic obstructive pulmonary disease, unspecified: Secondary | ICD-10-CM

## 2013-11-21 DIAGNOSIS — J690 Pneumonitis due to inhalation of food and vomit: Secondary | ICD-10-CM | POA: Diagnosis present

## 2013-11-21 DIAGNOSIS — R0902 Hypoxemia: Secondary | ICD-10-CM | POA: Diagnosis present

## 2013-11-21 LAB — CBC
HEMATOCRIT: 31 % — AB (ref 39.0–52.0)
Hemoglobin: 9.7 g/dL — ABNORMAL LOW (ref 13.0–17.0)
MCH: 22.8 pg — ABNORMAL LOW (ref 26.0–34.0)
MCHC: 31.3 g/dL (ref 30.0–36.0)
MCV: 72.9 fL — ABNORMAL LOW (ref 78.0–100.0)
PLATELETS: 255 10*3/uL (ref 150–400)
RBC: 4.25 MIL/uL (ref 4.22–5.81)
RDW: 20 % — ABNORMAL HIGH (ref 11.5–15.5)
WBC: 47.2 10*3/uL — AB (ref 4.0–10.5)

## 2013-11-21 LAB — BASIC METABOLIC PANEL
BUN: 19 mg/dL (ref 6–23)
CALCIUM: 8.1 mg/dL — AB (ref 8.4–10.5)
CHLORIDE: 92 meq/L — AB (ref 96–112)
CO2: 25 meq/L (ref 19–32)
Creatinine, Ser: 0.59 mg/dL (ref 0.50–1.35)
GFR calc Af Amer: >90 mL/min (ref >90)
GFR calc non Af Amer: >90 mL/min (ref >90)
Glucose, Bld: 143 mg/dL — ABNORMAL HIGH (ref 70–99)
POTASSIUM: 5.5 meq/L — AB (ref 3.7–5.3)
Sodium: 127 mEq/L — ABNORMAL LOW (ref 137–147)

## 2013-11-21 LAB — URINALYSIS, ROUTINE W REFLEX MICROSCOPIC
Bilirubin Urine: NEGATIVE
GLUCOSE, UA: NEGATIVE mg/dL
Hgb urine dipstick: NEGATIVE
Ketones, ur: NEGATIVE mg/dL
LEUKOCYTES UA: NEGATIVE
Nitrite: NEGATIVE
PROTEIN: NEGATIVE mg/dL
SPECIFIC GRAVITY, URINE: 1.009 (ref 1.005–1.030)
UROBILINOGEN UA: 0.2 mg/dL (ref 0.0–1.0)
pH: 6 (ref 5.0–8.0)

## 2013-11-21 LAB — PHOSPHORUS: Phosphorus: 1.9 mg/dL — ABNORMAL LOW (ref 2.3–4.6)

## 2013-11-21 LAB — POCT I-STAT 3, ART BLOOD GAS (G3+)
Acid-Base Excess: 2 mmol/L (ref 0.0–2.0)
Bicarbonate: 26.1 mEq/L — ABNORMAL HIGH (ref 20.0–24.0)
O2 Saturation: 89 %
TCO2: 27 mmol/L (ref 0–100)
pCO2 arterial: 37.8 mmHg (ref 35.0–45.0)
pH, Arterial: 7.448 (ref 7.350–7.450)
pO2, Arterial: 55 mmHg — ABNORMAL LOW (ref 80.0–100.0)

## 2013-11-21 LAB — MAGNESIUM: Magnesium: 1.9 mg/dL (ref 1.5–2.5)

## 2013-11-21 LAB — PROCALCITONIN: PROCALCITONIN: 0.44 ng/mL

## 2013-11-21 MED ORDER — PIPERACILLIN-TAZOBACTAM 3.375 G IVPB
3.3750 g | Freq: Three times a day (TID) | INTRAVENOUS | Status: DC
  Administered 2013-11-21 – 2013-11-23 (×5): 3.375 g via INTRAVENOUS
  Filled 2013-11-21 (×7): qty 50

## 2013-11-21 MED ORDER — ALBUTEROL SULFATE (2.5 MG/3ML) 0.083% IN NEBU
2.5000 mg | INHALATION_SOLUTION | RESPIRATORY_TRACT | Status: DC | PRN
  Administered 2013-11-21 (×2): 2.5 mg via RESPIRATORY_TRACT
  Filled 2013-11-21 (×2): qty 3

## 2013-11-21 MED ORDER — PIPERACILLIN-TAZOBACTAM 3.375 G IVPB 30 MIN
3.3750 g | INTRAVENOUS | Status: AC
  Administered 2013-11-21: 3.375 g via INTRAVENOUS
  Filled 2013-11-21: qty 50

## 2013-11-21 MED ORDER — FUROSEMIDE 10 MG/ML IJ SOLN
40.0000 mg | Freq: Three times a day (TID) | INTRAMUSCULAR | Status: AC
  Administered 2013-11-21 (×2): 40 mg via INTRAVENOUS
  Filled 2013-11-21 (×2): qty 4

## 2013-11-21 NOTE — Consult Note (Signed)
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 Admitted on 1/26 for crani for tumor excision by dr Vertell Limber. PCCM asked to assess on 1/30 for persistent hypoxia.   SIGNIFICANT EVENTS / STUDIES:  1/26: CRANIOTOMY TUMOR EXCISION (N/A) - Craniotomy for tumor excision Suboccipital craniectomy with resection of posterior fossa tumor and microdissection 1/29 MR brain: . Interval gross total resection of left cerebellar cystic and enhancing mass. Improved patency of the fourth ventricle and mildly decreased cerebellar edema. 2. Small volume pneumocephalus. Postoperative blood products with associated susceptibility artifact. Artifact versus small volume  infarct along the posterior inferior resection cavity. 3. Small suboccipital postoperative fluid collection. 1/30 swallow eval >>>  LINES / TUBES: Right IJ CVL 1/26>>>  CULTURES: bc 1/30>>> UC 1/30>>>  ANTIBIOTICS: Zosyn 1/30>>>  HISTORY OF PRESENT ILLNESS:   See above  PAST MEDICAL HISTORY :  Past Medical History  Diagnosis Date  . Shingles outbreak 06/16/10    Lt anterior and postior thigh/buttocks  . Hypertension   . Tobacco abuse   . Lung cancer   . Hx of radiation therapy 6/26 - 05/07/13    LLL lung 37.5 gray in 15 fractions  . COPD (chronic obstructive pulmonary disease)   . Anemia    Past Surgical History  Procedure Laterality Date  . Video bronchoscopy Bilateral 04/10/2013    Procedure: VIDEO BRONCHOSCOPY WITH FLUORO;  Surgeon:  Wilhelmina Mcardle, MD;  Location: [facility];  Service: Cardiopulmonary;  Laterality: Bilateral;  . Appendectomy    . Craniotomy N/A 11/18/2013    Procedure: CRANIOTOMY TUMOR EXCISION;  Surgeon: Erline Levine, MD;  Location: [facility];  Service: Neurosurgery;  Laterality: N/A;  Craniotomy for tumor excision   Prior to Admission medications   Medication Sig Start Date End Date Taking? Authorizing Provider  albuterol (PROVENTIL HFA;VENTOLIN HFA) 108 (90 BASE) MCG/ACT inhaler Inhale 2 puffs into the lungs every 6 (six) hours as needed for wheezing. 07/24/13  Yes Adrena E Johnson, PA-C  amLODipine (NORVASC) 10 MG tablet Take 1 tablet (10 mg total) by mouth daily. 09/17/13  Yes Robyn Haber, MD  aspirin 81 MG tablet Take 1 tablet (81 mg total) by mouth daily. 12/11/11  Yes Hayden Rasmussen, MD  dexamethasone (DECADRON) 4 MG tablet Take 1 tablet (4 mg total) by mouth 4 (four) times daily. 11/08/13  Yes Curt Bears, MD  feeding supplement (ENSURE COMPLETE) LIQD Take 237 mLs by mouth 2 (two) times daily between meals. 04/14/13  Yes Nishant Dhungel, MD  ferrous sulfate 325 (65 FE) MG tablet Take 325 mg by mouth 2 (two) times daily with a meal. 04/14/13  Yes Nishant Dhungel, MD  Multiple Vitamin (MULTIVITAMIN) tablet Take 1 tablet by mouth daily.   Yes Historical Provider, MD  prochlorperazine (COMPAZINE) 10 MG tablet Take 10 mg by mouth every 6 (six) hours as needed for nausea or vomiting.   Yes Historical Provider, MD   No Known Allergies  FAMILY  HISTORY:  No family history on file. SOCIAL HISTORY:  reports that he has been smoking Cigarettes.  He has a 15 pack-year smoking history. He has never used smokeless tobacco. He reports that he does not drink alcohol or use illicit drugs.  Review of Systems:   Bolds are positive  Constitutional: weight loss, gain, night sweats, Fevers, chills, fatigue .  HEENT: headaches, Sore throat, sneezing, nasal congestion, post nasal drip, Difficulty swallowing,  Tooth/dental problems, visual complaints visual changes, ear ache CV:  chest pain, radiates: ,Orthopnea, PND, swelling in lower extremities, dizziness, palpitations, syncope.  GI  heartburn, indigestion, abdominal pain, nausea, vomiting, diarrhea, change in bowel habits, loss of appetite, bloody stools.  Resp: cough, productive: , hemoptysis, dyspnea, chest pain, pleuritic.  Skin: rash or itching or icterus GU: dysuria, change in color of urine, urgency or frequency. flank pain, hematuria  MS: joint pain or swelling. decreased range of motion  Psych: change in mood or affect. depression or anxiety.  Neuro: difficulty with speech, weakness, numbness, ataxia     SUBJECTIVE:  No acute complaints  VITAL SIGNS: Temp:  [97 F (36.1 C)-97.9 F (36.6 C)] 97.6 F (36.4 C) (01/30 0302) Pulse Rate:  [89-117] 101 (01/30 1100) Resp:  [12-28] 18 (01/30 1100) BP: (109-162)/(67-130) 137/89 mmHg (01/30 1100) SpO2:  [85 %-100 %] 99 % (01/30 1100) FiO2 (%):  [55 %] 55 % (01/30 0906) HEMODYNAMICS:   VENTILATOR SETTINGS: Vent Mode:  [-]  FiO2 (%):  [55 %] 55 % INTAKE / OUTPUT: Intake/Output     01/29 0701 - 01/30 0700 01/30 0701 - 01/31 0700   P.O. 960 300   I.V. (mL/kg) 1800 (29.1) 300 (4.8)   IV Piggyback 205 105   Total Intake(mL/kg) 2965 (47.9) 705 (11.4)   Urine (mL/kg/hr) 2165 (1.5) 280 (0.7)   Total Output 2165 280   Net +800 +425          PHYSICAL EXAMINATION: General:  Sleepy 80 year old AAM, oriented, but in no acute distress.  Neuro:  Awake, oriented. C/o head ache  HEENT:  Posterior dressing CD&I. No JVD  Cardiovascular:  rrr Lungs:  Bilateral coarse rhonchi no accessory muscle use  Abdomen:  Non-tender + bowel sounds  Musculoskeletal:  Intact  Skin:  Intact   LABS:  CBC  Recent Labs Lab 11/17/13 1554  WBC 35.8*  HGB 10.3*  HCT 33.5*  PLT 304   Coag's No results found for this basename: APTT, INR,  in the last 168 hours BMET  Recent Labs Lab 11/17/13 1554    NA 133*  K 4.5  CL 96  CO2 21  BUN 16  CREATININE 0.68  GLUCOSE 169*   Electrolytes  Recent Labs Lab 11/17/13 1554  CALCIUM 8.6   Sepsis Markers No results found for this basename: LATICACIDVEN, PROCALCITON, O2SATVEN,  in the last 168 hours ABG No results found for this basename: PHART, PCO2ART, PO2ART,  in the last 168 hours Liver Enzymes No results found for this basename: AST, ALT, ALKPHOS, BILITOT, ALBUMIN,  in the last 168 hours Cardiac Enzymes No results found for this basename: TROPONINI, PROBNP,  in the last 168 hours Glucose No results found for this basename: GLUCAP,  in the last 168 hours  Imaging Mr Jeri Cos Wo Contrast  11/20/2013   CLINICAL DATA:  80 year old male status post craniotomy for tumor removal. Cerebellar tumor. History of lung cancer. Initial encounter.  This study was identified as missing a report at 1737 hr on 11/20/2013.  EXAM: MRI HEAD WITHOUT AND WITH CONTRAST  TECHNIQUE: Multiplanar, multiecho pulse sequences of the brain and surrounding structures were obtained without and with intravenous contrast.  CONTRAST:  93mL MULTIHANCE GADOBENATE DIMEGLUMINE 529 MG/ML IV SOLN  COMPARISON:  Preoperative MRI 11/12/2013 and earlier.  FINDINGS: Sequelae of occipital craniotomy. Small volume pneumocephalus and pneumo ventricle. Small volume suboccipital subcutaneous fluid collection, up to 4 cm in length by 1.5 by 2 cm.  Resection cavity with fluid in postoperative blood products. Subsequent susceptibility artifact on diffusion-weighted imaging. There might be a small volume of restricted diffusion along the posterior inferior resection site (series 5, image 9 and series 500, image 9). Following contrast, no definite residual abnormal enhancement (series 11), with mild vascular related enhancement suspected along the margin with the fourth ventricle. Left cerebellar edema has mildly decreased, along with mass effect on the fourth ventricle.  No ventriculomegaly.  Overall stable basilar cisterns. Major intracranial vascular flow voids are stable.  No new abnormal enhancement or new metastatic lesion identified. Stable to decreased curvilinear right parietal cortex enhancement, favor vascular related.  No other restricted diffusion. Incidental chronic right inferior cerebellar infarct again noted. Chronic appearing linear and cruciform T2 hyperintensity in the pons is stable. Supratentorial gray and white matter signal is stable.  Pituitary and visualized cervical spine remain within normal limits. Bone marrow signal is stable. Stable and negative orbits soft tissues. Stable paranasal sinuses. New trace mastoid fluid on the left. Small volume retained secretions in the nasopharynx.  IMPRESSION: 1. Interval gross total resection of left cerebellar cystic and enhancing mass. Improved patency of the fourth ventricle and mildly decreased cerebellar edema. 2. Small volume pneumocephalus. Postoperative blood products with associated susceptibility artifact. Artifact versus small volume infarct along the posterior inferior resection cavity. 3. Small suboccipital postoperative fluid collection.   Electronically Signed   By: Lars Pinks M.D.   On: 11/20/2013 18:12   Dg Chest Port 1 View  11/21/2013   CLINICAL DATA:  Shortness of breath.  EXAM: PORTABLE CHEST - 1 VIEW  COMPARISON:  11/18/2013 and 11/17/2013  FINDINGS: The patient has developed prominent bibasilar pulmonary infiltrates with increased atelectasis. Right central line tip is in the superior vena cava, slightly retracted since the prior study. Heart size and vascularity are normal. No acute osseous abnormality.  IMPRESSION: New prominent bibasilar pulmonary infiltrates.   Electronically Signed   By: Rozetta Nunnery M.D.   On: 11/21/2013 13:14     CXR: significant increasein bibasilar infiltrates. Think significant component of atx, but can't exclude element of edema/PNA   ASSESSMENT / PLAN:  PULMONARY A: acute  respiratory failure due to progressive atelectasis +/- PNA. He does endorse coughing w/ pos. Certainly element of aspiration is a concern in this setting as well. Also need to r/o element of edema  P:   Wean fio2 pulm hygiene SLP eval for dysphagia  Decrease IVFs Transduce CVP Get BNP Lasix X 2 F/u am CXR See ID section   CARDIOVASCULAR A: HTN  P:  Lasix Tele  Consider adding back norvasc following clinical response. Will cont PRN labetolol for now  RENAL A:  Mild hyponatremia on admit P:   Ck chemistry address electrolyte abnormalities F/u chem w/ lasix   GASTROINTESTINAL A:   Possible dysphagia  P:   NPO except meds  SLP eval   HEMATOLOGIC A:  NSCLC (squamous cell) w/ mets to liver and brain       Chronic anemia       Leukocytosis: possible  r/t steroids  P:  PAS to LEs Trend CBC   INFECTIOUS A:  Possible aspiration PNA  P:   Cycle PCTs Empiric abx Swallow eval   ENDOCRINE A:  Hyperglycemia  P:   Check CBGs q 4 Consider SSI if remains >150   NEUROLOGIC A: s/p crani for Metastatic squamous cell left cerebellar tumor   P:   Decadron per neuro Post-op care as directed by neuro  TODAY'S SUMMARY: His persistent hypoxia is explained by his progressive bibasilar atelectasis. This is not a directly a result of his lung cancer and should be reversible if atx alone. Concerned that he may be aspirating. Will ask SLP to eval and start empiric abx. Need to continue post-op pulm care.  Hold in ICU as patient is high risk for respiratory failure.  I have personally obtained a history, examined the patient, evaluated laboratory and imaging results, formulated the assessment and plan and placed orders.  CRITICAL CARE: The patient is critically ill with multiple organ systems failure and requires high complexity decision making for assessment and support, frequent evaluation and titration of therapies, application of advanced monitoring technologies and extensive  interpretation of multiple databases. Critical Care Time devoted to patient care services described in this note is 35 minutes.   Rush Farmer, M.D. Pulmonary and [place] Pager: [phone]  11/21/2013, 1:47 PM

## 2013-11-21 NOTE — Progress Notes (Signed)
Physical Therapy Treatment Patient Details Name: Cameron Lam MRN: [id number] DOB: [birth year] Today's Date: 11/21/2013 Time: 7824-2353 PT Time Calculation (min): 19 min  PT Assessment / Plan / Recommendation  History of Present Illness Cameron Lam, 80y.o. male, visits in follow up of brain MRI.  Lung CA dx June 2014, Hepatic & Spenic lesions discovered with f/u imaging late 2014, and brain mass Jan 2015 after pt c/o headaches & balance issues in Dec.  Chemo since June 2014. Pt is s/p craniotomy for tumor resection.    PT Comments   Pt able to ambulate within room today. Limited due to decreased due to fatigue and pain. Pt amb on 15L O2 and desat to 88%. Cues for pursed lip breathing and energy conservation given. Pt more steady with RW but still tends to become off balance and requires (A) to manage RW. Cont to follow per POC.   Follow Up Recommendations  No PT follow up;Supervision/Assistance - 24 hour     Does the patient have the potential to tolerate intense rehabilitation     Barriers to Discharge        Equipment Recommendations  Other (comment) (TBD)    Recommendations for Other Services OT consult  Frequency Min 3X/week   Progress towards PT Goals Progress towards PT goals: Progressing toward goals  Plan Current plan remains appropriate    Precautions / Restrictions Precautions Precautions: Fall Restrictions Weight Bearing Restrictions: No   Pertinent Vitals/Pain See comments for O2 sats; pt c/o 7/10 "head soreness"   Mobility  Bed Mobility Overal bed mobility: Needs Assistance Bed Mobility: Supine to Sit Supine to sit: Supervision General bed mobility comments: cues for hand placement and sequencing; incr time to complete bed mobility  Transfers Overall transfer level: Needs assistance Equipment used: Rolling walker (2 wheeled) Transfers: Sit to/from Stand Sit to Stand: Min assist General transfer comment: cues for hand placement and management of RW; pt  slightly unsteady with transfer; no LOB noted  Ambulation/Gait Ambulation/Gait assistance: Min assist Ambulation Distance (Feet): 20 Feet Assistive device: Rolling walker (2 wheeled) Gait Pattern/deviations: Step-through pattern;Decreased stride length;Narrow base of support Gait velocity: decreased Gait velocity interpretation: Below normal speed for age/gender General Gait Details: pt more steady ambulating with RW; still has difficult and requires (A) to manage at times; cues for sequencing and safety with ambulation; limited distance today due to headache and fatigue          PT Diagnosis:    PT Problem List:   PT Treatment Interventions:     PT Goals (current goals can now be found in the care plan section) Acute Rehab PT Goals Patient Stated Goal: pt lying supine; agreeable to ambulate around the room. stated "if i could just get my head right, id be ok"  PT Goal Formulation: With patient Time For Goal Achievement: 12/04/13 Potential to Achieve Goals: Good  Visit Information  Last PT Received On: 11/21/13 Assistance Needed: +1 History of Present Illness: Cameron Lam, [id number].o. male, visits in follow up of brain MRI.  Lung CA dx June 2014, Hepatic & Spenic lesions discovered with f/u imaging late 2014, and brain mass Jan 2015 after pt c/o headaches & balance issues in Dec.  Chemo since June 2014. Pt is s/p craniotomy for tumor resection.     Subjective Data  Patient Stated Goal: pt lying supine; agreeable to ambulate around the room. stated "if i could just get my head right, id be ok"    Cognition  Cognition Arousal/Alertness: Lethargic;Suspect  due to medications Behavior During Therapy: [facility] for tasks assessed/performed Overall Cognitive Status: Impaired/Different from baseline Area of Impairment: Problem solving;Attention;Safety/judgement Current Attention Level: Sustained Memory: Decreased short-term memory Safety/Judgement: Decreased awareness of deficits;Decreased  awareness of safety Problem Solving: Slow processing;Difficulty sequencing;Requires verbal cues;Requires tactile cues General Comments: requires incr time processing commands     Balance  Balance Overall balance assessment: Needs assistance Sitting-balance support: Feet supported;No upper extremity supported Sitting balance-Leahy Scale: Good Standing balance support: During functional activity;Bilateral upper extremity supported Standing balance-Leahy Scale: Fair Standing balance comment: leans posteriorly at times; cues to correct; UE supported by RW  End of Session PT - End of Session Equipment Utilized During Treatment: Gait belt;Oxygen (pt on 15L O2 ) Activity Tolerance: Patient limited by pain Patient left: in chair;with call bell/phone within reach Nurse Communication: Mobility status   GP     Gustavus Bryant, [place] 11/21/2013, 4:03 PM

## 2013-11-21 NOTE — Progress Notes (Signed)
ANTIBIOTIC CONSULT NOTE - INITIAL  Pharmacy Consult for Zosyn Indication: pneumonia  No Known Allergies  Patient Measurements: Height: 5\' 10"  (177.8 cm) Weight: 136 lb 7.4 oz (61.9 kg) IBW/kg (Calculated) : 73  Vital Signs: Temp: 97.6 F (36.4 C) (01/30 0302) Temp src: Axillary (01/30 0302) BP: 137/89 mmHg (01/30 1100) Pulse Rate: 101 (01/30 1100) Intake/Output from previous day: 01/29 0701 - 01/30 0700 In: 2965 [P.O.:960; I.V.:1800; IV Piggyback:205] Out: 2165 [Urine:2165] Intake/Output from this shift: Total I/O In: 705 [P.O.:300; I.V.:300; IV Piggyback:105] Out: 280 [Urine:280]  Labs: No results found for this basename: WBC, HGB, PLT, LABCREA, CREATININE,  in the last 72 hours Estimated Creatinine Clearance: Cameron.7 ml/min (by C-G formula based on Cr of 0.68). No results found for this basename: Letta Median, VANCORANDOM, [place], GENTPEAK, [place], [place], [place], TOBRARND, AMIKACINPEAK, AMIKACINTROU, AMIKACIN,  in the last 72 hours   Microbiology: Recent Results (from the past 720 hour(s))  TECHNOLOGIST REVIEW     Status: None   Collection Time    10/22/13  2:43 PM      Result Value Range Status   Technologist Review Variant lymphs present   Final  MRSA PCR SCREENING     Status: None   Collection Time    11/19/13  4:10 AM      Result Value Range Status   MRSA by PCR NEGATIVE  NEGATIVE Final   Comment:            The GeneXpert MRSA Assay (FDA     approved for NASAL specimens     only), is one component of a     comprehensive MRSA colonization     surveillance program. It is not     intended to diagnose MRSA     infection nor to guide or     monitor treatment for     MRSA infections.    Medical History: Past Medical History  Diagnosis Date  . Shingles outbreak 06/16/10    Lt anterior and postior thigh/buttocks  . Hypertension   . Tobacco abuse   . Lung cancer   . Hx of radiation therapy 6/26 - 05/07/13    LLL lung 37.5 gray in 15  fractions  . COPD (chronic obstructive pulmonary disease)   . Anemia     Medications:  Prescriptions prior to admission  Medication Sig Dispense Refill  . albuterol (PROVENTIL HFA;VENTOLIN HFA) 108 (90 BASE) MCG/ACT inhaler Inhale 2 puffs into the lungs every 6 (six) hours as needed for wheezing.  1 Inhaler  3  . amLODipine (NORVASC) 10 MG tablet Take 1 tablet (10 mg total) by mouth daily.  90 tablet  3  . aspirin 81 MG tablet Take 1 tablet (81 mg total) by mouth daily.  30 tablet  11  . dexamethasone (DECADRON) 4 MG tablet Take 1 tablet (4 mg total) by mouth 4 (four) times daily.  40 tablet  1  . feeding supplement (ENSURE COMPLETE) LIQD Take 237 mLs by mouth 2 (two) times daily between meals.  60 Bottle  0  . ferrous sulfate 325 (65 FE) MG tablet Take 325 mg by mouth 2 (two) times daily with a meal.      . Multiple Vitamin (MULTIVITAMIN) tablet Take 1 tablet by mouth daily.      . prochlorperazine (COMPAZINE) 10 MG tablet Take 10 mg by mouth every 6 (six) hours as needed for nausea or vomiting.       Assessment: 80 y.o. 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 Admitted 1/26 for brain tumor excision. Pt now with hypoxia and to begin Zosyn for PNA. Afeb. Wbc 35.8 (1/26). Renal function appears normal. Cx pending.   Goal of Therapy:  Eradication of infection  Plan:  1. Zosyn 3.375gm IV q8h - first dose over 30 min and subsequent doses over 4 hours 2. Will f/u micro data, renal function, pt's clinical condition  Sherlon Handing, PharmD, BCPS Clinical pharmacist, pager [phone]  11/21/2013,1:57 PM

## 2013-11-21 NOTE — Progress Notes (Signed)
Subjective: Patient reports "I'm good"  Objective: Vital signs in last 24 hours: Temp:  [97 F (36.1 C)-98.9 F (37.2 C)] 97.6 F (36.4 C) (01/30 0302) Pulse Rate:  [90-117] 93 (01/30 0500) Resp:  [12-21] 21 (01/30 0500) BP: (109-160)/(67-90) 137/87 mmHg (01/30 0500) SpO2:  [87 %-100 %] 100 % (01/30 0500)  Intake/Output from previous day: 01/29 0701 - 01/30 0700 In: 2815 [P.O.:960; I.V.:1650; IV Piggyback:205] Out: 1915 [Urine:1915] Intake/Output this shift:    Alert, conversant.  Sats decreased during sleep and on exertion to upper 80's, increase when awake & talking. Pt notes home breathing tx's qAM. Also question his baseline given Hx. Drsg intact, dry. MAEW.  Lab Results: No results found for this basename: WBC, HGB, HCT, PLT,  in the last 72 hours BMET No results found for this basename: NA, K, CL, CO2, GLUCOSE, BUN, CREATININE, CALCIUM,  in the last 72 hours  Studies/Results: Mr Cameron Lam Wo Contrast  11/20/2013   CLINICAL DATA:  80 year old male status post craniotomy for tumor removal. Cerebellar tumor. History of lung cancer. Initial encounter.  This study was identified as missing a report at 1737 hr on 11/20/2013.  EXAM: MRI HEAD WITHOUT AND WITH CONTRAST  TECHNIQUE: Multiplanar, multiecho pulse sequences of the brain and surrounding structures were obtained without and with intravenous contrast.  CONTRAST:  71mL MULTIHANCE GADOBENATE DIMEGLUMINE 529 MG/ML IV SOLN  COMPARISON:  Preoperative MRI 11/12/2013 and earlier.  FINDINGS: Sequelae of occipital craniotomy. Small volume pneumocephalus and pneumo ventricle. Small volume suboccipital subcutaneous fluid collection, up to 4 cm in length by 1.5 by 2 cm.  Resection cavity with fluid in postoperative blood products. Subsequent susceptibility artifact on diffusion-weighted imaging. There might be a small volume of restricted diffusion along the posterior inferior resection site (series 5, image 9 and series 500, image 9).  Following contrast, no definite residual abnormal enhancement (series 11), with mild vascular related enhancement suspected along the margin with the fourth ventricle. Left cerebellar edema has mildly decreased, along with mass effect on the fourth ventricle.  No ventriculomegaly. Overall stable basilar cisterns. Major intracranial vascular flow voids are stable.  No new abnormal enhancement or new metastatic lesion identified. Stable to decreased curvilinear right parietal cortex enhancement, favor vascular related.  No other restricted diffusion. Incidental chronic right inferior cerebellar infarct again noted. Chronic appearing linear and cruciform T2 hyperintensity in the pons is stable. Supratentorial gray and white matter signal is stable.  Pituitary and visualized cervical spine remain within normal limits. Bone marrow signal is stable. Stable and negative orbits soft tissues. Stable paranasal sinuses. New trace mastoid fluid on the left. Small volume retained secretions in the nasopharynx.  IMPRESSION: 1. Interval gross total resection of left cerebellar cystic and enhancing mass. Improved patency of the fourth ventricle and mildly decreased cerebellar edema. 2. Small volume pneumocephalus. Postoperative blood products with associated susceptibility artifact. Artifact versus small volume infarct along the posterior inferior resection cavity. 3. Small suboccipital postoperative fluid collection.   Electronically Signed   By: Lars Pinks M.D.   On: 11/20/2013 18:12    Assessment/Plan:   LOS: 3 days  Per Dr. Vertell Limber, will change breathing tx to qAM and PRN & keep him on unit until respiratory status has stabilized.   Verdis Prime 11/21/2013, 7:37 AM

## 2013-11-21 NOTE — Progress Notes (Signed)
Hopefully, breathing treatments will help O2 Sats. When these are stable, patient can move to floor and then home.

## 2013-11-21 NOTE — Progress Notes (Signed)
RT Note: Encouraged pt with use of IS & flutter valve. Pt able to achieve 750 with IS. SpO2 dropped to mid 80s, pt given HHN, encouraged coughing. SpO2 91% on VM, RT to monitor.

## 2013-11-22 ENCOUNTER — Inpatient Hospital Stay (HOSPITAL_COMMUNITY): Payer: Medicare Other

## 2013-11-22 DIAGNOSIS — J441 Chronic obstructive pulmonary disease with (acute) exacerbation: Secondary | ICD-10-CM

## 2013-11-22 LAB — BASIC METABOLIC PANEL
BUN: 22 mg/dL (ref 6–23)
CALCIUM: 9.1 mg/dL (ref 8.4–10.5)
CO2: 30 mEq/L (ref 19–32)
Chloride: 89 mEq/L — ABNORMAL LOW (ref 96–112)
Creatinine, Ser: 0.78 mg/dL (ref 0.50–1.35)
GFR calc Af Amer: >90 mL/min (ref >90)
GFR calc non Af Amer: 85 mL/min — ABNORMAL LOW (ref >90)
GLUCOSE: 118 mg/dL — AB (ref 70–99)
Potassium: 4.8 mEq/L (ref 3.7–5.3)
Sodium: 131 mEq/L — ABNORMAL LOW (ref 137–147)

## 2013-11-22 LAB — PROCALCITONIN: PROCALCITONIN: 0.26 ng/mL

## 2013-11-22 LAB — URINE CULTURE
Colony Count: NO GROWTH
Culture: NO GROWTH

## 2013-11-22 LAB — CBC
HEMATOCRIT: 32 % — AB (ref 39.0–52.0)
HEMOGLOBIN: 10 g/dL — AB (ref 13.0–17.0)
MCH: 22.8 pg — AB (ref 26.0–34.0)
MCHC: 31.3 g/dL (ref 30.0–36.0)
MCV: 72.9 fL — AB (ref 78.0–100.0)
Platelets: 265 10*3/uL (ref 150–400)
RBC: 4.39 MIL/uL (ref 4.22–5.81)
RDW: 19.9 % — ABNORMAL HIGH (ref 11.5–15.5)
WBC: 41.1 10*3/uL — ABNORMAL HIGH (ref 4.0–10.5)

## 2013-11-22 LAB — PHOSPHORUS: Phosphorus: 2.9 mg/dL (ref 2.3–4.6)

## 2013-11-22 LAB — MAGNESIUM: Magnesium: 1.8 mg/dL (ref 1.5–2.5)

## 2013-11-22 MED ORDER — FUROSEMIDE 10 MG/ML IJ SOLN
40.0000 mg | Freq: Once | INTRAMUSCULAR | Status: AC
  Administered 2013-11-22: 40 mg via INTRAVENOUS
  Filled 2013-11-22: qty 4

## 2013-11-22 NOTE — Progress Notes (Signed)
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 Admitted on 1/26 for crani for tumor excision by dr Cameron Lam. PCCM asked to assess on 1/30 for persistent hypoxia.   SIGNIFICANT EVENTS / STUDIES:  1/26: CRANIOTOMY TUMOR EXCISION (N/A) - Craniotomy for tumor excision Suboccipital craniectomy with resection of posterior fossa tumor and microdissection 1/29 MR brain: . Interval gross total resection of left cerebellar cystic and enhancing mass. Improved patency of the fourth ventricle and mildly decreased cerebellar edema. 2. Small volume pneumocephalus. Postoperative blood products with associated susceptibility artifact. Artifact versus small volume  infarct along the posterior inferior resection cavity. 3. Small suboccipital postoperative fluid collection. 1/30 swallow eval >>>  LINES / TUBES: Right IJ CVL 1/26>>>  CULTURES: bc 1/30>>> UC 1/30>>>  ANTIBIOTICS: Zosyn 1/30>>>  SUBJECTIVE:  No acute complaints, no events overnight.  VITAL SIGNS: Temp:  [97.8 F (36.6 C)-98.9 F (37.2 C)] 97.8 F (36.6 C) (01/31 0314) Pulse Rate:  [90-113] 102 (01/31 0900) Resp:  [12-24] 16 (01/31 0900) BP: (122-179)/(69-102) 141/90 mmHg (01/31 0900) SpO2:  [86 %-98 %] 94 % (01/31 0900) FiO2 (%):  [55 %] 55 % (01/31 0700) HEMODYNAMICS: CVP:  [4 mmHg-7 mmHg] 4 mmHg VENTILATOR SETTINGS: Vent Mode:  [-]  FiO2 (%):  [55 %] 55 % INTAKE / OUTPUT: Intake/Output     01/30  0701 - 01/31 0700 01/31 0701 - 02/01 0700   P.O. 300    I.V. (mL/kg) 300 (4.8)    IV Piggyback 310    Total Intake(mL/kg) 910 (14.7)    Urine (mL/kg/hr) 2790 (1.9) 300 (1.2)   Total Output 2790 300   Net -1880 -300        Urine Occurrence 1 x    Stool Occurrence 1 x      PHYSICAL EXAMINATION: General:  Sleepy 80 year old AAM, oriented, but in no acute distress.  Neuro:  Awake, oriented. C/o head ache  HEENT:  Posterior dressing CD&I. No JVD  Cardiovascular:  rrr Lungs:  Bilateral coarse rhonchi no accessory muscle use  Abdomen:  Non-tender + bowel sounds  Musculoskeletal:  Intact  Skin:  Intact   LABS:  CBC  Recent Labs Lab 11/17/13 1554 11/21/13 1430 11/22/13 0530  WBC 35.8* 47.2* 41.1*  HGB 10.3* 9.7* 10.0*  HCT 33.5* 31.0* 32.0*  PLT 304 255 265   Coag's No results found for this basename: APTT, INR,  in the last 168 hours BMET  Recent Labs Lab 11/17/13 1554 11/21/13 1430 11/22/13 0530  NA 133* 127* 131*  K 4.5 5.5* 4.8  CL 96 92* 89*  CO2 21 25 30   BUN 16 19 22   CREATININE 0.68 0.59 0.78  GLUCOSE 169* 143* 118*   Electrolytes  Recent Labs Lab 11/17/13 1554 11/21/13 1430 11/22/13 0530  CALCIUM 8.6 8.1* 9.1  MG  --  1.9 1.8  PHOS  --  1.9* 2.9   Sepsis Markers  Recent  Labs Lab 11/21/13 1430 11/22/13 0535  PROCALCITON 0.44 0.26   ABG  Recent Labs Lab 11/21/13 1359  PHART 7.448  PCO2ART 37.8  PO2ART 55.0*   Liver Enzymes No results found for this basename: AST, ALT, ALKPHOS, BILITOT, ALBUMIN,  in the last 168 hours Cardiac Enzymes No results found for this basename: TROPONINI, PROBNP,  in the last 168 hours Glucose No results found for this basename: GLUCAP,  in the last 168 hours  Imaging Dg Chest Port 1 View  11/22/2013   CLINICAL DATA:  Acute respiratory failure.  COPD.  EXAM: PORTABLE CHEST - 1 VIEW  COMPARISON:  11/21/2013  FINDINGS: Severe pulmonary emphysema again demonstrated. Bibasilar airspace disease shows mild  improvement since previous study. No new or worsening areas of airspace disease are seen. Mild cardiomegaly stable. Right subclavian central venous catheter remains in appropriate position.  IMPRESSION: Interval improvement in bibasilar airspace disease.  Severe emphysema.   Electronically Signed   By: Earle Gell M.D.   On: 11/22/2013 08:16   Dg Chest Port 1 View  11/21/2013   CLINICAL DATA:  Shortness of breath.  EXAM: PORTABLE CHEST - 1 VIEW  COMPARISON:  11/18/2013 and 11/17/2013  FINDINGS: The patient has developed prominent bibasilar pulmonary infiltrates with increased atelectasis. Right central line tip is in the superior vena cava, slightly retracted since the prior study. Heart size and vascularity are normal. No acute osseous abnormality.  IMPRESSION: New prominent bibasilar pulmonary infiltrates.   Electronically Signed   By: Rozetta Nunnery M.D.   On: 11/21/2013 13:14     CXR: significant increasein bibasilar infiltrates. Think significant component of atx, but can't exclude element of edema/PNA   ASSESSMENT / PLAN:  PULMONARY A: acute respiratory failure due to progressive atelectasis +/- PNA. He does endorse coughing w/ pos. Certainly element of aspiration is a concern in this setting as well. Also need to r/o element of edema  P:   - Wean FiO2 as able, remains hypoxemic however. - Pulm hygiene as ordered. - SLP eval for dysphagia pending as I suspect aspiration here. - KVO IVF. - Transduce CVP and follow. - Diuresed relatively well see below. - See ID section.  CARDIOVASCULAR A: HTN  P:  - Lasix x1. - Tele. - Consider adding back norvasc following clinical response. Will cont PRN labetolol for now.  RENAL A:  Mild hyponatremia on admit P:   - Lasix 40 mg IV x1. - BMET in AM.  GASTROINTESTINAL A:   Possible dysphagia  P:   - NPO except meds until passes swallow evaluation. - SLP eval will reorder.  HEMATOLOGIC A:  NSCLC (squamous cell) w/ mets to liver and brain        Chronic anemia       Leukocytosis: possible r/t steroids  P:  - PAS to LEs. - Trend CBC.  INFECTIOUS A:  Possible aspiration PNA  P:   - Empiric abx as ordered. - Will narrow pending cultures.  ENDOCRINE A:  Hyperglycemia  P:   - Check CBGs q 4. - Consider SSI if remains >150, on steroids.  NEUROLOGIC A: s/p crani for Metastatic squamous cell left cerebellar tumor   P:   - Decadron per neuro. - Post-op care as directed by neuro.  TODAY'S SUMMARY: His persistent hypoxia is explained by his progressive bibasilar atelectasis. Concern remains for aspiration PNA.  Swallow evaluation pending.  Would hold in the ICU overnight given desaturation and risk for respiratory failure.  Diurese more today and  if O2 demand decrease can move out in AM.  I have personally obtained a history, examined the patient, evaluated laboratory and imaging results, formulated the assessment and plan and placed orders.  CRITICAL CARE: The patient is critically ill with multiple organ systems failure and requires high complexity decision making for assessment and support, frequent evaluation and titration of therapies, application of advanced monitoring technologies and extensive interpretation of multiple databases. Critical Care Time devoted to patient care services described in this note is 35 minutes.   Rush Farmer, M.D. Pulmonary and [place] Pager: [phone]  11/22/2013, 11:09 AM

## 2013-11-22 NOTE — Progress Notes (Signed)
Subjective: Patient reports Patient is postop day 4 from craniotomy for removal and excision of a large cerebellar metastasis doing well without complaints this morning  Objective: Vital signs in last 24 hours: Temp:  [97.8 F (36.6 C)-98.9 F (37.2 C)] 97.8 F (36.6 C) (01/31 0314) Pulse Rate:  [90-113] 97 (01/31 0700) Resp:  [12-24] 16 (01/31 0700) BP: (122-179)/(69-130) 132/78 mmHg (01/31 0700) SpO2:  [85 %-99 %] 95 % (01/31 0700) FiO2 (%):  [55 %] 55 % (01/31 0700)  Intake/Output from previous day: 01/30 0701 - 01/31 0700 In: 910 [P.O.:300; I.V.:300; IV Piggyback:310] Out: 2790 [Urine:2790] Intake/Output this shift:    Awake alert oriented strength appears to be 5 out of 5  Lab Results:  Recent Labs  11/21/13 1430 11/22/13 0530  WBC 47.2* 41.1*  HGB 9.7* 10.0*  HCT 31.0* 32.0*  PLT 255 265   BMET  Recent Labs  11/21/13 1430 11/22/13 0530  NA 127* 131*  K 5.5* 4.8  CL 92* 89*  CO2 25 30  GLUCOSE 143* 118*  BUN 19 22  CREATININE 0.59 0.78  CALCIUM 8.1* 9.1    Studies/Results: Dg Chest Port 1 View  11/22/2013   CLINICAL DATA:  Acute respiratory failure.  COPD.  EXAM: PORTABLE CHEST - 1 VIEW  COMPARISON:  11/21/2013  FINDINGS: Severe pulmonary emphysema again demonstrated. Bibasilar airspace disease shows mild improvement since previous study. No new or worsening areas of airspace disease are seen. Mild cardiomegaly stable. Right subclavian central venous catheter remains in appropriate position.  IMPRESSION: Interval improvement in bibasilar airspace disease.  Severe emphysema.   Electronically Signed   By: Earle Gell M.D.   On: 11/22/2013 08:16   Dg Chest Port 1 View  11/21/2013   CLINICAL DATA:  Shortness of breath.  EXAM: PORTABLE CHEST - 1 VIEW  COMPARISON:  11/18/2013 and 11/17/2013  FINDINGS: The patient has developed prominent bibasilar pulmonary infiltrates with increased atelectasis. Right central line tip is in the superior vena cava, slightly  retracted since the prior study. Heart size and vascularity are normal. No acute osseous abnormality.  IMPRESSION: New prominent bibasilar pulmonary infiltrates.   Electronically Signed   By: Rozetta Nunnery M.D.   On: 11/21/2013 13:14    Assessment/Plan: Progressive mobilization continued physical occupational therapy when patient's pulmonary status is stable from critical care his perspective it is okay with me for him to be transferred to floor.  LOS: 4 days     Cameron Lam P 11/22/2013, 8:25 AM

## 2013-11-22 NOTE — Evaluation (Signed)
Clinical/Bedside Swallow Evaluation Patient Details  Name: Cameron Lam MRN: [id number] Date of Birth: [birth year]  Today's Date: 11/22/2013 Time: 0920-0940 SLP Time Calculation (min): 20 min  Past Medical History:  Past Medical History  Diagnosis Date  . Shingles outbreak 06/16/10    Lt anterior and postior thigh/buttocks  . Hypertension   . Tobacco abuse   . Lung cancer   . Hx of radiation therapy 6/26 - 05/07/13    LLL lung 37.5 gray in 15 fractions  . COPD (chronic obstructive pulmonary disease)   . Anemia    Past Surgical History:  Past Surgical History  Procedure Laterality Date  . Video bronchoscopy Bilateral 04/10/2013    Procedure: VIDEO BRONCHOSCOPY WITH FLUORO;  Surgeon: Wilhelmina Mcardle, MD;  Location: [facility];  Service: Cardiopulmonary;  Laterality: Bilateral;  . Appendectomy    . Craniotomy N/A 11/18/2013    Procedure: CRANIOTOMY TUMOR EXCISION;  Surgeon: Erline Levine, MD;  Location: [facility];  Service: Neurosurgery;  Laterality: N/A;  Craniotomy for tumor excision   HPI:  80 year old male with hx of COPD postop day 4 from craniotomy for removal and excision of a large cerebellar metastasis. Lung CA dx June 2014, Hepatic & Spenic lesions discovered with f/u imaging late 2014, and brain mass Jan 2015 after pt c/o headaches & balance issues in Dec. Chemo since June 2014. Most recent CXR with significant increasein bibasilar infiltrates. Think significant component of atx, but can't exclude element of edema/PNA. He does endorse coughing w/ pos per MD notes.    Assessment / Plan / Recommendation Clinical Impression  Patient presents with a functional appearing oropharyngeal swallow without overt indication of aspiration. SLP assisted with venti-mask removal and placement during evaluation as patient independently taking rest breaks to control SOB. O2 saturation levels remaining stable. No SLP f/u indicated. If MD, feels necessary to r/o silent aspiration, MBS would  be warranted. Otherwise, SLP signing off.     Aspiration Risk  Mild    Diet Recommendation Regular;Thin liquid   Liquid Administration via: Cup;Straw Medication Administration: Whole meds with liquid Supervision: Patient able to self feed;Intermittent supervision to cue for compensatory strategies Compensations: Slow rate;Small sips/bites (rest breaks as needed to control SOB) Postural Changes and/or Swallow Maneuvers: Seated upright 90 degrees    Other  Recommendations Oral Care Recommendations: Oral care BID   Follow Up Recommendations  None       Pertinent Vitals/Pain n/a     Swallow Study    General HPI: 80 year old male with hx of COPD postop day 4 from craniotomy for removal and excision of a large cerebellar metastasis. Lung CA dx June 2014, Hepatic & Spenic lesions discovered with f/u imaging late 2014, and brain mass Jan 2015 after pt c/o headaches & balance issues in Dec. Chemo since June 2014. Most recent CXR with significant increasein bibasilar infiltrates. Think significant component of atx, but can't exclude element of edema/PNA. He does endorse coughing w/ pos per MD notes.  Type of Study: Bedside swallow evaluation Previous Swallow Assessment: none Diet Prior to this Study: NPO Temperature Spikes Noted: No Respiratory Status: venti-mask (transitioned to nasal cannula from venti-mask post eval) History of Recent Intubation: Yes Length of Intubations (days):  (surgery only) Behavior/Cognition: Alert;Cooperative;Pleasant mood Oral Cavity - Dentition: Dentures, top;Dentures, bottom Self-Feeding Abilities: Able to feed self Patient Positioning: Upright in bed Baseline Vocal Quality: Clear Volitional Cough: Strong Volitional Swallow: Able to elicit    Oral/Motor/Sensory Function Overall Oral Motor/Sensory Function:  Appears within functional limits for tasks assessed   Ice Chips Ice chips: Not tested   Thin Liquid Thin Liquid: Within functional  limits Presentation: Cup;Self Fed;Straw    Nectar Thick Nectar Thick Liquid: Not tested   Honey Thick Honey Thick Liquid: Not tested   Puree Puree: Within functional limits Presentation: Spoon;Self Fed   Solid   GO   Zach Tietje MA, CCC-SLP [phone]  Solid: Within functional limits Presentation: Self Fed       Bee Marchiano Meryl 11/22/2013,9:49 AM

## 2013-11-23 ENCOUNTER — Encounter (HOSPITAL_COMMUNITY): Payer: Self-pay | Admitting: *Deleted

## 2013-11-23 ENCOUNTER — Inpatient Hospital Stay (HOSPITAL_COMMUNITY): Payer: Medicare Other

## 2013-11-23 LAB — CBC
HEMATOCRIT: 30.6 % — AB (ref 39.0–52.0)
HEMOGLOBIN: 9.4 g/dL — AB (ref 13.0–17.0)
MCH: 22.5 pg — AB (ref 26.0–34.0)
MCHC: 30.7 g/dL (ref 30.0–36.0)
MCV: 73.2 fL — ABNORMAL LOW (ref 78.0–100.0)
Platelets: 244 10*3/uL (ref 150–400)
RBC: 4.18 MIL/uL — ABNORMAL LOW (ref 4.22–5.81)
RDW: 19.9 % — ABNORMAL HIGH (ref 11.5–15.5)
WBC: 37.8 10*3/uL — ABNORMAL HIGH (ref 4.0–10.5)

## 2013-11-23 LAB — PHOSPHORUS: Phosphorus: 2.6 mg/dL (ref 2.3–4.6)

## 2013-11-23 LAB — BASIC METABOLIC PANEL
BUN: 30 mg/dL — ABNORMAL HIGH (ref 6–23)
CO2: 32 mEq/L (ref 19–32)
Calcium: 8.5 mg/dL (ref 8.4–10.5)
Chloride: 88 mEq/L — ABNORMAL LOW (ref 96–112)
Creatinine, Ser: 0.83 mg/dL (ref 0.50–1.35)
GFR calc Af Amer: >90 mL/min (ref >90)
GFR, EST NON AFRICAN AMERICAN: 83 mL/min — AB (ref >90)
Glucose, Bld: 134 mg/dL — ABNORMAL HIGH (ref 70–99)
POTASSIUM: 4.5 meq/L (ref 3.7–5.3)
Sodium: 130 mEq/L — ABNORMAL LOW (ref 137–147)

## 2013-11-23 LAB — MAGNESIUM: Magnesium: 1.8 mg/dL (ref 1.5–2.5)

## 2013-11-23 LAB — PROCALCITONIN: Procalcitonin: 0.21 ng/mL

## 2013-11-23 MED ORDER — PANTOPRAZOLE SODIUM 40 MG PO TBEC
40.0000 mg | DELAYED_RELEASE_TABLET | Freq: Every day | ORAL | Status: DC
  Administered 2013-11-24 – 2013-11-27 (×4): 40 mg via ORAL
  Filled 2013-11-23 (×4): qty 1

## 2013-11-23 MED ORDER — LEVOFLOXACIN 500 MG PO TABS
500.0000 mg | ORAL_TABLET | Freq: Every day | ORAL | Status: DC
  Administered 2013-11-23 – 2013-11-27 (×5): 500 mg via ORAL
  Filled 2013-11-23 (×6): qty 1

## 2013-11-23 NOTE — Progress Notes (Signed)
Physical Therapy Treatment Patient Details Name: Cameron Lam MRN: [id number] DOB: 08-03-1934 Today's Date: 11/23/2013 Time: 0865-7846 PT Time Calculation (min): 25 min  PT Assessment / Plan / Recommendation  History of Present Illness Cameron Lam, 80y.o. male, visits in follow up of brain MRI.  Lung CA dx June 2014, Hepatic & Spenic lesions discovered with f/u imaging late 2014, and brain mass Jan 2015 after pt c/o headaches & balance issues in Dec.  Chemo since June 2014. Pt is s/p craniotomy for tumor resection.    PT Comments   Pt able to progress with therapy and his mobility. Pt was more unsteady today with gt. Had difficulty managing RW around obstacles and became off balance multiple times. Recommendations updated to include HHPT upon acute D/C. Pt will require 24/7 (A). Is a fall risk due to decreased balance and decreased awareness of deficits.   Follow Up Recommendations  Supervision/Assistance - 24 hour;Home health PT     Does the patient have the potential to tolerate intense rehabilitation     Barriers to Discharge        Equipment Recommendations  Rolling walker with 5" wheels;Other (comment) (pt states he may have one at home)    Recommendations for Other Services    Frequency Min 3X/week   Progress towards PT Goals Progress towards PT goals: Progressing toward goals  Plan Current plan remains appropriate    Precautions / Restrictions Precautions Precautions: Fall Restrictions Weight Bearing Restrictions: No   Pertinent Vitals/Pain 5/10 headache.     Mobility  Bed Mobility Overal bed mobility: Needs Assistance Bed Mobility: Supine to Sit Supine to sit: Modified independent (Device/Increase time) Transfers Overall transfer level: Needs assistance Equipment used: Rolling walker (2 wheeled) Transfers: Sit to/from Stand Sit to Stand: Supervision General transfer comment: cues for hand placement and safe management of RW Ambulation/Gait Ambulation/Gait  assistance: Min assist Ambulation Distance (Feet): 150 Feet Assistive device: Rolling walker (2 wheeled) Gait Pattern/deviations: Scissoring;Narrow base of support;Trunk flexed;Shuffle;Drifts right/left;Step-through pattern Gait velocity: decreased Gait velocity interpretation: Below normal speed for age/gender General Gait Details: pt very unsteady with ambulation today; pt tends to scissor walk with Rt LE at times and seems unaware that he is running into objects with his RW; (A) to maintain balance; pt also drifting at times; max cues for sequencing and safety; requires (A) to maintain balance and manage RW; pt is a fall risk     Exercises Other Exercises Other Exercises: encouraged to perform LE AROM in sititng; able to perform LAQ both LEs x10 and hip flexion in sitting x10 each    PT Diagnosis:    PT Problem List:   PT Treatment Interventions:     PT Goals (current goals can now be found in the care plan section) Acute Rehab PT Goals Patient Stated Goal: to go home  PT Goal Formulation: With patient Time For Goal Achievement: 12/04/13 Potential to Achieve Goals: Good  Visit Information  Last PT Received On: 11/23/13 Assistance Needed: +1 History of Present Illness: Cameron Lam, [id number].o. male, visits in follow up of brain MRI.  Lung CA dx June 2014, Hepatic & Spenic lesions discovered with f/u imaging late 2014, and brain mass Jan 2015 after pt c/o headaches & balance issues in Dec.  Chemo since June 2014. Pt is s/p craniotomy for tumor resection.     Subjective Data  Subjective: pt lying supine; agreeable to therapy. " i just want to get it over with so i can go home" Patient Stated  Goal: to go home    Cognition  Cognition Arousal/Alertness: Awake/alert Behavior During Therapy: WFL for tasks assessed/performed Overall Cognitive Status: Impaired/Different from baseline Area of Impairment: Problem solving;Safety/judgement Memory: Decreased short-term memory Safety/Judgement:  Decreased awareness of deficits;Decreased awareness of safety Problem Solving: Slow processing;Difficulty sequencing;Requires verbal cues;Requires tactile cues General Comments: pt running into obstacles in hallway; seems uanware of deficits today more so than sessions prior     Balance  Balance Overall balance assessment: Needs assistance Sitting-balance support: Feet supported;No upper extremity supported Sitting balance-Leahy Scale: Good Standing balance support: During functional activity;Bilateral upper extremity supported Standing balance-Leahy Scale: Fair Standing balance comment: UEs supported with RW  General Comments General comments (skin integrity, edema, etc.): incision on occipital portion of skull  End of Session PT - End of Session Equipment Utilized During Treatment: Gait belt;Oxygen Activity Tolerance: Patient tolerated treatment well Patient left: in chair;with call bell/phone within reach;with nursing/sitter in room Nurse Communication: Mobility status   GP     Gustavus Bryant, Elizabeth 11/23/2013, 10:35 AM

## 2013-11-23 NOTE — Progress Notes (Signed)
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 Admitted on 1/26 for crani for tumor excision by dr Vertell Limber. PCCM asked to assess on 1/30 for persistent hypoxia.   SIGNIFICANT EVENTS / STUDIES:  1/26: CRANIOTOMY TUMOR EXCISION (N/A) - Craniotomy for tumor excision Suboccipital craniectomy with resection of posterior fossa tumor and microdissection 1/29 MR brain: . Interval gross total resection of left cerebellar cystic and enhancing mass. Improved patency of the fourth ventricle and mildly decreased cerebellar edema. 2. Small volume pneumocephalus. Postoperative blood products with associated susceptibility artifact. Artifact versus small volume  infarct along the posterior inferior resection cavity. 3. Small suboccipital postoperative fluid collection. 1/30 swallow eval >>>  LINES / TUBES: Right IJ CVL 1/26>>>  CULTURES: bc 1/30>>> UC 1/30>>>  ANTIBIOTICS: Zosyn 1/30>>>2/1 Levaquin PO 2/1>>>2/6  SUBJECTIVE:  Much more alert and interactive this AM.  VITAL SIGNS: Temp:  [97.5 F (36.4 C)-98 F (36.7 C)] 97.5 F (36.4 C) (02/01 0000) Pulse Rate:  [75-114] 104 (02/01 0900) Resp:  [14-26] 15 (02/01 0900) BP: (104-158)/(64-111) 126/79 mmHg (02/01 0900) SpO2:  [87 %-100 %] 88 % (02/01 0900) FiO2 (%):  [55 %] 55 % (01/31 2000) HEMODYNAMICS:   VENTILATOR SETTINGS: Vent Mode:  [-]  FiO2 (%):  [55 %] 55 % INTAKE / OUTPUT: Intake/Output     01/31  0701 - 02/01 0700 02/01 0701 - 02/02 0700   P.O.  240   I.V. (mL/kg)     IV Piggyback 360    Total Intake(mL/kg) 360 (5.8) 240 (3.9)   Urine (mL/kg/hr) 2275 (1.5) 300 (1.6)   Total Output 2275 300   Net -1915 -60        Urine Occurrence 2 x    Stool Occurrence 1 x      PHYSICAL EXAMINATION: General:  Alert, awake and interactive.  Neuro:  Awake, oriented. HEENT:  Posterior dressing CD&I. No JVD  Cardiovascular:  rrr Lungs:  End exp wheezing.  Abdomen:  Non-tender + bowel sounds  Musculoskeletal:  Intact  Skin:  Intact   LABS:  CBC  Recent Labs Lab 11/21/13 1430 11/22/13 0530 11/23/13 0500  WBC 47.2* 41.1* 37.8*  HGB 9.7* 10.0* 9.4*  HCT 31.0* 32.0* 30.6*  PLT 255 265 244   Coag's No results found for this basename: APTT, INR,  in the last 168 hours BMET  Recent Labs Lab 11/21/13 1430 11/22/13 0530 11/23/13 0500  NA 127* 131* 130*  K 5.5* 4.8 4.5  CL 92* 89* 88*  CO2 25 30 32  BUN 19 22 30*  CREATININE 0.59 0.78 0.83  GLUCOSE 143* 118* 134*   Electrolytes  Recent Labs Lab 11/21/13 1430 11/22/13 0530 11/23/13 0500  CALCIUM 8.1* 9.1 8.5  MG 1.9 1.8 1.8  PHOS 1.9* 2.9 2.6   Sepsis Markers  Recent Labs Lab 11/21/13 1430 11/22/13 0535 11/23/13 0500  PROCALCITON 0.44 0.26 0.21   ABG  Recent Labs Lab  11/21/13 1359  PHART 7.448  PCO2ART 37.8  PO2ART 55.0*   Liver Enzymes No results found for this basename: AST, ALT, ALKPHOS, BILITOT, ALBUMIN,  in the last 168 hours Cardiac Enzymes No results found for this basename: TROPONINI, PROBNP,  in the last 168 hours Glucose No results found for this basename: GLUCAP,  in the last 168 hours  Imaging Dg Chest Port 1 View  11/22/2013   CLINICAL DATA:  Acute respiratory failure.  COPD.  EXAM: PORTABLE CHEST - 1 VIEW  COMPARISON:  11/21/2013  FINDINGS: Severe pulmonary emphysema again demonstrated. Bibasilar airspace disease shows mild improvement since previous study. No new or worsening areas of  airspace disease are seen. Mild cardiomegaly stable. Right subclavian central venous catheter remains in appropriate position.  IMPRESSION: Interval improvement in bibasilar airspace disease.  Severe emphysema.   Electronically Signed   By: Earle Gell M.D.   On: 11/22/2013 08:16   Dg Chest Port 1 View  11/21/2013   CLINICAL DATA:  Shortness of breath.  EXAM: PORTABLE CHEST - 1 VIEW  COMPARISON:  11/18/2013 and 11/17/2013  FINDINGS: The patient has developed prominent bibasilar pulmonary infiltrates with increased atelectasis. Right central line tip is in the superior vena cava, slightly retracted since the prior study. Heart size and vascularity are normal. No acute osseous abnormality.  IMPRESSION: New prominent bibasilar pulmonary infiltrates.   Electronically Signed   By: Rozetta Nunnery M.D.   On: 11/21/2013 13:14     CXR: significant increasein bibasilar infiltrates. Think significant component of atx, but can't exclude element of edema/PNA   ASSESSMENT / PLAN:  PULMONARY A: acute respiratory failure due to progressive atelectasis +/- PNA. He does endorse coughing w/ pos. Passed swallow evaluation now that is more awake.  P:   - Titrate O2 for sat of 88-92%, avoid sats of 100% given COPD. - Pulm hygiene as ordered. - KVO IVF. - Diuresed relatively well see below. - See ID section.  CARDIOVASCULAR A: HTN  P:  - Hold further lasix. - Tele. - Anti-HTN added and BP is now stable.  RENAL A:  Mild hyponatremia on admit P:   - Hold further lasix, diuresed well. - BMET in AM. - Replace electrolytes as needed.  GASTROINTESTINAL A:   Possible dysphagia  P:   - Begin heart healthy diet since passed swallow evaluation.  HEMATOLOGIC A:  NSCLC (squamous cell) w/ mets to liver and brain       Chronic anemia       Leukocytosis: possible r/t steroids  P:  - PAS to LEs. - Trend CBC.  INFECTIOUS A:  Possible aspiration PNA  P:   - Empiric abx as ordered. - Change zosyn to  levofloxacin for total of 8 days, stop date in place, last dose 2/6.  ENDOCRINE A:  Hyperglycemia  P:   - Check CBGs q 4. - Consider SSI if remains >150, on steroids.  NEUROLOGIC A: s/p crani for Metastatic squamous cell left cerebellar tumor   P:   - Decadron per neuro. - Post-op care as directed by neuro.  TODAY'S SUMMARY: Patient with much improved mental status, passed swallow evaluation, will d/c zosyn and start levaquin PO with stop date in place.  May move to tele and order placed since ok with NS.  PCCM will sign off, please call back if needed.  I have personally obtained a history, examined the patient, evaluated laboratory and imaging results, formulated the assessment and plan and placed orders.  Wesam G.  Nelda Marseille, M.D. Pulmonary and [place] Pager: [phone]  11/23/2013, 9:59 AM

## 2013-11-23 NOTE — Progress Notes (Signed)
Subjective: Patient reports He still well no significant headache  Objective: Vital signs in last 24 hours: Temp:  [97.5 F (36.4 C)-98 F (36.7 C)] 97.5 F (36.4 C) (02/01 0000) Pulse Rate:  [75-114] 97 (02/01 0700) Resp:  [14-26] 17 (02/01 0700) BP: (104-158)/(64-90) 104/74 mmHg (02/01 0700) SpO2:  [87 %-100 %] 87 % (02/01 0700) FiO2 (%):  [55 %] 55 % (01/31 2000)  Intake/Output from previous day: 01/31 0701 - 02/01 0700 In: 360 [IV Piggyback:360] Out: 2275 [Urine:2275] Intake/Output this shift:    Awake alert oriented strength 5 out of 5 no pronator drift wound clean and dry  Lab Results:  Recent Labs  11/22/13 0530 11/23/13 0500  WBC 41.1* 37.8*  HGB 10.0* 9.4*  HCT 32.0* 30.6*  PLT 265 244   BMET  Recent Labs  11/22/13 0530 11/23/13 0500  NA 131* 130*  K 4.8 4.5  CL 89* 88*  CO2 30 32  GLUCOSE 118* 134*  BUN 22 30*  CREATININE 0.78 0.83  CALCIUM 9.1 8.5    Studies/Results: Dg Chest Port 1 View  11/22/2013   CLINICAL DATA:  Acute respiratory failure.  COPD.  EXAM: PORTABLE CHEST - 1 VIEW  COMPARISON:  11/21/2013  FINDINGS: Severe pulmonary emphysema again demonstrated. Bibasilar airspace disease shows mild improvement since previous study. No new or worsening areas of airspace disease are seen. Mild cardiomegaly stable. Right subclavian central venous catheter remains in appropriate position.  IMPRESSION: Interval improvement in bibasilar airspace disease.  Severe emphysema.   Electronically Signed   By: Earle Gell M.D.   On: 11/22/2013 08:16   Dg Chest Port 1 View  11/21/2013   CLINICAL DATA:  Shortness of breath.  EXAM: PORTABLE CHEST - 1 VIEW  COMPARISON:  11/18/2013 and 11/17/2013  FINDINGS: The patient has developed prominent bibasilar pulmonary infiltrates with increased atelectasis. Right central line tip is in the superior vena cava, slightly retracted since the prior study. Heart size and vascularity are normal. No acute osseous abnormality.   IMPRESSION: New prominent bibasilar pulmonary infiltrates.   Electronically Signed   By: Rozetta Nunnery M.D.   On: 11/21/2013 13:14    Assessment/Plan: Progress mobilization continued physical and occupational therapy okay to be transferred to floor from my perspective   LOS: 5 days     Addisen Chappelle P 11/23/2013, 8:27 AM

## 2013-11-24 ENCOUNTER — Other Ambulatory Visit: Payer: Self-pay | Admitting: *Deleted

## 2013-11-24 MED ORDER — LEVETIRACETAM 500 MG PO TABS
500.0000 mg | ORAL_TABLET | Freq: Two times a day (BID) | ORAL | Status: DC
  Administered 2013-11-24 – 2013-11-27 (×7): 500 mg via ORAL
  Filled 2013-11-24 (×10): qty 1

## 2013-11-24 MED ORDER — BIOTENE DRY MOUTH MT LIQD
15.0000 mL | Freq: Two times a day (BID) | OROMUCOSAL | Status: DC
  Administered 2013-11-24 – 2013-11-27 (×7): 15 mL via OROMUCOSAL

## 2013-11-24 NOTE — Progress Notes (Signed)
Subjective: Patient reports "I feel pretty good"  Objective: Vital signs in last 24 hours: Temp:  [97.4 F (36.3 C)-99.9 F (37.7 C)] 98.2 F (36.8 C) (02/02 0335) Pulse Rate:  [82-104] 82 (02/02 0400) Resp:  [13-21] 15 (02/02 0400) BP: (107-134)/(60-111) 122/77 mmHg (02/02 0400) SpO2:  [85 %-99 %] 99 % (02/02 0400)  Intake/Output from previous day: 02/01 0701 - 02/02 0700 In: 50 [P.O.:600; IV Piggyback:210] Out: 1575 [Urine:1575] Intake/Output this shift:    Awakens to voice. Conversant. PEARL. No drift. MAEW. Incision without erythema, swelling, or drainage. Sats improved.   Lab Results:  Recent Labs  11/22/13 0530 11/23/13 0500  WBC 41.1* 37.8*  HGB 10.0* 9.4*  HCT 32.0* 30.6*  PLT 265 244   BMET  Recent Labs  11/22/13 0530 11/23/13 0500  NA 131* 130*  K 4.8 4.5  CL 89* 88*  CO2 30 32  GLUCOSE 118* 134*  BUN 22 30*  CREATININE 0.78 0.83  CALCIUM 9.1 8.5    Studies/Results: Dg Chest Port 1 View  11/23/2013   CLINICAL DATA:  Respiratory failure  EXAM: PORTABLE CHEST - 1 VIEW  COMPARISON:  DG CHEST 1V PORT dated 11/22/2013; CT CHEST W/CM dated 09/01/2013  FINDINGS: There is a right subclavian central venous catheter with the tip in unchanged position. Severe bilateral emphysematous changes. There is persistent right basilar airspace disease concerning for pneumonia. There is mild interval improvement left basilar airspace disease. There is an element of underlying chronic interstitial disease of the left lung base. There is no pneumothorax. The heart and mediastinal contours are unremarkable.  The osseous structures are unremarkable.  IMPRESSION: Persistent right basilar airspace disease concerning for pneumonia. There is mild interval improvement left basilar airspace disease. The residual left basilar airspace disease likely reflects an element of chronic interstitial disease with mild superimposed infiltrate.   Electronically Signed   By: Kathreen Devoid   On:  11/23/2013 11:24    Assessment/Plan: Improving   LOS: 6 days  Continue to mobilize as tolerated, advance diet as tolerated. Awaiting bed on telemetry.   Verdis Prime 11/24/2013, 7:58 AM

## 2013-11-24 NOTE — Progress Notes (Signed)
Thank you for consulting the Palliative Medicine Team at [facility] to meet your patient's and family's needs.   The reason that you asked [place] to see your patient is  For Clarification of GOC and options  We have scheduled your patient for a meeting: Tomorrow morning (11-25-2013) at 1000   The Surrogate decision make is:Gay Handler (daughter) and HPOA # C#  [phone]  Other family members that need to be present: wife and other children   Wadie Lessen NP  Palliative Medicine Team Team Phone # [phone] Pager [phone]

## 2013-11-24 NOTE — Progress Notes (Signed)
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 Admitted on 1/26 for crani for tumor excision by dr Vertell Limber. PCCM asked to assess on 1/30 for persistent hypoxia.   SIGNIFICANT EVENTS / STUDIES:  1/26: CRANIOTOMY TUMOR EXCISION (N/A) - Craniotomy for tumor excision Suboccipital craniectomy with resection of posterior fossa tumor and microdissection 1/29 MR brain: . Interval gross total resection of left cerebellar cystic and enhancing mass. Improved patency of the fourth ventricle and mildly decreased cerebellar edema. 2. Small volume pneumocephalus. Postoperative blood products with associated susceptibility artifact. Artifact versus small volume  infarct along the posterior inferior resection cavity. 3. Small suboccipital postoperative fluid collection. 1/30 swallow eval >>>  LINES / TUBES: Right IJ CVL 1/26>>>  CULTURES: bc 1/30>>> UC 1/30>>>  ANTIBIOTICS: Zosyn 1/30>>>2/1 Levaquin PO 2/1>>>2/6  SUBJECTIVE:  Much more alert and interactive this AM.  VITAL SIGNS: Temp:  [97.2 F (36.2 C)-99.9 F (37.7 C)] 97.2 F (36.2 C) (02/02 0800) Pulse Rate:  [79-96] 92 (02/02 1000) Resp:  [13-21] 21 (02/02 1000) BP: (110-140)/(60-87) 127/76 mmHg (02/02 1000) SpO2:  [85 %-99 %] 95 % (02/02 1000) HEMODYNAMICS:   VENTILATOR SETTINGS:   INTAKE / OUTPUT: Intake/Output     02/01 0701 - 02/02 0700 02/02 0701 - 02/03 0700   P.O. 600 240   IV Piggyback 210     Total Intake(mL/kg) 810 (13.1) 240 (3.9)   Urine (mL/kg/hr) 1575 (1.1) 275 (1.1)   Total Output 1575 275   Net -765 -35          PHYSICAL EXAMINATION: General:  Alert, awake and interactive.  Neuro:  Awake, oriented. HEENT:  Posterior dressing CD&I. No JVD  Cardiovascular:  rrr Lungs:  End exp wheezing.  Abdomen:  Non-tender + bowel sounds  Musculoskeletal:  Intact  Skin:  Intact   LABS:  CBC  Recent Labs Lab 11/21/13 1430 11/22/13 0530 11/23/13 0500  WBC 47.2* 41.1* 37.8*  HGB 9.7* 10.0* 9.4*  HCT 31.0* 32.0* 30.6*  PLT 255 265 244   Coag's No results found for this basename: APTT, INR,  in the last 168 hours BMET  Recent Labs Lab 11/21/13 1430 11/22/13 0530 11/23/13 0500  NA 127* 131* 130*  K 5.5* 4.8 4.5  CL 92* 89* 88*  CO2 25 30 32  BUN 19 22 30*  CREATININE 0.59 0.78 0.83  GLUCOSE 143* 118* 134*   Electrolytes  Recent Labs Lab 11/21/13 1430 11/22/13 0530 11/23/13 0500  CALCIUM 8.1* 9.1 8.5  MG 1.9 1.8 1.8  PHOS 1.9* 2.9 2.6   Sepsis Markers  Recent Labs Lab 11/21/13 1430 11/22/13 0535 11/23/13 0500  PROCALCITON 0.44 0.26 0.21   ABG  Recent Labs Lab 11/21/13 1359  PHART 7.448  PCO2ART 37.8  PO2ART 55.0*   Liver Enzymes No results found for this basename: AST, ALT, ALKPHOS, BILITOT, ALBUMIN,  in the last 168 hours Cardiac Enzymes No results found for this  basename: TROPONINI, PROBNP,  in the last 168 hours Glucose No results found for this basename: GLUCAP,  in the last 168 hours  Imaging Dg Chest Port 1 View  11/23/2013   CLINICAL DATA:  Respiratory failure  EXAM: PORTABLE CHEST - 1 VIEW  COMPARISON:  DG CHEST 1V PORT dated 11/22/2013; CT CHEST W/CM dated 09/01/2013  FINDINGS: There is a right subclavian central venous catheter with the tip in unchanged position. Severe bilateral emphysematous changes. There is persistent right basilar airspace disease concerning for pneumonia. There is mild interval improvement left basilar  airspace disease. There is an element of underlying chronic interstitial disease of the left lung base. There is no pneumothorax. The heart and mediastinal contours are unremarkable.  The osseous structures are unremarkable.  IMPRESSION: Persistent right basilar airspace disease concerning for pneumonia. There is mild interval improvement left basilar airspace disease. The residual left basilar airspace disease likely reflects an element of chronic interstitial disease with mild superimposed infiltrate.   Electronically Signed   By: Kathreen Devoid   On: 11/23/2013 11:24     CXR: significant increasein bibasilar infiltrates. Think significant component of atx, but can't exclude element of edema/PNA   ASSESSMENT / PLAN:  PULMONARY A: acute respiratory failure due to progressive atelectasis +/- PNA. He does endorse coughing w/ pos. Passed swallow evaluation now that is more awake.  P:   - Titrate O2 for sat of 88-92%, avoid sats of 100% given COPD. - Pulm hygiene as ordered. - KVO IVF. - Diuresed relatively well see below. - See ID section.  CARDIOVASCULAR A: HTN  P:  - Hold further lasix. - Tele. - Anti-HTN added and BP is now stable.  RENAL A:  Mild hyponatremia on admit P:   - Hold further lasix, diuresed well. - BMET in AM. - Replace electrolytes as needed.  GASTROINTESTINAL A:   Possible dysphagia  P:   - Begin heart healthy diet since passed swallow evaluation.  HEMATOLOGIC A:  NSCLC (squamous cell) w/ mets to liver and brain       Chronic anemia       Leukocytosis: possible r/t steroids  P:  - PAS to LEs. - Trend CBC.  INFECTIOUS A:  Possible aspiration PNA  P:   - Empiric abx as ordered. - Change zosyn to levofloxacin for total of 8 days, stop date in place, last dose 2/6.  ENDOCRINE A:  Hyperglycemia  P:   - Check CBGs q 4. - Consider SSI if remains >150, on steroids.  NEUROLOGIC A: s/p crani for Metastatic squamous cell left cerebellar tumor   P:   -  Decadron per neuro. - Post-op care as directed by neuro.  TODAY'S SUMMARY: Patient with much improved mental status, passed swallow evaluation, will d/c zosyn and start levaquin PO with stop date in place.  May move to tele and order placed since ok with NS.  PCCM will sign off, please call back if needed.  I have personally obtained a history, examined the patient, evaluated laboratory and imaging results, formulated the assessment and plan and placed orders.  Rush Farmer, M.D. Pulmonary and [place] Pager: [phone]  11/24/2013, 11:04 AM

## 2013-11-24 NOTE — Progress Notes (Signed)
Physical Therapy Treatment Patient Details Name: Cameron Lam MRN: [id number] DOB: Apr 29, 1934 Today's Date: 11/24/2013 Time: 0902-0920 PT Time Calculation (min): 18 min  PT Assessment / Plan / Recommendation  History of Present Illness Cameron Lam, 80y.o. male, visits in follow up of brain MRI.  Lung CA dx June 2014, Hepatic & Spenic lesions discovered with f/u imaging late 2014, and brain mass Jan 2015 after pt c/o headaches & balance issues in Dec.  Chemo since June 2014. Pt is s/p craniotomy for tumor resection.    PT Comments   Pt con't to have c/o headache and dizziness. Pt remains to have impaired balance and is at an increased falls risk. Pt did demo improved walker management this date compared to previous PT sessions. Pt to require 24/7 supervision/assist and use of RW for safe transition home. Pt to benefit from HHPT to then further transition to outpt for balance there ex to improve safety with ambulation.   Follow Up Recommendations  Home health PT;Supervision/Assistance - 24 hour     Does the patient have the potential to tolerate intense rehabilitation     Barriers to Discharge        Equipment Recommendations  Rolling walker with 5" wheels;Other (comment)    Recommendations for Other Services    Frequency Min 3X/week   Progress towards PT Goals Progress towards PT goals: Not progressing toward goals - comment  Plan Current plan remains appropriate    Precautions / Restrictions Precautions Precautions: Fall Restrictions Weight Bearing Restrictions: No   Pertinent Vitals/Pain 5/10 head pain    Mobility  Bed Mobility Overal bed mobility: Needs Assistance Bed Mobility: Supine to Sit Supine to sit: Supervision General bed mobility comments: v/c's for safe hand placement Transfers Overall transfer level: Needs assistance Equipment used: Rolling walker (2 wheeled) Transfers: Sit to/from Stand Sit to Stand: Min guard General transfer comment: max v/c's for safe  hand placement however pt with no carryover. Pt unable to self correct when pulling up on walker and pt falling back onto bed. Ambulation/Gait Ambulation/Gait assistance: Min assist Ambulation Distance (Feet): 150 Feet Assistive device: Rolling walker (2 wheeled) Gait Pattern/deviations: Step-through pattern;Scissoring;Narrow base of support Gait velocity: decreased General Gait Details: pt requires use of RW for safe ambulation, pt con't to have episodes of R LE cross over gait pattern in walker. pt c/o headache and dizziness. pt with improved walker management however con't to be unsteady and at increased falls risk.    Exercises     PT Diagnosis:    PT Problem List:   PT Treatment Interventions:     PT Goals (current goals can now be found in the care plan section) Acute Rehab PT Goals Patient Stated Goal: to go home   Visit Information  Last PT Received On: 11/24/13 Assistance Needed: +1 History of Present Illness: Cameron Lam, [phone].o. male, visits in follow up of brain MRI.  Lung CA dx June 2014, Hepatic & Spenic lesions discovered with f/u imaging late 2014, and brain mass Jan 2015 after pt c/o headaches & balance issues in Dec.  Chemo since June 2014. Pt is s/p craniotomy for tumor resection.     Subjective Data  Subjective: pt lying supine; agreeable to therapy. " i just want to get it over with so i can go home" Patient Stated Goal: to go home    Cognition  Cognition Arousal/Alertness: Awake/alert Behavior During Therapy: WFL for tasks assessed/performed Overall Cognitive Status: Impaired/Different from baseline Area of Impairment: Problem solving;Safety/judgement Safety/Judgement:  Decreased awareness of safety;Decreased awareness of deficits Problem Solving: Slow processing;Requires verbal cues;Requires tactile cues;Difficulty sequencing General Comments: pt with improved walker management and self-awareness this date    Balance  Balance Overall balance assessment:  Needs assistance  End of Session PT - End of Session Equipment Utilized During Treatment: Gait belt;Oxygen (5LO2) Activity Tolerance: Patient tolerated treatment well Patient left: in chair;with call bell/phone within reach;with family/visitor present Nurse Communication: Mobility status   GP     Kingsley Callander 11/24/2013, 11:43 AM  Kittie Plater, PT, DPT Pager #: [phone] Office #: [phone]

## 2013-11-24 NOTE — Progress Notes (Signed)
Pt is currently in the hospital for a craniotomy tumor excision.  Gay called to cancel his CT, and chemo dates on 2/6 and 2/13.  We will tentatively leave the f/u on 2/26 so he can assess pt status.  Gay is to call if he cannot keep this appt.  Dr Vista Mink aware.  SLJ

## 2013-11-25 DIAGNOSIS — C7931 Secondary malignant neoplasm of brain: Principal | ICD-10-CM

## 2013-11-25 DIAGNOSIS — Z515 Encounter for palliative care: Secondary | ICD-10-CM

## 2013-11-25 DIAGNOSIS — C3490 Malignant neoplasm of unspecified part of unspecified bronchus or lung: Secondary | ICD-10-CM

## 2013-11-25 DIAGNOSIS — Z66 Do not resuscitate: Secondary | ICD-10-CM | POA: Diagnosis present

## 2013-11-25 DIAGNOSIS — C7949 Secondary malignant neoplasm of other parts of nervous system: Principal | ICD-10-CM

## 2013-11-25 LAB — TROPONIN I: Troponin I: <0.3 ng/mL (ref <0.30)

## 2013-11-25 MED ORDER — DEXAMETHASONE 4 MG PO TABS
4.0000 mg | ORAL_TABLET | Freq: Three times a day (TID) | ORAL | Status: DC
  Administered 2013-11-25 – 2013-11-27 (×6): 4 mg via ORAL
  Filled 2013-11-25 (×8): qty 1

## 2013-11-25 MED ORDER — DIAZEPAM 5 MG PO TABS: 5.0000 mg | ORAL_TABLET | ORAL | Status: DC | PRN

## 2013-11-25 MED ORDER — ALPRAZOLAM 0.5 MG PO TABS: 0.5000 mg | ORAL_TABLET | Freq: Three times a day (TID) | ORAL | Status: DC | PRN

## 2013-11-25 MED ORDER — DILTIAZEM LOAD VIA INFUSION
10.0000 mg | Freq: Once | INTRAVENOUS | Status: AC
  Administered 2013-11-25: 10 mg via INTRAVENOUS
  Filled 2013-11-25: qty 10

## 2013-11-25 MED ORDER — SENNOSIDES-DOCUSATE SODIUM 8.6-50 MG PO TABS
1.0000 | ORAL_TABLET | Freq: Two times a day (BID) | ORAL | Status: DC
  Administered 2013-11-25 – 2013-11-26 (×3): 1 via ORAL
  Filled 2013-11-25 (×7): qty 1

## 2013-11-25 MED ORDER — DILTIAZEM HCL 25 MG/5ML IV SOLN: 10.0000 mg | Freq: Once | INTRAVENOUS | Status: DC

## 2013-11-25 MED ORDER — DILTIAZEM HCL ER 60 MG PO CP12
60.0000 mg | ORAL_CAPSULE | Freq: Two times a day (BID) | ORAL | Status: DC
  Administered 2013-11-25 – 2013-11-27 (×5): 60 mg via ORAL
  Filled 2013-11-25 (×9): qty 1

## 2013-11-25 MED ORDER — DILTIAZEM HCL 100 MG IV SOLR
5.0000 mg/h | INTRAVENOUS | Status: DC
  Administered 2013-11-25: 5 mg/h via INTRAVENOUS
  Administered 2013-11-25: 15 mg/h via INTRAVENOUS
  Filled 2013-11-25 (×3): qty 100

## 2013-11-25 MED ORDER — SODIUM CHLORIDE 0.9 % IV SOLN
INTRAVENOUS | Status: DC
  Administered 2013-11-25: 07:00:00 via INTRAVENOUS

## 2013-11-25 NOTE — Progress Notes (Signed)
Received consult to make arrangements for patient to be at home with hospice services. Pt was resting and no family was present in the pt's room. RN suggested that calling family would be best way to proceed.  I called pt's daughter, Cameron Lam, and spoke with her.  She had not yet spoken with the rest of the patient's family about plans for bringing patient home and wished to do that this evening.  She will speak with me again tomorrow about hospice agency and necessary DME after making all necessary decisions with her family.  I have confirmed that the information listed in EPIC for address and phone number are correct. Await call from her tomorrow to proceed with arrangements.

## 2013-11-25 NOTE — Progress Notes (Signed)
Subjective: Patient reports "I feel ok. No problem"  Objective: Vital signs in last 24 hours: Temp:  [97.2 F (36.2 C)-97.8 F (36.6 C)] 97.4 F (36.3 C) (02/03 0400) Pulse Rate:  [49-103] 77 (02/03 0700) Resp:  [13-25] 21 (02/03 0700) BP: (113-140)/(67-87) 121/67 mmHg (02/03 0700) SpO2:  [87 %-99 %] 90 % (02/03 0600) Weight:  [57.9 kg (127 lb 10.3 oz)] 57.9 kg (127 lb 10.3 oz) (02/03 0400)  Intake/Output from previous day: 02/02 0701 - 02/03 0700 In: 720 [P.O.:720] Out: 1050 [Urine:1050] Intake/Output this shift:    Alert, conversant. O2 in use with rate ~24, but pt denies dyspnea. HR in 150's.  AF/RVR onset during night. Incision without erythema, swelling, or drainage. PEARL. MAEW.   Lab Results:  Recent Labs  11/23/13 0500  WBC 37.8*  HGB 9.4*  HCT 30.6*  PLT 244   BMET  Recent Labs  11/23/13 0500  NA 130*  K 4.5  CL 88*  CO2 32  GLUCOSE 134*  BUN 30*  CREATININE 0.83  CALCIUM 8.5    Studies/Results: Dg Chest Port 1 View  11/23/2013   CLINICAL DATA:  Respiratory failure  EXAM: PORTABLE CHEST - 1 VIEW  COMPARISON:  DG CHEST 1V PORT dated 11/22/2013; CT CHEST W/CM dated 09/01/2013  FINDINGS: There is a right subclavian central venous catheter with the tip in unchanged position. Severe bilateral emphysematous changes. There is persistent right basilar airspace disease concerning for pneumonia. There is mild interval improvement left basilar airspace disease. There is an element of underlying chronic interstitial disease of the left lung base. There is no pneumothorax. The heart and mediastinal contours are unremarkable.  The osseous structures are unremarkable.  IMPRESSION: Persistent right basilar airspace disease concerning for pneumonia. There is mild interval improvement left basilar airspace disease. The residual left basilar airspace disease likely reflects an element of chronic interstitial disease with mild superimposed infiltrate.   Electronically Signed    By: Kathreen Devoid   On: 11/23/2013 11:24    Assessment/Plan:   LOS: 7 days  Improving from NS post-op standpoint; will request CCM assistance for mgt of AF/RVR development.   Cameron Lam 11/25/2013, 7:28 AM

## 2013-11-25 NOTE — Consult Note (Signed)
Patient Cameron Lam      DOB: Mar 01, 1934      VZC:[id number]     Consult Note from the Palliative Medicine Team at [facility] Requested by: Dr. 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 Craniotomy tumor excision 1/26 by Dr. Vertell Limber. Cameron Lam has not progressed well with persistent hypoxia and atrial fibrillation.   We met today with Cameron Lam and his wife and two daughters. We had a frank discussion about the natural trajectory of his disease processes and his current options. He agrees that he is too weak for continued aggressive treatment and their goals are to get him home. Family says that they have enough support to provide 24/7 care to meet his needs. Cameron Lam agrees with hospice involvement at home with a focus on comfort, quality, and dignity. We discussed what hospice can offer as far as help in the home, equipment, and symptom management. They understand that hospice can help meet his needs at this point without transferring back to the hospital. We did discuss the option of hospice facility if needed in the future. He agrees that he is too weak and that it would be too much for him to pursue aggressive treatment and even aggressive physical therapy at this point.    Goals of Care: 1.  Code Status: PARTIAL: Vasoactive/Antiarrhythmic drugs only   2. Scope of Treatment: We will begin to minimize interventions to transition to comfort care in the home.    4. Disposition: Home with hospice support.    3. Symptom Management:   1. Pain: Percocet and Morphine prn.  2. Bowel Regimen: Senna S scheduled and Ducolax supp prn.   3. Fever: Acetaminophen prn.  4. Nausea/Vomiting: Ondansetron prn.   4. Psychosocial: Emotional  support provided to patient and family during difficult conversation.    Brief HPI: 80 yo male with metastatic lung cancer   ROS: Denies pain, shortness of breath, nausea    PMH:  Past Medical History  Diagnosis Date  . Shingles outbreak 06/16/10    Lt anterior and postior thigh/buttocks  . Hypertension   . Tobacco abuse   . Lung cancer   . Hx of radiation therapy 6/26 - 05/07/13    LLL lung 37.5 gray in 15 fractions  . COPD (chronic obstructive pulmonary disease)   . Anemia      PSH: Past Surgical History  Procedure Laterality Date  . Video bronchoscopy Bilateral 04/10/2013    Procedure: VIDEO BRONCHOSCOPY WITH FLUORO;  Surgeon: Wilhelmina Mcardle, MD;  Location: [facility];  Service: Cardiopulmonary;  Laterality: Bilateral;  . Appendectomy    . Craniotomy N/A 11/18/2013    Procedure: CRANIOTOMY TUMOR EXCISION;  Surgeon: Erline Levine, MD;  Location: [facility];  Service: Neurosurgery;  Laterality: N/A;  Craniotomy for tumor excision   I have reviewed the FH and SH and  If appropriate update it with new information. No Known Allergies Scheduled Meds: . antiseptic oral rinse  15 mL Mouth Rinse BID  . dexamethasone  4 mg Intravenous Q8H  . diltiazem  60 mg Oral Q12H  . docusate sodium  100 mg Oral BID  . feeding supplement (ENSURE COMPLETE)  237 mL Oral BID BM  . ferrous  sulfate  325 mg Oral BID WC  . levETIRAcetam  500 mg Oral BID  . levofloxacin  500 mg Oral Daily  . multivitamin with minerals  1 tablet Oral Daily  . pantoprazole  40 mg Oral Q1200  . senna  1 tablet Oral BID   Continuous Infusions: . sodium chloride 20 mL/hr at 11/25/13 0704  . diltiazem (CARDIZEM) infusion 15 mg/hr (11/25/13 1000)   PRN Meds:.acetaminophen, acetaminophen, albuterol, bisacodyl, diazepam, HYDROcodone-acetaminophen, labetalol, methocarbamol, morphine injection, ondansetron (ZOFRAN) IV, ondansetron, oxyCODONE-acetaminophen, polyethylene glycol, prochlorperazine, promethazine    BP  116/79  Pulse 118  Temp(Src) 97.9 F (36.6 C) (Axillary)  Resp 21  Ht 5' 10"  (1.778 m)  Wt 57.9 kg (127 lb 10.3 oz)  BMI 18.32 kg/m2  SpO2 94%   PPS: 30%   Intake/Output Summary (Last 24 hours) at 11/25/13 1048 Last data filed at 11/25/13 1000  Gross per 24 hour  Intake 644.67 ml  Output   1050 ml  Net -405.33 ml   LBM: 2/1               Physical Exam:  General: Alert, awake, pleasant and cooperaive HEENT:  +Temporal muscle wasting, moist mucous membranes, no JVD Chest:  Exp wheezing, slight dyspnea during conversation CVS: Irreg - A fib Abdomen: Soft, NT, ND Ext: No edema, MAE Neuro: Alert, oriented person/place/situation  Labs: CBC    Component Value Date/Time   WBC 37.8* 11/23/2013 0500   WBC 14.2* 10/22/2013 1443   RBC 4.18* 11/23/2013 0500   RBC 4.00* 10/22/2013 1443   RBC 3.37* 04/05/2013 1727   HGB 9.4* 11/23/2013 0500   HGB 9.0* 10/22/2013 1443   HCT 30.6* 11/23/2013 0500   HCT 29.4* 10/22/2013 1443   PLT 244 11/23/2013 0500   PLT 632* 10/22/2013 1443   MCV 73.2* 11/23/2013 0500   MCV 73.6* 10/22/2013 1443   MCH 22.5* 11/23/2013 0500   MCH 22.5* 10/22/2013 1443   MCHC 30.7 11/23/2013 0500   MCHC 30.6* 10/22/2013 1443   RDW 19.9* 11/23/2013 0500   RDW 20.4* 10/22/2013 1443   LYMPHSABS 10.1* 10/22/2013 1443   LYMPHSABS 11.2* 04/06/2013 1154   MONOABS 1.0* 10/22/2013 1443   MONOABS 1.8* 04/06/2013 1154   EOSABS 0.1 10/22/2013 1443   EOSABS 0.0 04/06/2013 1154   BASOSABS 0.2* 10/22/2013 1443   BASOSABS 0.0 04/06/2013 1154    BMET    Component Value Date/Time   NA 130* 11/23/2013 0500   NA 138 10/22/2013 1443   K 4.5 11/23/2013 0500   K 3.8 10/22/2013 1443   CL 88* 11/23/2013 0500   CO2 32 11/23/2013 0500   CO2 22 10/22/2013 1443   GLUCOSE 134* 11/23/2013 0500   GLUCOSE 94 10/22/2013 1443   BUN 30* 11/23/2013 0500   BUN 10.8 10/22/2013 1443   CREATININE 0.83 11/23/2013 0500   CREATININE 0.8 10/22/2013 1443   CALCIUM 8.5 11/23/2013 0500   CALCIUM 8.9 10/22/2013 1443    GFRNONAA 83* 11/23/2013 0500   GFRAA >90 11/23/2013 0500    CMP     Component Value Date/Time   NA 130* 11/23/2013 0500   NA 138 10/22/2013 1443   K 4.5 11/23/2013 0500   K 3.8 10/22/2013 1443   CL 88* 11/23/2013 0500   CO2 32 11/23/2013 0500   CO2 22 10/22/2013 1443   GLUCOSE 134* 11/23/2013 0500   GLUCOSE 94 10/22/2013 1443   BUN 30* 11/23/2013 0500   BUN 10.8 10/22/2013 1443   CREATININE 0.83 11/23/2013 0500  CREATININE 0.8 10/22/2013 1443   CALCIUM 8.5 11/23/2013 0500   CALCIUM 8.9 10/22/2013 1443   PROT 6.9 10/22/2013 1443   PROT 6.5 06/26/2013 0939   ALBUMIN 3.6 10/22/2013 1443   ALBUMIN 3.6 06/26/2013 0939   AST 17 10/22/2013 1443   AST 20 06/26/2013 0939   ALT 7 10/22/2013 1443   ALT 13 06/26/2013 0939   ALKPHOS 81 10/22/2013 1443   ALKPHOS 153* 06/26/2013 0939   BILITOT 0.29 10/22/2013 1443   BILITOT 0.1* 06/26/2013 0939   GFRNONAA 83* 11/23/2013 0500   GFRAA >90 11/23/2013 0500     Time In Time Out Total Time Spent with Patient Total Overall Time  1000 1115 78mn 741m    Greater than 50%  of this time was spent counseling and coordinating care related to the above assessment and plan.  AlVinie SillNP Palliative Medicine Team Pager # [phone] Phone # [phone]

## 2013-11-25 NOTE — Progress Notes (Signed)
Pt's HR noted to increase to 140's-170s.  All other vital signs remain stable.  Pt only has c/o of headache, will given prn pain med.  MD (Deterding-CCM) notified and will place orders.  Will continue to monitor.

## 2013-11-25 NOTE — Consult Note (Signed)
Palliative Medicine Team Consult Note  Met with Mr. Daman, his wife and two daughters regarding goals of care. The patient himself embraces a desire for comfort, being at home and living out whatever time he has with the best possible quality of life. Given these goals I offered Hospice as a way for him to get home as soon as possible and to have a support system at home to help him meet those goals as his disease progresses. He and his family are agreeable. Prognostically he meets Hospice criteria with <6 months estimate-however with his current debility, failure to thrive and weakness I actually anticipate much less than that.His family are aware of just how serious things really are for him. They reiterated they want him at home-no facilities or intermediate rehab-they have a strong system of family and friends who help at home.    Transition him off IV diltiazem ASAP to oral  Transition to oral decadron  He was on multiple prn pain meds on admission. Will use oxycodone PO prn.  Home with Hospice Referral once on oral meds  PT only as tolerated for mobilty  Transition home as soon as possible.  Lane Hacker, DO Palliative Medicine

## 2013-11-25 NOTE — Progress Notes (Signed)
Pulmonary/Critical Care Progress Note   Name: Cameron Lam MRN: [id number] DOB: 10-03-1934    ADMISSION DATE:  11/18/2013 CONSULTATION DATE:  1/30  REFERRING MD :  Vertell Limber  PRIMARY SERVICE: Vertell Limber   CHIEF COMPLAINT:  Hypoxia   BRIEF PATIENT DESCRIPTION:  This is a 80 year old male w/ known h/o non-small cell lung cancer (squamous cell), presenting w/ LLL lung mass w/ mets to liver and newly diagnosed brain mets to the left cerebellum (he is s/p palliative XRT to the left lower lobe for obstructing mass), and currently on gemcitabine on days 1 and 8 every 3 weeks (last rx 12/18). Admitted on 1/26 for crani for tumor excision by dr Vertell Limber. PCCM asked to assess on 1/30 for persistent hypoxia.   SIGNIFICANT EVENTS / STUDIES:  1/26: CRANIOTOMY TUMOR EXCISION (N/A) - Craniotomy for tumor excision Suboccipital craniectomy with resection of posterior fossa tumor and microdissection 1/29 MR brain: . Interval gross total resection of left cerebellar cystic and enhancing mass. Improved patency of the fourth ventricle and mildly decreased cerebellar edema. 2. Small volume pneumocephalus. Postoperative blood products with associated susceptibility artifact. Artifact versus small volume  infarct along the posterior inferior resection cavity. 3. Small suboccipital postoperative fluid collection. 1/30 swallow eval >>>  LINES / TUBES: Right IJ CVL 1/26>>>  CULTURES: bc 1/30>>> UC 1/30>>>  ANTIBIOTICS: Zosyn 1/30>>>2/1 Levaquin PO 2/1>>>2/6  SUBJECTIVE:  A-fib with RVR overnight, spoke with palliative and now partial code, started on dilt drip.  VITAL SIGNS: Temp:  [97.4 F (36.3 C)-97.9 F (36.6 C)] 97.9 F (36.6 C) (02/03 0802) Pulse Rate:  [42-149] 140 (02/03 0915) Resp:  [16-31] 18 (02/03 0915) BP: (88-137)/(58-87) 111/72 mmHg (02/03 0915) SpO2:  [87 %-99 %] 94 % (02/03 0915) Weight:  [57.9 kg (127 lb 10.3 oz)] 57.9 kg (127 lb 10.3 oz) (02/03 0400) HEMODYNAMICS:   VENTILATOR  SETTINGS:   INTAKE / OUTPUT: Intake/Output     02/02 0701 - 02/03 0700 02/03 0701 - 02/04 0700   P.O. 720 125   I.V. (mL/kg)  24.7 (0.4)   IV Piggyback     Total Intake(mL/kg) 720 (12.4) 149.7 (2.6)   Urine (mL/kg/hr) 1050 (0.8)    Total Output 1050     Net -330 +149.7          PHYSICAL EXAMINATION: General:  Alert, awake and interactive.  Neuro:  Awake, oriented. HEENT:  Posterior dressing CD&I. No JVD  Cardiovascular:  rrr Lungs:  End exp wheezing.  Abdomen:  Non-tender + bowel sounds  Musculoskeletal:  Intact  Skin:  Intact   LABS:  CBC  Recent Labs Lab 11/21/13 1430 11/22/13 0530 11/23/13 0500  WBC 47.2* 41.1* 37.8*  HGB 9.7* 10.0* 9.4*  HCT 31.0* 32.0* 30.6*  PLT 255 265 244   Coag's No results found for this basename: APTT, INR,  in the last 168 hours BMET  Recent Labs Lab 11/21/13 1430 11/22/13 0530 11/23/13 0500  NA 127* 131* 130*  K 5.5* 4.8 4.5  CL 92* 89* 88*  CO2 25 30 32  BUN 19 22 30*  CREATININE 0.59 0.78 0.83  GLUCOSE 143* 118* 134*   Electrolytes  Recent Labs Lab 11/21/13 1430 11/22/13 0530 11/23/13 0500  CALCIUM 8.1* 9.1 8.5  MG 1.9 1.8 1.8  PHOS 1.9* 2.9 2.6   Sepsis Markers  Recent Labs Lab 11/21/13 1430 11/22/13 0535 11/23/13 0500  PROCALCITON 0.44 0.26 0.21   ABG  Recent Labs Lab 11/21/13 1359  PHART 7.448  PCO2ART 37.8  PO2ART 55.0*   Liver Enzymes No results found for this basename: AST, ALT, ALKPHOS, BILITOT, ALBUMIN,  in the last 168 hours Cardiac Enzymes  Recent Labs Lab 11/25/13 0610  TROPONINI <0.30   Glucose No results found for this basename: GLUCAP,  in the last 168 hours  Imaging Dg Chest Port 1 View  11/23/2013   CLINICAL DATA:  Respiratory failure  EXAM: PORTABLE CHEST - 1 VIEW  COMPARISON:  DG CHEST 1V PORT dated 11/22/2013; CT CHEST W/CM dated 09/01/2013  FINDINGS: There is a right subclavian central venous catheter with the tip in unchanged position. Severe bilateral emphysematous  changes. There is persistent right basilar airspace disease concerning for pneumonia. There is mild interval improvement left basilar airspace disease. There is an element of underlying chronic interstitial disease of the left lung base. There is no pneumothorax. The heart and mediastinal contours are unremarkable.  The osseous structures are unremarkable.  IMPRESSION: Persistent right basilar airspace disease concerning for pneumonia. There is mild interval improvement left basilar airspace disease. The residual left basilar airspace disease likely reflects an element of chronic interstitial disease with mild superimposed infiltrate.   Electronically Signed   By: Kathreen Devoid   On: 11/23/2013 11:24     CXR: significant increasein bibasilar infiltrates. Think significant component of atx, but can't exclude element of edema/PNA   ASSESSMENT / PLAN:  PULMONARY A: acute respiratory failure due to progressive atelectasis +/- PNA. He does endorse coughing w/ pos. Passed swallow evaluation now that is more awake.  P:   - Titrate O2 for sat of 88-92%, avoid sats of 100% given COPD. - Pulm hygiene as ordered. - KVO IVF. - Diuresed relatively well see below. - See ID section. - DNI.  CARDIOVASCULAR A: HTN, no onset a-fib with RVR P:  - Hold further lasix. - Tele. - Anti-HTN added and BP is now stable. - Dilt drip, will start PO today to attempt and get off the drip. - Anticoagulation when ok with NS.  RENAL A:  Mild hyponatremia on admit P:   - Hold further lasix, diuresed well. - BMET in AM. - Replace electrolytes as needed.  GASTROINTESTINAL A:   Possible dysphagia  P:   - Continue heart healthy diet since passed swallow evaluation.  HEMATOLOGIC A:  NSCLC (squamous cell) w/ mets to liver and brain       Chronic anemia       Leukocytosis: possible r/t steroids  P:  - PAS to LEs. - Trend CBC.  INFECTIOUS A:  Possible aspiration PNA  P:   - Empiric abx as ordered. - Changed  zosyn to levofloxacin for total of 8 days, stop date in place, last dose 2/6.  ENDOCRINE A:  Hyperglycemia  P:   - Check CBGs q 4. - Consider SSI if remains >150, on steroids.  NEUROLOGIC A: s/p crani for Metastatic squamous cell left cerebellar tumor   P:   - Decadron per neuro. - Post-op care as directed by neuro.  TODAY'S SUMMARY: Cardizem PO started, LCB status noted, once off drip can leave ICU, PCCM will follow for now.  I have personally obtained a history, examined the patient, evaluated laboratory and imaging results, formulated the assessment and plan and placed orders.  Rush Farmer, M.D. Pulmonary and [place] Pager: [phone]  11/25/2013, 9:56 AM

## 2013-11-25 NOTE — Progress Notes (Signed)
UR completed.  Evva Din, RN BSN MHA CCM Trauma/Neuro ICU Case Manager [phone]  

## 2013-11-25 NOTE — Progress Notes (Signed)
[facility] Progress Note Patient Name: Cameron Lam DOB: Mar 16, 1934 MRN: [id number]  Date of Service  11/25/2013   HPI/Events of Note  Patient with new onset AF/RVR with HR in the 160s to 170s.  HD stable with BP 128/78 (84).  O2 sats in the high 80s low 90s.   eICU Interventions  Plan: 12 lead EKG Cycle trop Dilt gtt 10 mg IV now and place on infusion   Intervention Category Intermediate Interventions: Arrhythmia - evaluation and management  Tayna Smethurst 11/25/2013, 6:10 AM

## 2013-11-25 NOTE — Progress Notes (Signed)
Per Chase Caller MD, notify MD for HR >150. CCMD notified. Velora Mediate

## 2013-11-26 ENCOUNTER — Telehealth: Payer: Self-pay

## 2013-11-26 ENCOUNTER — Telehealth: Payer: Self-pay | Admitting: Medical Oncology

## 2013-11-26 DIAGNOSIS — J189 Pneumonia, unspecified organism: Secondary | ICD-10-CM

## 2013-11-26 DIAGNOSIS — R51 Headache: Secondary | ICD-10-CM

## 2013-11-26 NOTE — Progress Notes (Signed)
Pulmonary/Critical Care Progress Note   Name: Cameron Lam MRN: [id number] DOB: January 31, 1934    ADMISSION DATE:  11/18/2013 CONSULTATION DATE:  1/30  REFERRING MD :  Vertell Limber  PRIMARY SERVICE: Vertell Limber   CHIEF COMPLAINT:  Hypoxia   BRIEF PATIENT DESCRIPTION:  This is a 80 year old male w/ known h/o non-small cell lung cancer (squamous cell), presenting w/ LLL lung mass w/ mets to liver and newly diagnosed brain mets to the left cerebellum (he is s/p palliative XRT to the left lower lobe for obstructing mass), and currently on gemcitabine on days 1 and 8 every 3 weeks (last rx 12/18). Admitted on 1/26 for crani for tumor excision by dr Vertell Limber. PCCM asked to assess on 1/30 for persistent hypoxia.   SIGNIFICANT EVENTS / STUDIES:  1/26: CRANIOTOMY TUMOR EXCISION (N/A) - Craniotomy for tumor excision Suboccipital craniectomy with resection of posterior fossa tumor and microdissection 1/29 MR brain: . Interval gross total resection of left cerebellar cystic and enhancing mass. Improved patency of the fourth ventricle and mildly decreased cerebellar edema. 2. Small volume pneumocephalus. Postoperative blood products with associated susceptibility artifact. Artifact versus small volume  infarct along the posterior inferior resection cavity. 3. Small suboccipital postoperative fluid collection. 1/30 swallow eval >>>  LINES / TUBES: Right IJ CVL 1/26>>>  CULTURES: bc 1/30>>>ng UC 1/30 > NEG  ANTIBIOTICS: Zosyn 1/30>>>2/1 Levaquin PO 2/1>>>2/6  SUBJECTIVE:  Comfortable on 5L O2, subjectively ready to go home, denies SOB and pain.   VITAL SIGNS: Temp:  [97.8 F (36.6 C)-98.3 F (36.8 C)] 98.2 F (36.8 C) (02/04 0500) Pulse Rate:  [50-128] 100 (02/04 0500) Resp:  [18-23] 20 (02/04 0500) BP: (90-135)/(50-88) 120/64 mmHg (02/04 0500) SpO2:  [87 %-100 %] 100 % (02/04 0500) Weight:  [57.9 kg (127 lb 10.3 oz)] 57.9 kg (127 lb 10.3 oz) (02/03 2133) HEMODYNAMICS:   VENTILATOR SETTINGS:   INTAKE  / OUTPUT: Intake/Output     02/03 0701 - 02/04 0700 02/04 0701 - 02/05 0700   P.O. 362    I.V. (mL/kg) 124.7 (2.2)    Total Intake(mL/kg) 486.7 (8.4)    Urine (mL/kg/hr) 725 (0.5)    Stool 1 (0)    Total Output 726     Net -239.3            PHYSICAL EXAMINATION: General:  Alert, awake and interactive.  Neuro:  Awake, oriented. HEENT:  Posterior dressing CD&I. No JVD  Cardiovascular:  rrr Lungs:  End exp wheezing.  Abdomen:  Non-tender + bowel sounds  Musculoskeletal:  Intact  Skin:  Intact   LABS:  CBC  Recent Labs Lab 11/21/13 1430 11/22/13 0530 11/23/13 0500  WBC 47.2* 41.1* 37.8*  HGB 9.7* 10.0* 9.4*  HCT 31.0* 32.0* 30.6*  PLT 255 265 244   Coag's No results found for this basename: APTT, INR,  in the last 168 hours BMET  Recent Labs Lab 11/21/13 1430 11/22/13 0530 11/23/13 0500  NA 127* 131* 130*  K 5.5* 4.8 4.5  CL 92* 89* 88*  CO2 25 30 32  BUN 19 22 30*  CREATININE 0.59 0.78 0.83  GLUCOSE 143* 118* 134*   Electrolytes  Recent Labs Lab 11/21/13 1430 11/22/13 0530 11/23/13 0500  CALCIUM 8.1* 9.1 8.5  MG 1.9 1.8 1.8  PHOS 1.9* 2.9 2.6   Sepsis Markers  Recent Labs Lab 11/21/13 1430 11/22/13 0535 11/23/13 0500  PROCALCITON 0.44 0.26 0.21   ABG  Recent Labs Lab 11/21/13 1359  PHART 7.448  PCO2ART  37.8  PO2ART 55.0*   Liver Enzymes No results found for this basename: AST, ALT, ALKPHOS, BILITOT, ALBUMIN,  in the last 168 hours Cardiac Enzymes  Recent Labs Lab 11/25/13 0610  TROPONINI <0.30   Glucose No results found for this basename: GLUCAP,  in the last 168 hours  Imaging No results found.   CXR: significant increasein bibasilar infiltrates. Think significant component of atx, but can't exclude element of edema/PNA   ASSESSMENT / PLAN:   Acute respiratory failure due to progressive atelectasis +/- PNA. He does endorse coughing w/ pos. Passed swallow evaluation now that is more awake.  - Titrate O2 for sat of  88-92%, avoid sats of 100% given COPD. - Diuresed relatively well see below. - Roxanol for SOB at home.   HTN, no onset a-fib with RVR - Hold further lasix. - PO Dilt. - Anticoagulation when ok with NSG  ? dysphagia  - Continue heart healthy diet since passed swallow evaluation.  Possible aspiration PNA  - Levofloxacin last dose 2/6.  Hyperglycemia  - Check CBGs q 4. - Consider SSI if remains >150, on steroids.  s/p crani for Metastatic squamous cell left cerebellar tumor   - Decadron per neuro. - Post-op care as directed by neuro.  TODAY'S SUMMARY: Maintaining NSR on PO dilt thus far. Breathing comfortably on 5L/min O2. Plan per palliative care and CSW is to d/c to home with home hospice. Being that he is comfortable on O2 and has maintained NSR overnight with PO dilt, he is cleared for discharge to home hospice from a PCCM standpoint.  PCCm to sign off  Kiandria Clum V. [phone]

## 2013-11-26 NOTE — Progress Notes (Signed)
Subjective: Patient reports feeling OK  Objective: Vital signs in last 24 hours: Temp:  [97.8 F (36.6 C)-98.3 F (36.8 C)] 98.2 F (36.8 C) (02/04 0500) Pulse Rate:  [50-149] 100 (02/04 0500) Resp:  [18-27] 20 (02/04 0500) BP: (90-135)/(50-88) 120/64 mmHg (02/04 0500) SpO2:  [87 %-100 %] 100 % (02/04 0500) Weight:  [57.9 kg (127 lb 10.3 oz)] 57.9 kg (127 lb 10.3 oz) (02/03 2133)  Intake/Output from previous day: 02/03 0701 - 02/04 0700 In: 486.7 [P.O.:362; I.V.:124.7] Out: 726 [Urine:725; Stool:1] Intake/Output this shift:    Physical Exam: Doing well neurologically, but still having episodes of HR to 150s.  Lab Results: No results found for this basename: WBC, HGB, HCT, PLT,  in the last 72 hours BMET No results found for this basename: NA, K, CL, CO2, GLUCOSE, BUN, CREATININE, CALCIUM,  in the last 72 hours  Studies/Results: No results found.  Assessment/Plan: D/C home when felt to be doing well from CCM/cardiac standpoint.    LOS: 8 days    Peggyann Shoals, MD 11/26/2013, 8:44 AM

## 2013-11-26 NOTE — Telephone Encounter (Signed)
Pt has requested that Dr L be his attending physician working w/the hospice team. I asked whether Dr Julien Nordmann might be more appropriate, but she stated that the pt/family does not want any further cancer treatment and really wants Dr L to be the attending. Dr L, please advise and also whether you want Hospice Drs to help w/Sx management. We can call the referral dept directly w/answer at [phone].

## 2013-11-26 NOTE — Discharge Summary (Signed)
Physician Discharge Summary  Patient ID: Cameron Lam MRN: [id number] DOB/AGE: Nov 19, 1933 80 y.o.  Admit date: 11/18/2013 Discharge date: 11/26/2013  Admission Diagnoses: Brain tumor   Discharge Diagnoses: Brain tumor s/p Craniotomy for tumor excision Suboccipital craniectomy with resection of posterior fossa tumor and microdissection   Active Problems:   Metastasis to brain   Aspiration pneumonia   COPD exacerbation   Hypoxemia   DNR (do not resuscitate)   Palliative care patient   Discharged Condition: stable  Hospital Course: Zaine Elsass was admitted for craniotomy for tumor resection following stereotactic radiosurgery of tumor.  Following uncomplicated craniotomy, pt was transferred to Neuro ICU, where he has improved steadily from post-op neurosurgical standpoint.  His oxygen saturation has dipped intermittently, requiring attention by Critical Care; and on 11/25/13, atrial fibrillation with rapid ventricular response was observed, requiring CCM's return.     Consults: pulmonary/intensive care  Significant Diagnostic Studies: radiology: MRI: Stealth MRI for surgical planning  Treatments: surgery: Craniotomy for tumor excision Suboccipital craniectomy with resection of posterior fossa tumor and microdissection   Discharge Exam: Blood pressure 120/64, pulse 100, temperature 98.2 F (36.8 C), temperature source Oral, resp. rate 20, height 5\' 10"  (1.778 m), weight 57.9 kg (127 lb 10.3 oz), SpO2 100.00%. Doing well neurologically, but still having episodes of HR to 150s. D/C home when felt to be doing well from CCM/cardiac standpoint.      Disposition: 01-Home or Self Care   Future Appointments Provider Department Dept Phone   12/18/2013 8:15 AM [facility] [phone]   12/18/2013 8:45 AM Curt Bears, [facility] [phone]   12/18/2013 9:15 AM [facility] [phone]   12/22/2013 9:15 AM Marye Round, [facility] [phone]       Medication List    ASK your doctor about these medications       albuterol 108 (90 BASE) MCG/ACT inhaler  Commonly known as:  PROVENTIL HFA;VENTOLIN HFA  Inhale 2 puffs into the lungs every 6 (six) hours as needed for wheezing.     amLODipine 10 MG tablet  Commonly known as:  NORVASC  Take 1 tablet (10 mg total) by mouth daily.     aspirin 81 MG tablet  Take 1 tablet (81 mg total) by mouth daily.     dexamethasone 4 MG tablet  Commonly known as:  DECADRON  Take 1 tablet (4 mg total) by mouth 4 (four) times daily.     feeding supplement (ENSURE COMPLETE) Liqd  Take 237 mLs by mouth 2 (two) times daily between meals.     ferrous sulfate 325 (65 FE) MG tablet  Take 325 mg by mouth 2 (two) times daily with a meal.     multivitamin tablet  Take 1 tablet by mouth daily.     prochlorperazine 10 MG tablet  Commonly known as:  COMPAZINE  Take 10 mg by mouth every 6 (six) hours as needed for nausea or vomiting.         Signed: Verdis Prime 11/26/2013, 8:58 AM

## 2013-11-26 NOTE — Telephone Encounter (Signed)
Palliative care team referred pt to Hospice . Will Dr Julien Nordmann be attending? Dr. Julien Nordmann agreed to be attending and [facility] notified.

## 2013-11-26 NOTE — Progress Notes (Signed)
Patient Cameron Lam      DOB: 1933/12/22      OOI:[id number]   Palliative Medicine Team at [facility] Progress Note    Subjective:  Patient awake.  States he has a little headache but is otherwise ok. His daughter Cameron Lam was at the bedside.  She affirms discharge plan is home with hospice.   Filed Vitals:   11/26/13 0500  BP: 120/64  Pulse: 100  Temp: 98.2 F (36.8 C)  Resp: 20   Physical exam:  General:   No acute distress, awake , alert PERRL, EOMi, mmm , nasal passages dry Chest decreased with distant breath sounds, no rhonchi CVS: tachy , S1, S2 Abd: soft, not tender, not distended Ext warm, Neuro: oriented to self , no distress   Assessment and plan: 80 yr old african [place] male s/p craniotomy for suboccipital tumor resection.  Family has decided to transition to home with hospice care.   1.  DNR 2.  Transition home with hospice, care management has been consulted. 3.  Headache: patient able to take po but don't know how long that will last.  Could consider discharge with Roxanol 20 mg/ml 5 mg sl q 2 hrs prn for pain or dyspnea dispense 30 ml.  Hospice can also assist with making this change when needed.     Total time 15 min, 730-745 am  Cameron Kutzer L. Lovena Le, MD MBA The Palliative Medicine Team at [facility] Phone: [phone] Pager: [phone]

## 2013-11-26 NOTE — Care Management Note (Signed)
   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 She selected [facility]. Mellody Memos RN notified.   Anticipated DC Date:  11/27/2013   Anticipated DC Plan:  HOME W [facility] CARE         Choice offered to / List presented to:  C-4 Adult Children        HH arranged  HH-1 RN  HH-4 NURSE'S AIDE  HH-6 SOCIAL WORKER      HH agency  [facility] [place]   Status of service:  Completed, signed off Medicare Important Message given?   (If response is "NO", the following Medicare IM given date fields will be blank) Date Medicare IM given:   Date Additional Medicare IM given:    Discharge Disposition:  [place]  Per UR Regulation:  Reviewed for med. necessity/level of care/duration of stay  If discussed at [place] of Stay Meetings, dates discussed:   11/25/2013    Comments:  11/26/2013 Met with pt daughter, Cameron Lam, re family wishes for home health services, they have selected [facility] [place] for home services. [name] RN of [facility] notified and was able to meet with Cameron Lam. This CM instructed to order Hospice Package A and Package B for delivery to home. Order placed and pt daughter Cameron Lam will serve as the contact person. Contact numbers are: Home: [phone] and cell: [phone].    11/25/2013 Sandi Mariscal, RN BSN M[facility] CCM 1632--Received consult to make arrangements for patient to be at home with hospice services. Pt was resting and no family was present in the pt's room. RN  suggested that calling family would be best way to proceed. I called pt's daughter, Cameron Lam, and spoke with her.  She had not yet spoken with the rest of the patient's family about plans for bringing patient home and wished to do that this evening.  She will speak with me again tomorrow about hospice agency and necessary DME after making all necessary decisions with her family.  I have confirmed that the information listed in EPIC for address and phone number are correct. Await call from her tomorrow to proceed with arrangements.

## 2013-11-26 NOTE — Progress Notes (Signed)
Notified by  [facility], patient and family request services of [facility]osp[facility] and Palliative Care of [facility]) after discharge.   Spoke with pt Cameron Lam and daughter Cameron Lam at bedside to initiate education related to hospice services, philosophy and team approach to care; they voiced good understanding of information provided. Cameron Lam informed her dad and family have decide on no further treatment; wanting to go home with hospice to focus on healing form current surgery and comfort. Plan is to discharge once arrangements are in place family hopeful for tomorrow- Thursday  -Daughter stated she plans to transport pt home by personal vehicle - O2 tavel tank will need to be brought to the room for d/c as pt is currently on 5 LNC continuous O2   -Daughter has questions about instructions for care of sutures (if needed upon discharge) or if these will be removed prior to d/c -*Please send completed GOLD DNR form home with pt  DME has been requested for delivery to the home later today: 1) Complete Oxygen Package B- which includes humidifier and nebulizer machine; currently on 5L [place] humidified O2 continuous and is receiving Nebulizer Tx prn;  2) Complete pkg A which includes: walker with wheels, fully electric hospital bed with half rails, AP&P mattress pad, over-bed table, 3n1 BSC, tub seat with back, and light weight wheelchair with foot rest  *Please contact daughter Cameron Lam @ h: [phone] or c: [phone] to arrange delivery time for later today DME will need to be in the home prior to pt discharge  Initial paperwork faxed to [facility]  *Completed d/c summary will need to be faxed to [place] @ [phone] when final Please notify [facility] when patient is ready to leave unit at d/c call [phone] (or [phone] if after 5 pm);  [facility] information and contact numbers also given to daughter Cameron Lam during visit.   Above information shared with [facility] Please call with any questions or concerns   [name], RN 11/26/2013, 1:41 PM [facility] Liaison [phone]

## 2013-11-27 NOTE — Progress Notes (Signed)
Subjective: Patient reports "I'm ready to go"  Objective: Vital signs in last 24 hours: Temp:  [97.5 F (36.4 C)-98.6 F (37 C)] 97.8 F (36.6 C) (02/05 0450) Pulse Rate:  [84-101] 86 (02/05 0450) Resp:  [17-18] 18 (02/05 0450) BP: (119-130)/(74-83) 130/78 mmHg (02/05 0450) SpO2:  [90 %-100 %] 90 % (02/05 0450) Weight:  [57.6 kg (126 lb 15.8 oz)] 57.6 kg (126 lb 15.8 oz) (02/04 2136)  Intake/Output from previous day: 02/04 0701 - 02/05 0700 In: 240 [P.O.:240] Out: 400 [Urine:400] Intake/Output this shift:    Alert, conversant, denying pain or discomfort. Incision without erythema, swelling ,or drainage. Hospice has accepted pt.   Lab Results: No results found for this basename: WBC, HGB, HCT, PLT,  in the last 72 hours BMET No results found for this basename: NA, K, CL, CO2, GLUCOSE, BUN, CREATININE, CALCIUM,  in the last 72 hours  Studies/Results: No results found.  Assessment/Plan: Stable, ready for d/c to home   LOS: 9 days  Per DrStern, d/c to home. Planned d/c yesterday delayed by coordination of DME at residence.    Verdis Prime 11/27/2013, 7:54 AM

## 2013-11-27 NOTE — Care Management Note (Signed)
   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 She selected [facility]. Mellody Memos RN notified.   Anticipated DC Date:  11/27/2013   Anticipated DC Plan:  HOME W HOSPICE CARE         PAC Choice  HOSPICE   Choice offered to / List presented to:  C-4 Adult Children        HH arranged  HH-1 RN  HH-4 NURSE'S AIDE  HH-6 SOCIAL WORKER      HH agency  [facility] [place]   Status of service:  Completed, signed off Medicare Important Message given?   (If response is "NO", the following Medicare IM given date fields will be blank) Date Medicare IM given:   Date Additional Medicare IM given:    Discharge Disposition:  [place]  Per UR Regulation:  Reviewed for med. necessity/level of care/duration of stay  If discussed at [place] of Stay Meetings, dates discussed:   11/25/2013    Comments:   11/27/13 Per pt RN, Cybil this pt daughter has called and all DME as ordered from [facility] has been deliver. Cybil will contact MD for d/c order and signature of DNR sheet. Jasmine Pang RN MPH, case manager, [phone]   11/26/2013 Met with pt daughter, Lew Prout, re family wishes for home health services, they have selected [facility] [place] for home services. [name] RN of [facility] notified and was able to meet with Ms Allbaugh. This CM instructed to order Hospice Package A and Package B for delivery to home. Order placed and pt daughter Ms KORBAN SHEARER will serve as the contact person. Contact numbers are:  Home: [phone] and cell: [phone]. Jasmine Pang RN MPH, case manager, [phone]   11/25/2013 Sandi Mariscal, RN BSN MHA CCM 1632--Received consult to make arrangements for patient to be at home with hospice services. Pt was resting and no family was present in the pt's room. RN suggested that calling family would be best way to proceed. I called pt's daughter, Abner Greenspan, and spoke with her.  She had not yet spoken with the rest of the patient's family about plans for bringing patient home and wished to do that this evening.  She will speak with me again tomorrow about hospice agency and necessary DME after making all necessary decisions with her family.  I have confirmed that the information listed in EPIC for address and phone number are correct. Await call from her tomorrow to proceed with arrangements.

## 2013-11-27 NOTE — Progress Notes (Signed)
Physical Therapy Treatment Patient Details Name: Cameron Lam MRN: [id number] DOB: [birth year] Today's Date: 11/27/2013 Time: 1000-1031 PT Time Calculation (min): 31 min  PT Assessment / Plan / Recommendation  History of Present Illness Cameron Lam, 79y.o. male, visits in follow up of brain MRI.  Lung CA dx June 2014, Hepatic & Spenic lesions discovered with f/u imaging late 2014, and brain mass Jan 2015 after pt c/o headaches & balance issues in Dec.  Chemo since June 2014. Pt is s/p craniotomy for tumor resection.    PT Comments   Patient's family demonstrates safe level of assist for mobility.  Educated in energy conservation techniques and on sequence/assist on stairs for best tolerance.  Will follow acutely until d/c.  Follow Up Recommendations  Supervision/Assistance - 24 hour;No PT follow up (due to Hospice care)     Does the patient have the potential to tolerate intense rehabilitation   N/A  Barriers to Discharge  None      Equipment Recommendations  Rolling walker with 5" wheels;Other (comment)    Recommendations for Other Services  None  Frequency Min 3X/week   Progress towards PT Goals Progress towards PT goals: Progressing toward goals  Plan Current plan remains appropriate    Precautions / Restrictions Precautions Precautions: Fall Restrictions Weight Bearing Restrictions: No   Pertinent Vitals/Pain C/o HA "same as it has been since I've been here"    Mobility  Bed Mobility Overal bed mobility: Modified Independent General bed mobility comments: impulsive, cues to slow down Transfers Overall transfer level: Needs assistance Equipment used: Rolling walker (2 wheeled) Transfers: Sit to/from Stand Sit to Stand: Supervision General transfer comment: cues to wait for assist due to imbalance Ambulation/Gait Ambulation Distance (Feet): 150 Feet Assistive device: Rolling walker (2 wheeled) Gait Pattern/deviations: Step-through pattern;Narrow base of  support General Gait Details: family member in room and asked her to assist him.  States she and his daughters will assist at home.  Educated on placement of hands for safety assisting with mobility and she return demonstrated appropriately.      Exercises Other Exercises Other Exercises: educated in energy conservation with handout for improved tolerance of activity at home. Other Exercises: educated family member in how to assist on stairs for home entry    PT Goals (current goals can now be found in the care plan section)    Visit Information  Last PT Received On: 11/27/13 Assistance Needed: +1 History of Present Illness: Cameron Lam, [phone]. male, visits in follow up of brain MRI.  Lung CA dx June 2014, Hepatic & Spenic lesions discovered with f/u imaging late 2014, and brain mass Jan 2015 after pt c/o headaches & balance issues in Dec.  Chemo since June 2014. Pt is s/p craniotomy for tumor resection.     Subjective Data      Cognition  Cognition Arousal/Alertness: Awake/alert Behavior During Therapy: Impulsive Overall Cognitive Status: Impaired/Different from baseline Safety/Judgement: Decreased awareness of safety;Decreased awareness of deficits    Balance  Balance Standing balance support: Bilateral upper extremity supported Standing balance-Leahy Scale: Poor Standing balance comment: imbalance unless has UE support  End of Session PT - End of Session Equipment Utilized During Treatment: Gait belt;Oxygen Activity Tolerance: Patient tolerated treatment well Patient left: in bed;with call bell/phone within reach   GP     [facility] 11/27/2013, 10:52 AM Magda Kiel, PT [phone] 11/27/2013

## 2013-11-27 NOTE — Telephone Encounter (Signed)
Yvette with Hospice is calling back to find out if Dr Joseph Art will accept to be attending physician and if so does he want any symptom management? Pt will be discharged today

## 2013-11-27 NOTE — Telephone Encounter (Signed)
Spoke with Hospice regarding Dr Joseph Art decision to decline accepting pt as attending. Dr Mahlon Gammon will also be out of the office for 3 weeks.

## 2013-11-27 NOTE — Consult Note (Signed)
I have reviewed and discussed the care of this patient in detail with the nurse practitioner including pertinent patient records, physical exam findings and data. I agree with details of this encounter.  

## 2013-11-27 NOTE — Progress Notes (Signed)
Patient discharged home with [facility]. Discharge instructions reviewed with daughter Abner Greenspan - including prescriptions and home O2. [facility] notified of pt's discharge. Central line removed this afternoon prior to D/C.   Aaron Edelman, Jeanie Mccard [place]

## 2013-11-27 NOTE — Telephone Encounter (Signed)
Spoke to Dr Lauenstein,concerning this, and Dr Joseph Art would prefer to have the oncologist Dr Earlie Server handle hospice, the oncologists are best suited to care for patients in hospice situation, and they will respect his choice not to have further treatments, if this is what he desires.

## 2013-11-28 ENCOUNTER — Ambulatory Visit: Payer: Medicare Other

## 2013-11-28 ENCOUNTER — Other Ambulatory Visit: Payer: Medicare Other

## 2013-11-28 LAB — CULTURE, BLOOD (ROUTINE X 2)
Culture: NO GROWTH
Culture: NO GROWTH

## 2013-12-05 ENCOUNTER — Ambulatory Visit: Payer: Medicare Other

## 2013-12-05 ENCOUNTER — Other Ambulatory Visit: Payer: Medicare Other

## 2013-12-12 ENCOUNTER — Other Ambulatory Visit: Payer: Self-pay | Admitting: *Deleted

## 2013-12-15 ENCOUNTER — Other Ambulatory Visit (HOSPITAL_COMMUNITY): Payer: Medicare Other

## 2013-12-15 ENCOUNTER — Ambulatory Visit (HOSPITAL_COMMUNITY): Payer: Medicare Other

## 2013-12-18 ENCOUNTER — Ambulatory Visit: Payer: Medicare Other

## 2013-12-18 ENCOUNTER — Other Ambulatory Visit: Payer: Medicare Other

## 2013-12-18 ENCOUNTER — Ambulatory Visit: Payer: Medicare Other | Admitting: Internal Medicine

## 2013-12-21 DEATH — deceased

## 2013-12-22 ENCOUNTER — Ambulatory Visit: Payer: Medicare Other | Admitting: Radiation Oncology

## 2014-06-17 ENCOUNTER — Other Ambulatory Visit: Payer: Self-pay | Admitting: Pharmacist

## 2015-01-21 IMAGING — CR DG CHEST 2V
2 series · 2 of 2 positions shown · non-contrast
Comparison: 04/06/2013

CLINICAL DATA: Bronchoscopy, 04/10/2013.  Hypertension.  Known left
lower lobe mass and consolidation.

CHEST - 2 VIEW

[w chest pa]
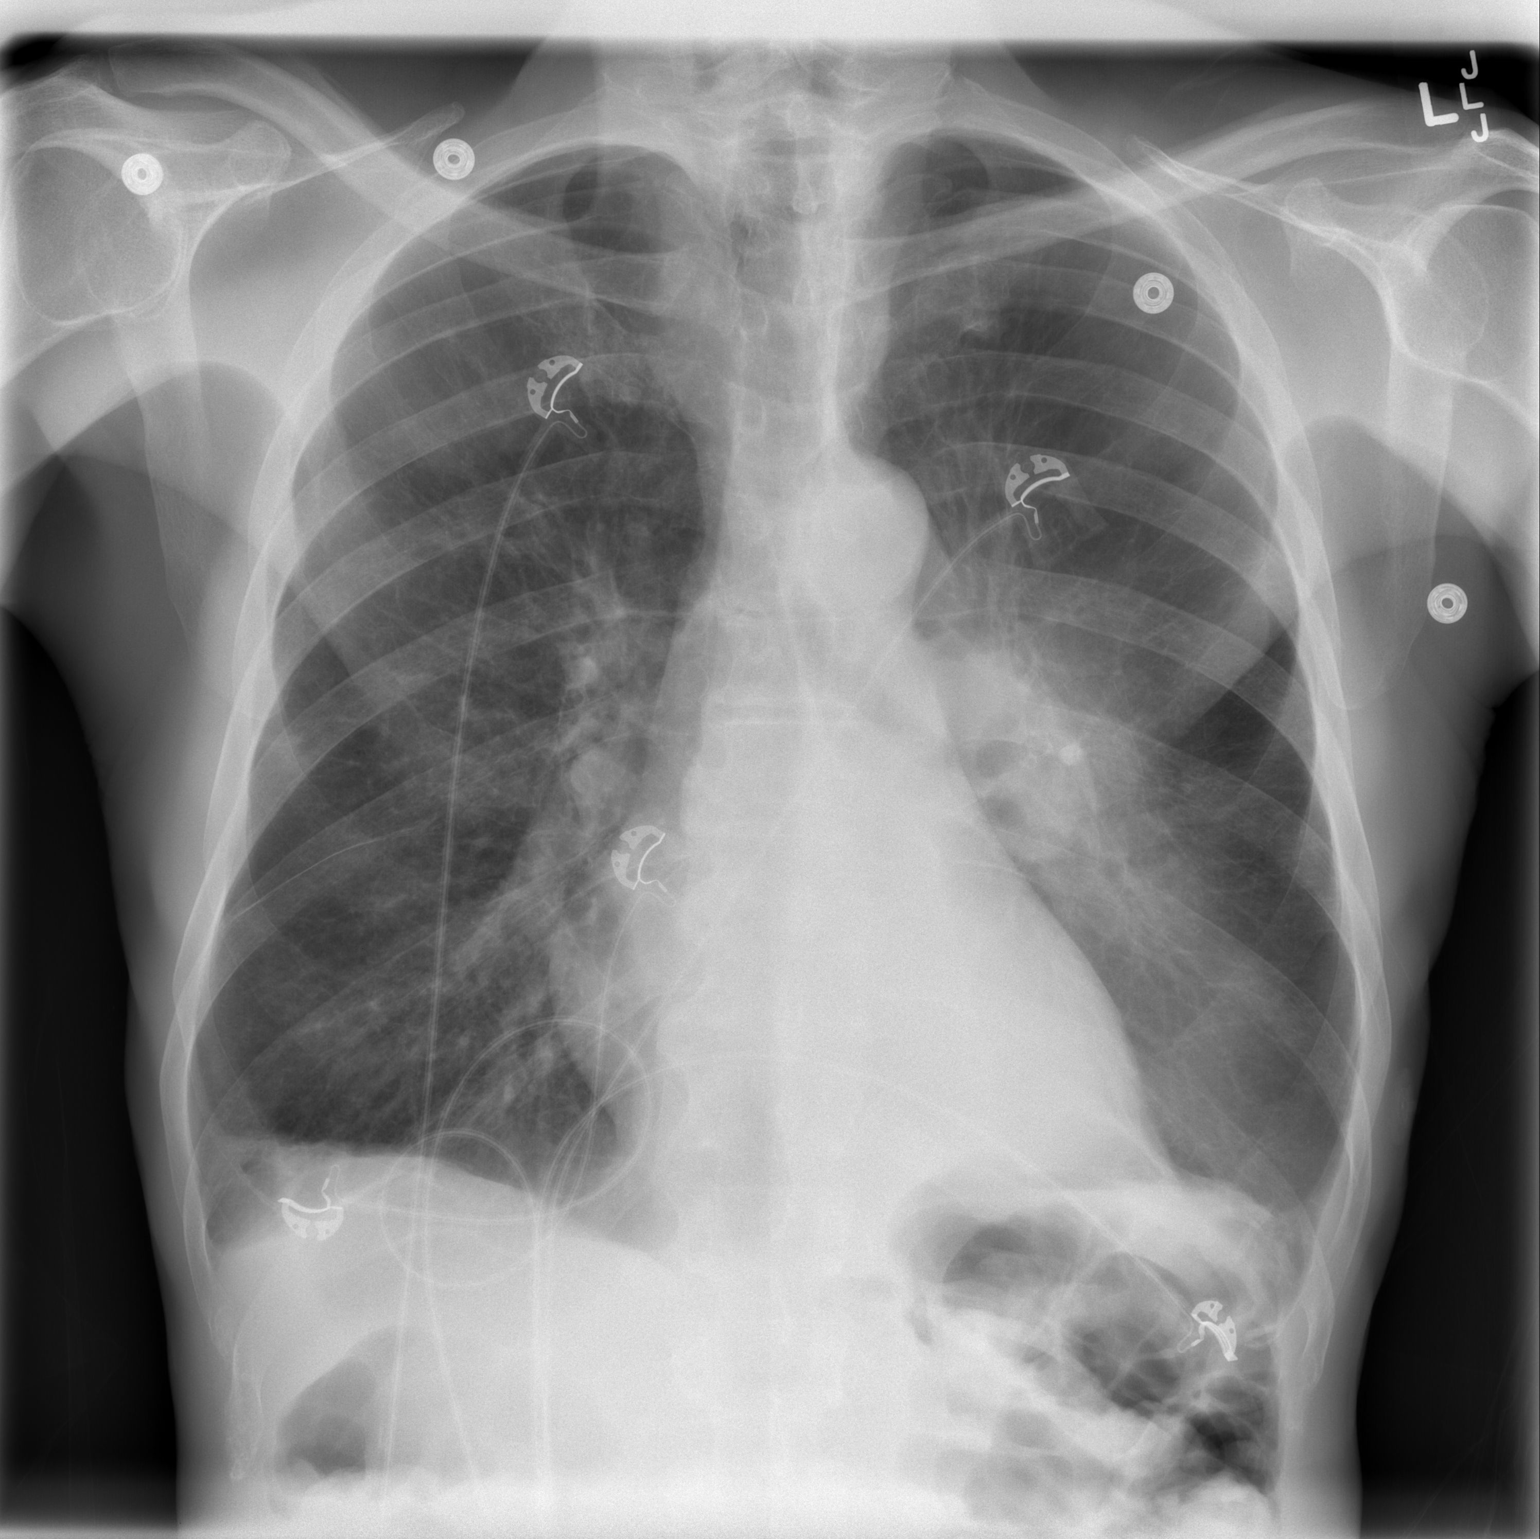

[w chest lat]
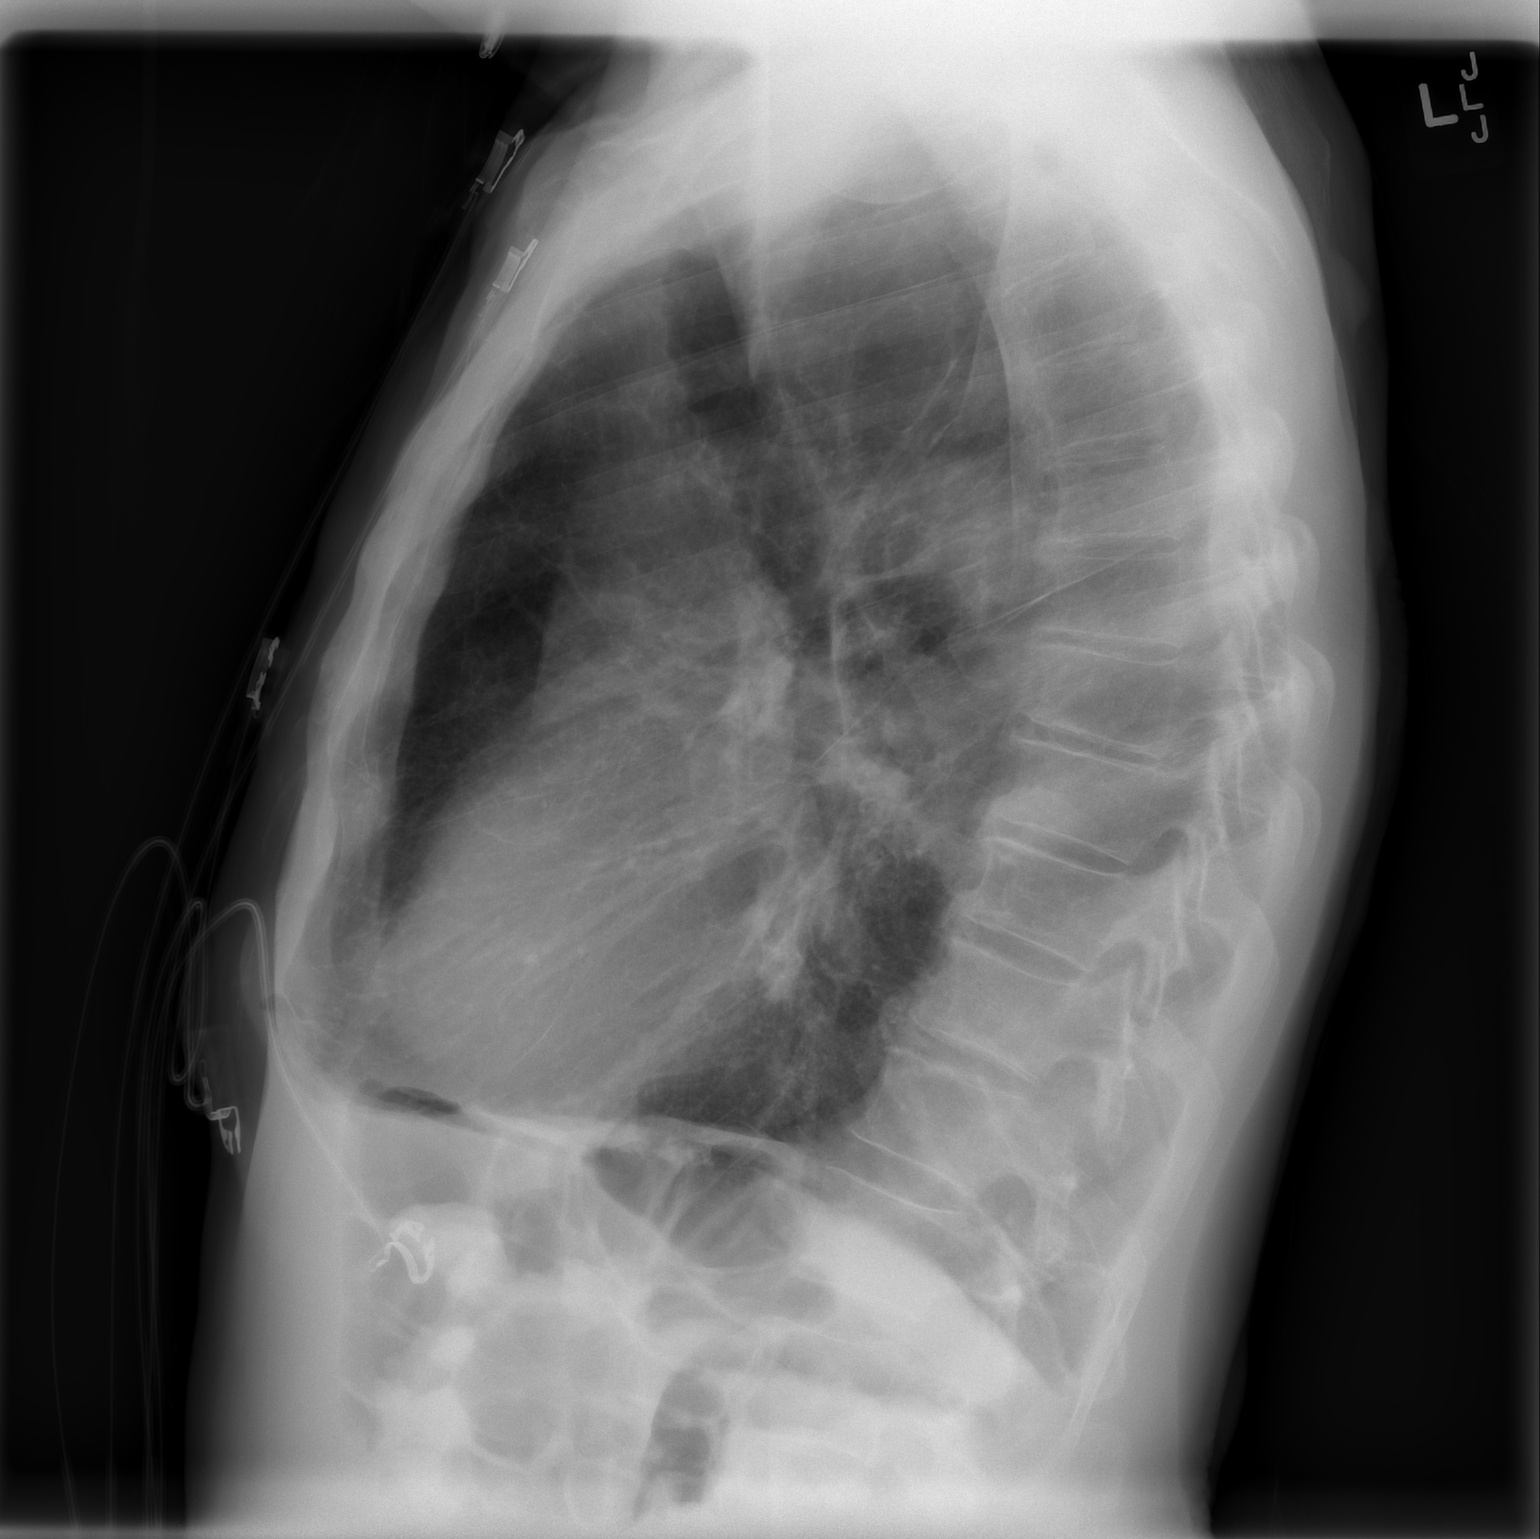

[2 of 2 positions shown; findings below may reference images not displayed]

FINDINGS: Mass-like fullness projects along the inferior left hilum
surrounded by hazy airspace opacity that is posterior on the
lateral view consistent with the left lower lobe consolidation and
mass noted on the recent CT.  There is a small left pleural
effusion.

The lungs are hyperexpanded reflecting COPD.  The lungs are
otherwise clear.  The cardiac silhouette is normal in size.  No
mediastinal or right hilar masses evident.
IMPRESSION: No acute findings.  No evidence of a complication following
bronchoscopy.  Left lower lobe central mass and associated lung
consolidation and small effusion is similar to the prior study.
Underlying COPD.

## 2015-01-24 IMAGING — CR DG CHEST 2V
3 series · 3 of 3 positions shown · non-contrast
Comparison: Prior chest x-ray 04/11/2013

CLINICAL DATA: Follow up pneumonia

CHEST - 2 VIEW

[w chest pa]
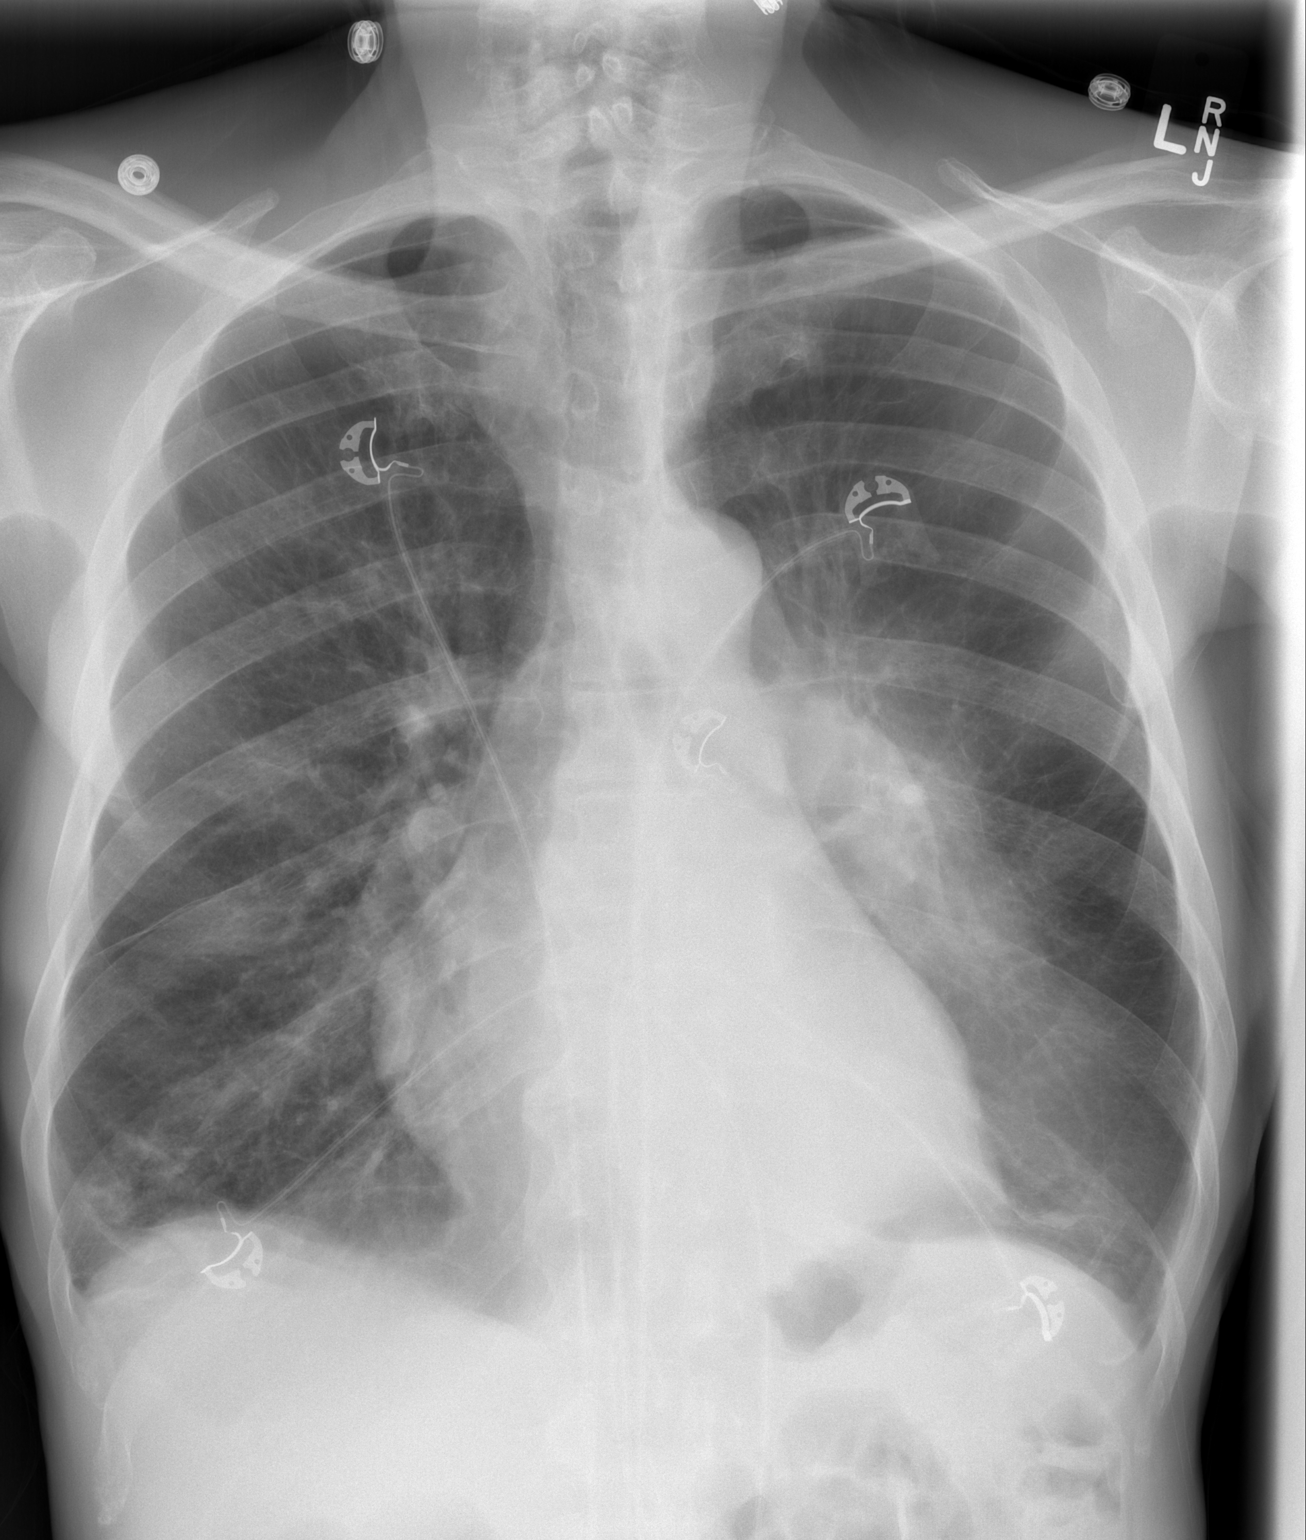

[w chest lat (1 of 2)]
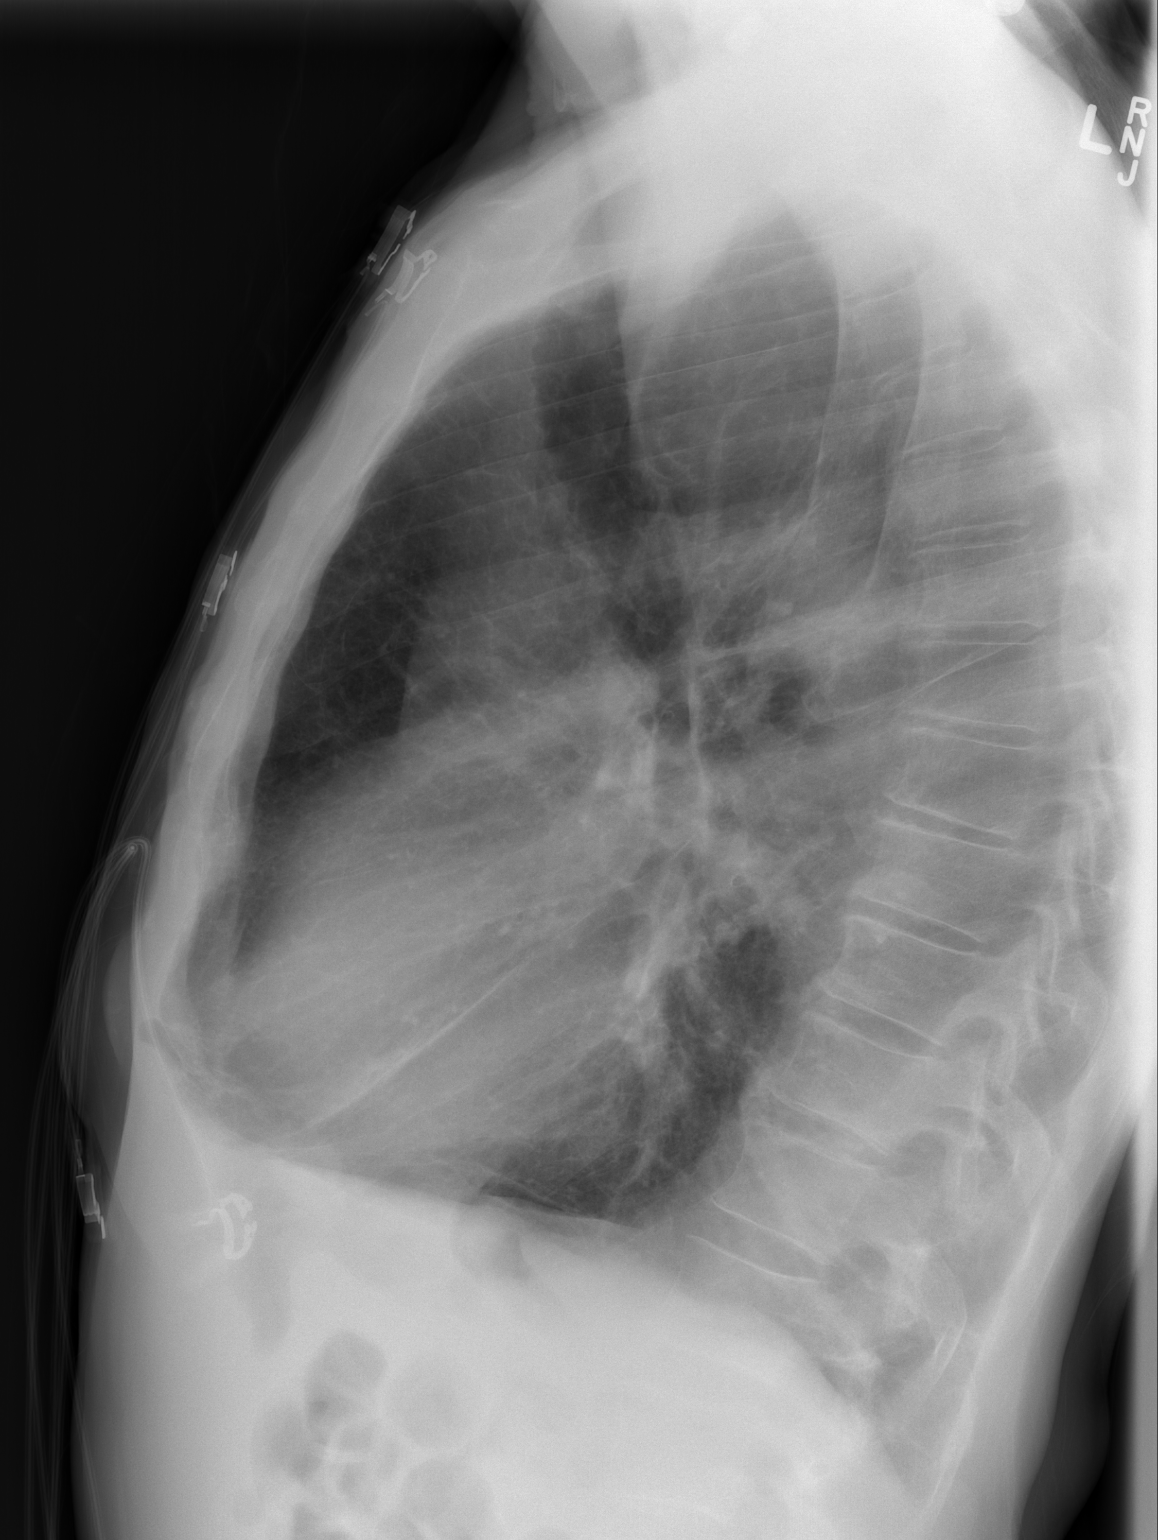

[w chest lat (2 of 2)]
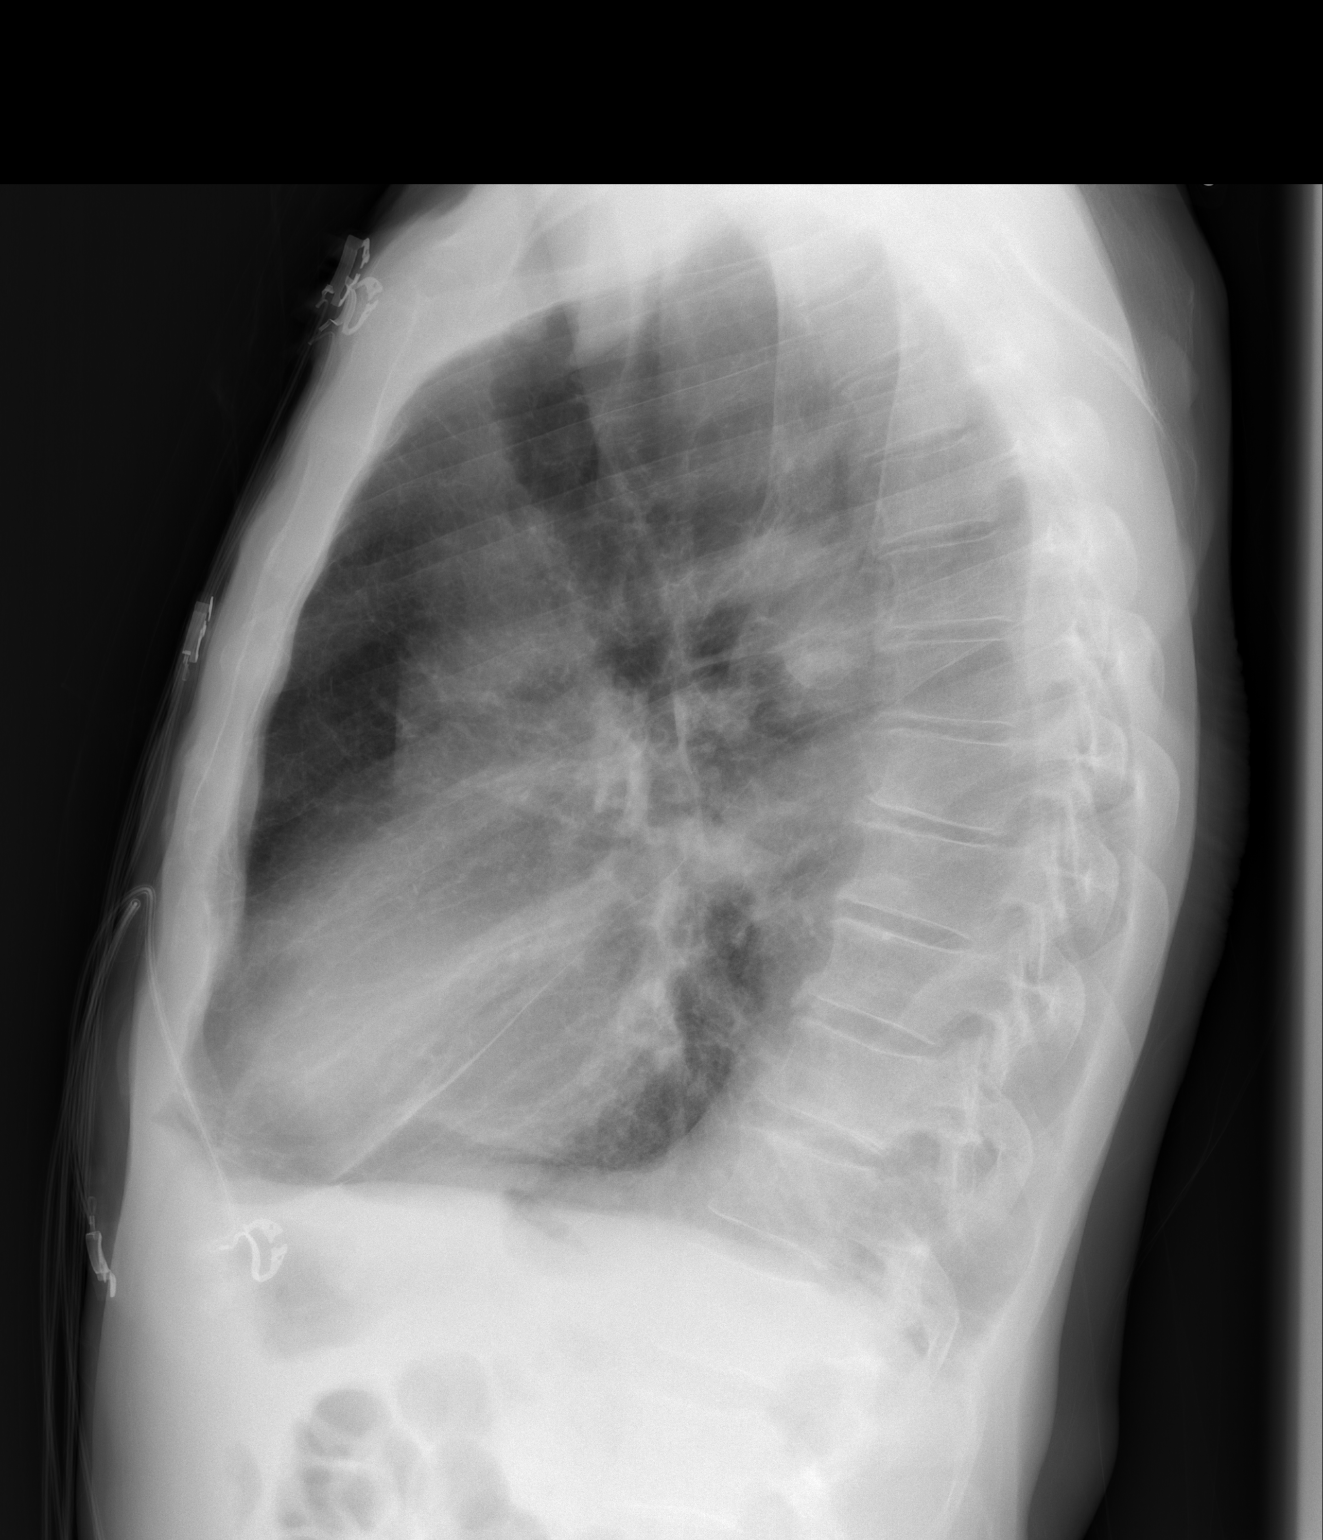

[3 of 3 positions shown; findings below may reference images not displayed]

FINDINGS: Persistent left lower lobe atelectasis.  The central
obstructing mass is better demonstrated on recent prior CT scan.
Developing patchy opacities in the right lower lobe.  The lungs
remain hyperexpanded with bronchitic and emphysematous changes.
Stable cardiomegaly. No acute osseous abnormality.
IMPRESSION: 1.  Developing patchy opacities in the right mid lung could reflect
pneumonia in the appropriate clinical setting.
2.  Stable appearance of known left central obstructing mass and
left lower lobe atelectasis.
3.  COPD and emphysema
